# Patient Record
Sex: Male | Born: 1965
Health system: Southern US, Community
[De-identification: ages and names within clinical notes are randomized; demographics above are authoritative.]

## PROBLEM LIST (undated history)

## (undated) DIAGNOSIS — I10 Essential (primary) hypertension: Secondary | ICD-10-CM

## (undated) DIAGNOSIS — E785 Hyperlipidemia, unspecified: Secondary | ICD-10-CM

## (undated) DIAGNOSIS — I4891 Unspecified atrial fibrillation: Secondary | ICD-10-CM

## (undated) DIAGNOSIS — G709 Myoneural disorder, unspecified: Secondary | ICD-10-CM

## (undated) DIAGNOSIS — K589 Irritable bowel syndrome without diarrhea: Secondary | ICD-10-CM

## (undated) DIAGNOSIS — G43909 Migraine, unspecified, not intractable, without status migrainosus: Secondary | ICD-10-CM

## (undated) DIAGNOSIS — K219 Gastro-esophageal reflux disease without esophagitis: Secondary | ICD-10-CM

## (undated) DIAGNOSIS — I639 Cerebral infarction, unspecified: Secondary | ICD-10-CM

## (undated) DIAGNOSIS — T7840XA Allergy, unspecified, initial encounter: Secondary | ICD-10-CM

## (undated) HISTORY — DX: Irritable bowel syndrome, unspecified: K58.9

## (undated) HISTORY — DX: Allergy, unspecified, initial encounter: T78.40XA

## (undated) HISTORY — PX: CARDIAC CATHETERIZATION: SHX172

## (undated) HISTORY — DX: Unspecified atrial fibrillation: I48.91

## (undated) HISTORY — DX: Migraine, unspecified, not intractable, without status migrainosus: G43.909

## (undated) HISTORY — DX: Myoneural disorder, unspecified: G70.9

## (undated) HISTORY — DX: Gastro-esophageal reflux disease without esophagitis: K21.9

## (undated) HISTORY — DX: Essential (primary) hypertension: I10

## (undated) HISTORY — DX: Hyperlipidemia, unspecified: E78.5

---

## 2000-12-20 ENCOUNTER — Encounter: Payer: Self-pay | Admitting: Emergency Medicine

## 2000-12-21 ENCOUNTER — Observation Stay (HOSPITAL_COMMUNITY): Admission: EM | Admit: 2000-12-21 | Discharge: 2000-12-22 | Payer: Self-pay | Admitting: Cardiology

## 2000-12-22 ENCOUNTER — Encounter: Payer: Self-pay | Admitting: Internal Medicine

## 2002-08-04 ENCOUNTER — Encounter: Payer: Self-pay | Admitting: Family Medicine

## 2002-08-04 ENCOUNTER — Ambulatory Visit (HOSPITAL_COMMUNITY): Admission: RE | Admit: 2002-08-04 | Discharge: 2002-08-04 | Payer: Self-pay | Admitting: Family Medicine

## 2003-04-13 ENCOUNTER — Ambulatory Visit (HOSPITAL_COMMUNITY): Admission: RE | Admit: 2003-04-13 | Discharge: 2003-04-13 | Payer: Self-pay | Admitting: Family Medicine

## 2003-06-02 ENCOUNTER — Inpatient Hospital Stay (HOSPITAL_COMMUNITY): Admission: EM | Admit: 2003-06-02 | Discharge: 2003-06-03 | Payer: Self-pay | Admitting: Emergency Medicine

## 2003-07-03 ENCOUNTER — Ambulatory Visit (HOSPITAL_COMMUNITY): Admission: RE | Admit: 2003-07-03 | Discharge: 2003-07-03 | Payer: Self-pay | Admitting: *Deleted

## 2004-10-30 ENCOUNTER — Inpatient Hospital Stay (HOSPITAL_COMMUNITY): Admission: EM | Admit: 2004-10-30 | Discharge: 2004-10-31 | Payer: Self-pay | Admitting: Emergency Medicine

## 2007-03-12 ENCOUNTER — Ambulatory Visit (HOSPITAL_COMMUNITY): Admission: RE | Admit: 2007-03-12 | Discharge: 2007-03-12 | Payer: Self-pay | Admitting: Family Medicine

## 2010-07-26 NOTE — Discharge Summary (Signed)
[place]  Patient:    Frank Silva, Frank Silva Visit Number: [id number] MRN: [id number]          Service Type: EMS Location: ED Attending Physician:  Herbert Seta Dictated by:   Lavella Hammock, P.A.-C. Admit Date:  12/20/2000 Discharge Date: 12/20/2000   CC:         Dr. Patrica Duel   Referring Physician Discharge [place]  DATE OF BIRTH:  Jul 13, 1965  PROCEDURES:  GXT Cardiolite.  HOSPITAL COURSE:  Mr. Sorbo is a 44 year old male with no known history of coronary artery disease who went to [facility] for orthostatic hypotension and substernal chest pain.  His EKG had no significant abnormalities but his symptoms were concerning for a cardiac origin, so he was transferred to [facility] for further evaluation and treatment.  He was found to be dehydrated with positive orthostatic vital signs and he was hydrated.  He had no further episodes of presyncope once he was hydrated and his orthostatics were negative.  Because of his chest pain, he was ruled out for an MI and his enzymes were negative.  He was scheduled for a stress Cardiolite which was done on December 22, 2000.  The patient exercised well and his Cardiolite showed no scar, no ischemia, and an EF of 61%.  Additionally, the patient was counselled about tobacco cessation.  He was very motivated to do this.  He stated he would also try to improve his eating habits.  The patient stated that because of his work he went for long periods just smoking without anything to eat or drink.  By December 22, 2000 the patients cholesterol profile had been checked and was within normal limits, and the patient was feeling much better.  He was considered stable for discharge on December 22, 2000.  LABORATORY DATA:  Stress Cardiolite:  No defects seen on the stress-only images to suggest prior infarction or ischemia with an EF of 61%.  Chest x-ray:  No acute abnormalities.  Laboratory  values:  Hemoglobin 15.1, hematocrit 45.5, wbcs 9.2, platelets 208.  Sodium 141, potassium 3.9, chloride 110, CO2 26, BUN 14, creatinine 1.2, glucose 107.  Serial CK-MB and troponin I negative for MI.  Total cholesterol 129, triglycerides 84, HDL 45, LDL 67.  TSH 0.817.  DISCHARGE CONDITION:  Improved.  CONSULTS:  None.  COMPLICATIONS:  None.  DISCHARGE DIAGNOSES: 1. Chest pain, associated with orthostatic symptoms. 2. Presyncope, orthostatic. 3. Dehydration. 4. Borderline hypokalemia, supplemented. 5. History of surgery secondary to a chainsaw accident on his left leg. 6. History of frequent premature ventricular contractions.  DISCHARGE INSTRUCTIONS:  ACTIVITY:  His activity level was not restricted.  DIET:  He is to stick to a low fat diet and drink eight glasses of water a day.  FOLLOW-UP:  He is to follow up with cardiology on a p.r.n. basis and to follow up with Dr. Nobie Putnam and call for an appointment.  DISCHARGE MEDICATIONS:  Multivitamin q.d. Dictated by:   Lavella Hammock, P.A.-C. Attending Physician:  Herbert Seta DD:  12/22/00 TD:  12/22/00 Job: [id number]

## 2010-07-26 NOTE — H&P (Signed)
NAME:  Frank Silva, Frank Silva               ACCOUNT NO.:  [id number]   MEDICAL RECORD NO.:  [id number]          PATIENT TYPE:  INP   LOCATION:  IC10                          FACILITY:  [facility]   PHYSICIAN:  Madelin Rear. Sherwood Gambler, MD  DATE OF BIRTH:  08/03/1965   DATE OF ADMISSION:  10/30/2004  DATE OF DISCHARGE:  LH                                HISTORY & PHYSICAL   CHIEF COMPLAINT:  Palpitations.   HISTORY OF PRESENT ILLNESS:  The patient presented to the emergency  department after sustaining some nausea and vomiting x3 at home.  This was  accompanied by a cold sweat, but no syncope.  He had no hematemesis,  hematochezia, melena or diarrhea except for just one loose stool.  He states  he suspects possible food poisoning as he ingested a hot dog at some  roadside place.  He is unaware of anybody else getting ill eating the same  foods.  He was not exposed to anybody with an active gastroenteritis that he  is aware of.   PAST MEDICAL HISTORY:  Status post recent cardiac catheterization with  normal coronary arteries for palpitations as well as unequivocal stress  Cardiolite study in the past.  He has had recurrent palpitations, although  apparently no arrhythmias were uncovered during his observation.  He was  maintained on aspirin therapy.  Past medical history is otherwise  noncontributory.   SOCIAL HISTORY:  He had stopped cigarette smoking approximately 1-2 years  ago per advise of his physician.  He has avoided caffeine and other cardiac  stimulant.  He is married and lives with his wife.  No illicit drug use.  Only occasional alcohol and no heavy use.   FOLLOW UP:  Noncontributory.   REVIEW OF SYSTEMS:  As under HPI.   PHYSICAL EXAMINATION:  SKIN:  Unremarkable.  HEENT:  No JVD or adenopathy.  NECK:  Supple.  CHEST:  Clear.  CARDIAC:  Irregularly, irregular rhythm without murmurs, rubs or gallops.  ABDOMEN:  Soft, no organomegaly or mass.  No rebound tenderness.  EXTREMITIES:   Without clubbing, cyanosis or edema.  NEUROLOGIC:  Nonfocal.   LABORATORY DATA AND X-RAY FINDINGS:  A 12-lead EKG performed in the  emergency department reveals rapid atrial fibrillation, left axis deviation  and complete right bundle branch block, but no acute ischemic or infarctive  changes.   Cardiac enzymes were negative.  Admission electrolytes revealed mild  hyponatremia, mild hypokalemia.  Apparently, a thyroid panel was not  obtained and ordered this morning and pending at present.   IMPRESSION:  New-onset atrial fibrillation, apparently paroxysmal with  normal coronary arteries and normal myocardium.  Anticoagulation is probably  not indicated under these circumstances, although the patient did receive  Lovenox.   PLAN:  Will consult cardiology.  He was admitted for rate control on calcium  channel drip (Cardizem).  Thyroid panel will be obtained.  Observation and  ICU to continue while on a Cardizem drip.      Madelin Rear. Sherwood Gambler, MD  Electronically Signed     LJF/MEDQ  D:  10/30/2004  T:  10/30/2004  Job:  [id number]

## 2010-07-26 NOTE — Discharge Summary (Signed)
NAME:  Frank Silva, Frank Silva                         ACCOUNT NO.:  [id number]   MEDICAL RECORD NO.:  [id number]                   PATIENT TYPE:  INP   LOCATION:  A214                                 FACILITY:  [facility]   PHYSICIAN:  Kirk Ruths, M.D.            DATE OF BIRTH:  27-Sep-1965   DATE OF ADMISSION:  06/02/2003  DATE OF DISCHARGE:  06/03/2003                                 DISCHARGE SUMMARY   DISCHARGE DIAGNOSES:  1. Palpitations.  2. EKG abnormality.   HOSPITAL COURSE:  This 45 year old white male smoker awoke the morning of  admission with a sick feeling with some minor palpitations. The patient  just did not feel right and presented to the emergency room where he was  found to have an occasional PVC on monitor, but EKG showed a Q wave in lead  I which was new from his previous EKGs; the last being May of 2004.  It was  felt possibly that this was a lead placement abnormality, but the patient  was admitted for a rule out MI and serial enzymes.  Chest x-ray was  negative.  The patient had no further palpitations or discomfort.  His  serial enzymes were negative x3.   The patient's repeat EKG on the following day had returned to his previous  pattern, which confirms in my mind that this was a lead placement  aberration; nevertheless, the patient has risk factors for heart disease.  He will be arranged for outpatient stress Cardiolite next week.  He is  stable at the time of discharge.  He is discharged home on his aspirin a day  and to quit smoking.     ___________________________________________                                         Kirk Ruths, M.D.   WMM/MEDQ  D:  06/03/2003  T:  06/04/2003  Job:  [id number]

## 2010-07-26 NOTE — Discharge Summary (Signed)
NAME:  LENIX, KIDD               ACCOUNT NO.:  [id number]   MEDICAL RECORD NO.:  [id number]          PATIENT TYPE:  INP   LOCATION:  IC10                          FACILITY:  [facility]   PHYSICIAN:  Madelin Rear. Sherwood Gambler, MD  DATE OF BIRTH:  [birth year]   DATE OF ADMISSION:  10/30/2004  DATE OF DISCHARGE:  08/24/2006LH                                 DISCHARGE SUMMARY   DISCHARGE MEDICATIONS:  1.  Cardizem CD 120 mg p.o. daily.  2.  Aspirin 81 mg p.o. daily.  3.  Prilosec 20 mg p.o. b.i.d.   DISCHARGE DIAGNOSES:  1.  Atrial fibrillation.  2.  Hypo-osmolality.  3.  Hypokalemia.   SUMMARY:  Patient was admitted for presentation to the emergency department  after sustaining nausea/vomiting x3, this was accompanied by diaphoresis, no  syncope.  He was aware of some palpitations at that time as well.  Past  medical history is pertinent for cardiac cath with normal coronary arteries  which was part of his arrhythmia workup as well as an unequivocal stress  Cardiolite study in the past.  He was found to be in new onset atrial  fibrillation, apparently paroxysmal.  Anticoagulation was not indicated  secondary to lack of comorbidities.  He was seen by cardiology.  He  converted spontaneously to sinus rhythm and remained asymptomatic with  normal cardiac enzymes.  Discharge condition stable.  Follow up in office.      Madelin Rear. Sherwood Gambler, MD  Electronically Signed     LJF/MEDQ  D:  11/10/2004  T:  11/10/2004  Job:  [id number]

## 2010-07-26 NOTE — H&P (Signed)
NAME:  Frank Silva, Frank Silva                         ACCOUNT NO.:  [id number]   MEDICAL RECORD NO.:  [id number]                   PATIENT TYPE:  INP   LOCATION:  A214                                 FACILITY:  [facility]   PHYSICIAN:  Kirk Ruths, M.D.            DATE OF BIRTH:  30-Jan-1966   DATE OF ADMISSION:  06/02/2003  DATE OF DISCHARGE:                                HISTORY & PHYSICAL   CHIEF COMPLAINT:  Palpitations.   HISTORY OF PRESENT ILLNESS:  This is a 45 year old white male smoker who  awoke this morning with not feeling well, complaining of palpitations.  The  patient subsequently had some diarrhea and felt weak.  The patient presented  to the emergency room where a chest x-ray was normal.  He was count to have  occasional PAC's.  He was found to have some slight changes in his EKG,  specifically Q wave in 1 which is new.  I feel like there is a lead  placement aberration, but with the patient's history and smoking history,  family history, I elected to admit the patient for rule out myocardial  infarction.   PAST MEDICAL HISTORY:  The patient had a three-day hospitalization at [facility] in 2002 for rule out myocardial infarction with normal stress  Cardiolites at that time.  The patient has continued to smoke.   MEDICATIONS:  He only takes an aspirin daily.   ALLERGIES:  No known allergies.   REVIEW OF SYSTEMS:  He denies nausea, vomiting, diaphoresis, shortness of  breath, chest pain.   PHYSICAL EXAMINATION:  GENERAL: A well-developed, well-nourished white male,  in no acute distress.  VITAL SIGNS:  He is afebrile.  Blood pressure 130/90, respirations 20 and  unlabored.  HEENT:  TM's are normal.  Pupils equal to light and accommodation.  Oropharynx is benign.  NECK:  Supple without JVD, bruits or thyromegaly.  LUNGS:  Clear in all areas.  HEART:  Regular sinus rhythm without murmur, gallop or rub.  ABDOMEN:  Soft, nontender.  EXTREMITIES:  Without  clubbing, cyanosis or edema.  NEUROLOGIC EXAMINATION:  Grossly intact.   ASSESSMENT:  1. Palpitations.  2. EKG change, probably secondary to lead placement.  3. Smoking history.  4. Family history of cardiac disease.    ___________________________________________                                         Kirk Ruths, M.D.   WMM/MEDQ  D:  06/02/2003  T:  06/02/2003  Job:  [id number]

## 2010-07-26 NOTE — Cardiovascular Report (Signed)
NAME:  Frank Silva, Frank Silva                         ACCOUNT NO.:  [id number]   MEDICAL RECORD NO.:  [id number]                   PATIENT TYPE:  OIB   LOCATION:  2899                                 FACILITY:  [facility]   PHYSICIAN:  Darlin Priestly, M.D.             DATE OF BIRTH:  06/24/1965   DATE OF PROCEDURE:  07/03/2003  DATE OF DISCHARGE:                              CARDIAC CATHETERIZATION   PROCEDURES:  1. Left heart catheterization.  2. Coronary angiography.  3. Left ventriculogram.   ATTENDING PHYSICIAN:  Darlin Priestly, M.D.   COMPLICATIONS:  None.   INDICATIONS:  Mr. Hyams is a 45 year old male patient of Dr. Regino Schultze with  history of remote tobacco use and alcohol use with intermittent  palpitations.  He ultimately was seen in the emergency room with normal EKG.  However, he did undergo a treadmill Cardiolite on June 13, 2003 with  evidence of mild anterior wall ischemia with apical thinning and normal EF.  He is now referred for cardiac catheterization to definitively rule out  coronary artery disease.   DESCRIPTION OF OPERATION:  After obtaining informed written consent, the  patient was brought to the cardiac catheterization laboratory.  Right and  left groin were  shaved and prepped and draped in the usual sterile fashion.  ECG monitor was established.  Using the modified Seldinger technique, a 6  French arterial sheath was inserted in the right femoral artery. A 6 French  diagnostic catheter was then used to performed diagnostic angiography.  This  reveals a large left main with no significant disease.  The LAD is a large  vessel that coursed to the apex and gives rise to one diagonal branch and  one large septal perforator.  The LAD has no significant disease.  The first  diagonal is a medium-size vessel with no significant disease.   Left circumflex is a large vessel which courses in AV groove and goes to two  obtuse marginal branches as well as a PDA and is  noted to be co-dominant.  The AV groove circumflex has no significant disease.   The first OM is a large vessel which bifurcates distally and has no  significant disease.  The second OM is a medium size vessel with no  significant disease.   The PDA is a medium size vessel with no significant disease.  in the AV  groove circ.   The right coronary artery is a medium size vessel which is noted to be co-  dominant and gives rise to a small PDA and posterior lateral branch.  There  is no significant disease in the RCA, PDA and posterior lateral branch.   LEFT VENTRICULOGRAM:  Left ventriculogram reveals preserved EF of 60%.   HEMODYNAMICS:  Systemic arterial pressure 139/72, LV pressure 130/11, LVEDP  of 24.   CONCLUSIONS:  1. No significant coronary artery disease.  2. Normal left ventricular systolic function.  3.  Elevated LVEDP.                                               Darlin Priestly, M.D.    RHM/MEDQ  D:  07/03/2003  T:  07/03/2003  Job:  [id number]   cc:   Kirk Ruths, M.D.  [address]  [place]  [place] [address]  Fax: [phone]

## 2011-08-05 ENCOUNTER — Telehealth: Payer: Self-pay | Admitting: Family Medicine

## 2011-08-05 ENCOUNTER — Ambulatory Visit: Payer: Self-pay | Admitting: Family Medicine

## 2011-08-05 VITALS — BP 160/79 | HR 69 | Temp 97.7°F | Resp 18 | Ht 68.0 in | Wt 168.0 lb

## 2011-08-05 DIAGNOSIS — M25519 Pain in unspecified shoulder: Secondary | ICD-10-CM

## 2011-08-05 MED ORDER — TRAMADOL HCL 50 MG PO TABS: 100.0000 mg | ORAL_TABLET | Freq: Four times a day (QID) | ORAL | Status: DC | PRN

## 2011-08-05 MED ORDER — TRAMADOL HCL 50 MG PO TABS: 50.0000 mg | ORAL_TABLET | Freq: Three times a day (TID) | ORAL | Status: DC | PRN

## 2011-08-05 NOTE — Telephone Encounter (Signed)
Dr. Georgiana Shore,  Patient called back stating that he normally takes several Tramadol at a time instead of 1 every 8 hours.  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  Can he take more than the rx'd amount?  Please advise.

## 2011-08-05 NOTE — Telephone Encounter (Signed)
Changed as requested and ndew rx sent

## 2011-08-05 NOTE — Telephone Encounter (Signed)
Patient would like a call back from Dr. Georgiana Shore regarding his rx that was written for Tramadol. He states he would like for it to be changed to take 1-2 every 4 hours instead of twice a day.

## 2011-08-05 NOTE — Progress Notes (Signed)
  Subjective:    Patient ID: Frank Silva, male    DOB: [birth year], 46 y.o.   MRN: [id number]  HPI 46 yo male here with shoulder pain. H/O injury to left rhomboids/periscapular muscles.  Flares every now and then.  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  Cant take vicodin, celebrex or flexeril.  Tramadol has helped well in the past.    Review of Systems Negative except as per HPI     Objective:   Physical Exam  Constitutional: He appears well-developed.  Pulmonary/Chest: Effort normal.  Musculoskeletal:       TTP left rhomboids. FroM shoulder.   Neurological: He is alert.          Assessment & Plan:  Shoulder pain - tramadol.  If no improvement, consider prednisone.

## 2011-08-07 NOTE — Telephone Encounter (Signed)
LMOM that Rx was changed and sent to pharm

## 2011-12-15 ENCOUNTER — Ambulatory Visit: Payer: Self-pay | Admitting: Family Medicine

## 2011-12-15 VITALS — BP 128/74 | HR 72 | Temp 98.0°F | Resp 16 | Ht 68.0 in | Wt 167.0 lb

## 2011-12-15 DIAGNOSIS — M546 Pain in thoracic spine: Secondary | ICD-10-CM

## 2011-12-15 DIAGNOSIS — M549 Dorsalgia, unspecified: Secondary | ICD-10-CM

## 2011-12-15 MED ORDER — TRAMADOL HCL 50 MG PO TABS: 100.0000 mg | ORAL_TABLET | Freq: Four times a day (QID) | ORAL | Status: DC | PRN

## 2011-12-15 MED ORDER — CYCLOBENZAPRINE HCL 5 MG PO TABS: 5.0000 mg | ORAL_TABLET | Freq: Every evening | ORAL | Status: DC | PRN

## 2011-12-15 NOTE — Patient Instructions (Signed)
Thoracic Strain  You have injured the muscles or tendons that attach to the upper part of your back behind your chest. This injury is called a thoracic strain, thoracic sprain, or mid-back strain.   CAUSES   The cause of thoracic strain varies. A less severe injury involves pulling a muscle or tendon without tearing it. A more severe injury involves tearing (rupturing) a muscle or tendon. With less severe injuries, there may be little loss of strength. Sometimes, there are breaks (fractures) in the bones to which the muscles are attached. These fractures are rare, unless there was a direct hit (trauma) or you have weak bones due to osteoporosis or age. Longstanding strains may be caused by overuse or improper form during certain movements. Obesity can also increase your risk for back injuries. Sudden strains may occur due to injury or not warming up properly before exercise. Often, there is no obvious cause for a thoracic strain.  SYMPTOMS   The main symptom is pain, especially with movement, such as during exercise.  DIAGNOSIS   Your caregiver can usually tell what is wrong by taking an X-ray and doing a physical exam.  TREATMENT    Physical therapy may be helpful for recovery. Your caregiver can give you exercises to do or refer you to a physical therapist after your pain improves.   After your pain improves, strengthening and conditioning programs appropriate for your sport or occupation may be helpful.   Always warm up before physical activities or athletics. Stretching after physical activity may also help.   Certain over-the-counter medicines may also help. Ask your caregiver if there are medicines that would help you.  If this is your first thoracic strain injury, proper care and proper healing time before starting activities should prevent long-term problems. Torn ligaments and tendons require as long to heal as broken bones. Average healing times may be only 1 week for a mild strain. For torn muscles  and tendons, healing time may be up to 6 weeks to 2 months.  HOME CARE INSTRUCTIONS    Apply ice to the injured area. Ice massages may also be used as directed.   Put ice in a plastic bag.   Place a towel between your skin and the bag.   Leave the ice on for 15 to 20 minutes, 3 to 4 times a day, for the first 2 days.   Only take over-the-counter or prescription medicines for pain, discomfort, or fever as directed by your caregiver.   Keep your appointments for physical therapy if this was prescribed.   Use wraps and back braces as instructed.  SEEK IMMEDIATE MEDICAL CARE IF:    You have an increase in bruising, swelling, or pain.   Your pain has not improved with medicines.   You develop new shortness of breath, chest pain, or fever.   Problems seem to be getting worse rather than better.  MAKE SURE YOU:    Understand these instructions.   Will watch your condition.   Will get help right away if you are not doing well or get worse.  Document Released: 05/17/2003 Document Revised: 05/19/2011 Document Reviewed: 04/12/2010  [employer] Patient Information 2013 [employer].

## 2011-12-15 NOTE — Progress Notes (Signed)
This is a 46 y.o.male who injured upper left back after throwing softball a week ago.  He drives a truck.    Patient has a past history of upper back pain for which she   Frank Silva denies any urinary symptoms, bowel problems, numbness in the legs, loss of motor power. Frank Silva had no fever.  Frank Silva has tried advil.  No past medical history on file.   No past surgical history on file.  Objective:  middle-aged male in no acute distress. Blood pressure 128/74, pulse 72, temperature 98 F (36.7 C), temperature source Oral, resp. rate 16, height 5\' 8"  (1.727 m), weight 167 lb (75.751 kg).Body mass index is 25.39 kg/(m^2). Palpation of the back reveals   Inspection of the back: Reveals no scoliosis  Motor exam of lower extremity: No abnormal weakness.  Reflexes: Symmetric and normal  Skin exam: normal  Assessment/Plan: Acute upper back pain without acute neurological findings.

## 2011-12-19 ENCOUNTER — Telehealth: Payer: Self-pay

## 2011-12-19 ENCOUNTER — Ambulatory Visit (INDEPENDENT_AMBULATORY_CARE_PROVIDER_SITE_OTHER): Payer: Self-pay | Admitting: Family Medicine

## 2011-12-19 VITALS — BP 138/80 | HR 68 | Temp 97.7°F | Resp 16 | Ht 68.0 in | Wt 167.0 lb

## 2011-12-19 DIAGNOSIS — M549 Dorsalgia, unspecified: Secondary | ICD-10-CM

## 2011-12-19 DIAGNOSIS — M545 Low back pain, unspecified: Secondary | ICD-10-CM

## 2011-12-19 NOTE — Telephone Encounter (Signed)
I would advise that the patient first have the dog evaluated urgently by a veterinarian. There are several 24 hour office locally. (TL please tell patient that.)  Per Dr. Milus Glazier we are not refilling the tramadol.

## 2011-12-19 NOTE — Telephone Encounter (Signed)
Please advise if we can replace this for him.

## 2011-12-19 NOTE — Telephone Encounter (Signed)
Patent states that tramadol was torn apart by his dog. Request refill. States he is a [employer] and will be going out of town tomorrow so would like it done before then if possible. [phone], [phone]

## 2011-12-19 NOTE — Progress Notes (Signed)
46 yo with back pain.  Lost Rx for tramadol  Rx refilled

## 2011-12-20 ENCOUNTER — Telehealth: Payer: Self-pay | Admitting: Radiology

## 2011-12-20 NOTE — Telephone Encounter (Signed)
Called patient left VM and advised we can not replace this medication for him.

## 2011-12-20 NOTE — Telephone Encounter (Signed)
Patient called back, advised Dr L has already replaced the Tramadol Rx for him.

## 2012-01-02 ENCOUNTER — Other Ambulatory Visit: Payer: Self-pay | Admitting: Family Medicine

## 2012-02-13 ENCOUNTER — Other Ambulatory Visit: Payer: Self-pay | Admitting: Physician Assistant

## 2012-02-16 ENCOUNTER — Other Ambulatory Visit: Payer: Self-pay | Admitting: Family Medicine

## 2012-03-15 ENCOUNTER — Encounter (HOSPITAL_COMMUNITY): Payer: Self-pay

## 2012-03-15 ENCOUNTER — Emergency Department (INDEPENDENT_AMBULATORY_CARE_PROVIDER_SITE_OTHER)
Admission: EM | Admit: 2012-03-15 | Discharge: 2012-03-15 | Disposition: A | Discharge status: Home / Self Care | Payer: Self-pay | Source: Home / Self Care

## 2012-03-15 ENCOUNTER — Emergency Department (INDEPENDENT_AMBULATORY_CARE_PROVIDER_SITE_OTHER): Payer: Self-pay

## 2012-03-15 DIAGNOSIS — J209 Acute bronchitis, unspecified: Secondary | ICD-10-CM

## 2012-03-15 DIAGNOSIS — J069 Acute upper respiratory infection, unspecified: Secondary | ICD-10-CM

## 2012-03-15 DIAGNOSIS — F17200 Nicotine dependence, unspecified, uncomplicated: Secondary | ICD-10-CM

## 2012-03-15 DIAGNOSIS — R05 Cough: Secondary | ICD-10-CM

## 2012-03-15 DIAGNOSIS — R059 Cough, unspecified: Secondary | ICD-10-CM

## 2012-03-15 LAB — CBC WITH DIFFERENTIAL/PLATELET
Basophils Absolute: 0 10*3/uL (ref 0.0–0.1)
Basophils Relative: 0 % (ref 0–1)
Eosinophils Absolute: 0.1 10*3/uL (ref 0.0–0.7)
Eosinophils Relative: 1 % (ref 0–5)
HCT: 47.1 % (ref 39.0–52.0)
Hemoglobin: 16.6 g/dL (ref 13.0–17.0)
Lymphocytes Relative: 35 % (ref 12–46)
Lymphs Abs: 2 10*3/uL (ref 0.7–4.0)
MCH: 32.5 pg (ref 26.0–34.0)
MCHC: 35.2 g/dL (ref 30.0–36.0)
MCV: 92.2 fL (ref 78.0–100.0)
Monocytes Absolute: 1 10*3/uL (ref 0.1–1.0)
Monocytes Relative: 17 % — ABNORMAL HIGH (ref 3–12)
Neutro Abs: 2.6 10*3/uL (ref 1.7–7.7)
Neutrophils Relative %: 46 % (ref 43–77)
Platelets: 136 10*3/uL — ABNORMAL LOW (ref 150–400)
RBC: 5.11 MIL/uL (ref 4.22–5.81)
RDW: 13 % (ref 11.5–15.5)
WBC: 5.7 10*3/uL (ref 4.0–10.5)

## 2012-03-15 MED ORDER — GUAIFENESIN-CODEINE 100-10 MG/5ML PO SYRP: 5.0000 mL | ORAL_SOLUTION | Freq: Three times a day (TID) | ORAL | Status: DC | PRN

## 2012-03-15 MED ORDER — PREDNISONE 10 MG PO TABS: ORAL_TABLET | ORAL | Status: DC

## 2012-03-15 MED ORDER — ALBUTEROL SULFATE HFA 108 (90 BASE) MCG/ACT IN AERS: 2.0000 | INHALATION_SPRAY | Freq: Four times a day (QID) | RESPIRATORY_TRACT | Status: DC | PRN

## 2012-03-15 MED ORDER — ALBUTEROL SULFATE (5 MG/ML) 0.5% IN NEBU
2.5000 mg | INHALATION_SOLUTION | Freq: Once | RESPIRATORY_TRACT | Status: AC
  Administered 2012-03-15: 2.5 mg via RESPIRATORY_TRACT

## 2012-03-15 MED ORDER — AMOXICILLIN-POT CLAVULANATE 875-125 MG PO TABS: 1.0000 | ORAL_TABLET | Freq: Two times a day (BID) | ORAL | Status: DC

## 2012-03-15 MED ORDER — ALBUTEROL SULFATE (5 MG/ML) 0.5% IN NEBU
INHALATION_SOLUTION | RESPIRATORY_TRACT | Status: AC
  Filled 2012-03-15: qty 0.5

## 2012-03-15 MED ORDER — IPRATROPIUM BROMIDE 0.02 % IN SOLN
0.5000 mg | Freq: Once | RESPIRATORY_TRACT | Status: AC
  Administered 2012-03-15: 0.5 mg via RESPIRATORY_TRACT

## 2012-03-15 NOTE — ED Provider Notes (Signed)
History   CSN: [id number]  Arrival date & time 03/15/12  1734  Chief Complaint  Patient presents with  . URI   The history is provided by the patient. No language interpreter was used.   Pt having chronic cough and chest congestion.  Cough has been nonproductive.  Pt has been wheezing and coughing.  Pt having chills but not much fever.  Pt has history of reactive airway disease.  Pt is a smoker but hasn't smoked in several days.    History reviewed. No pertinent past medical history.  History reviewed. No pertinent past surgical history.  No family history on file.  History  Substance Use Topics  . Smoking status: Current Every Day Smoker    Types: Cigarettes  . Smokeless tobacco: Not on file  . Alcohol Use: No    Review of Systems  Respiratory: Positive for cough, chest tightness, shortness of breath and wheezing. Negative for apnea, choking and stridor.   All other systems reviewed and are negative.   Allergies  Flexeril; Vicodin; and Celebrex  Home Medications   Current Outpatient Rx  Name  Route  Sig  Dispense  Refill  . CYCLOBENZAPRINE HCL 5 MG PO TABS   Oral   Take 1 tablet (5 mg total) by mouth at bedtime and may repeat dose one time if needed.   30 tablet   1   . DILTIAZEM HCL 120 MG PO TABS   Oral   Take 120 mg by mouth daily.         . ASPIRIN 81 MG PO TABS   Oral   Take 81 mg by mouth daily.         . TRAMADOL HCL 50 MG PO TABS      TAKE TWO TABLETS BY MOUTH EVERY 6 HOURS AS NEEDED FOR PAIN   60 tablet   0    BP 157/96  Pulse 84  Temp 98.9 F (37.2 C) (Oral)  Resp 18  SpO2 95%  Physical Exam  Nursing note and vitals reviewed. Constitutional: He is oriented to person, place, and time. He appears well-developed and well-nourished. No distress.  HENT:  Head: Normocephalic and atraumatic.  Mouth/Throat: Oropharyngeal exudate present.  Eyes: EOM are normal. Pupils are equal, round, and reactive to light.  Neck: Normal range of motion.  Neck supple.  Cardiovascular: Normal rate, regular rhythm and normal heart sounds.   Pulmonary/Chest: Effort normal. No respiratory distress. He has wheezes. He has rales. He exhibits tenderness.  Abdominal: Soft. Bowel sounds are normal.  Musculoskeletal: Normal range of motion. He exhibits no edema.  Neurological: He is alert and oriented to person, place, and time.  Skin: Skin is warm and dry.  Psychiatric: He has a normal mood and affect. His behavior is normal. Judgment and thought content normal.   ED Course  Procedures (including critical care time)  Labs Reviewed - No data to display No results found.  No diagnosis found.  MDM  IMPRESSION  URI  Possible Pneumonia  HTN   History of cardiac arrhythmia  Pleuritic Chest Pain  Cough (nonproductive)  RECOMMENDATIONS / PLAN Chest Xray PA/Lateral: reviewed xray and saw radiologist interpretation : consistent with acute bronchitis; no pneumonia seen Strongly encouraged smoking cessation Albuterol HFA - 2 puffs every 3 hours prn cough and wheeze Augmentin 875 mg po BID x 10 days Check CBC with diff Prednisone 10 mg take 3 tabs po daily for 7 days Robitussin AC cough syrup prn cough congestion  FOLLOW  UP 2 weeks   The patient was given clear instructions to go to ER or return to medical center if symptoms don't improve, worsen or new problems develop.  The patient verbalized understanding.  The patient was told to call to get lab results if they haven't heard anything in the next week.            Cleora Fleet, MD 03/15/12 1902

## 2012-03-15 NOTE — ED Notes (Signed)
Complains of congestion, cough shortness of breath. No appetite chills at times . Ribs are sore.

## 2012-03-17 ENCOUNTER — Telehealth (HOSPITAL_COMMUNITY): Payer: Self-pay

## 2012-04-23 ENCOUNTER — Ambulatory Visit: Payer: Self-pay | Admitting: Emergency Medicine

## 2012-04-23 VITALS — BP 136/77 | HR 96 | Temp 97.8°F | Resp 18 | Ht 68.0 in | Wt 172.2 lb

## 2012-04-23 DIAGNOSIS — G5680 Other specified mononeuropathies of unspecified upper limb: Secondary | ICD-10-CM

## 2012-04-23 MED ORDER — NAPROXEN SODIUM 550 MG PO TABS: 550.0000 mg | ORAL_TABLET | Freq: Two times a day (BID) | ORAL | Status: AC

## 2012-04-23 MED ORDER — METAXALONE 800 MG PO TABS: 800.0000 mg | ORAL_TABLET | Freq: Three times a day (TID) | ORAL | Status: DC

## 2012-04-23 MED ORDER — TRAMADOL HCL 50 MG PO TABS: ORAL_TABLET | ORAL | Status: DC

## 2012-04-23 NOTE — Progress Notes (Signed)
[facility] [address] [place] [address] [phone]  Date:  04/23/2012   Name:  Frank Silva   DOB:  May 12, 1965   MRN:  [id number]  PCP:  No primary provider on file.    Chief Complaint: Shoulder Pain and Back Pain   History of Present Illness:  Frank Silva is a 47 y.o. very pleasant male patient who presents with the following:  Injured back around left shoulder blade lowering the landing gear on his tractor trailer and has pain.  No direct injury.  No neurological symptoms or radiation.  No improvement in pain with OTC medication. Denies other complaints.    There is no problem list on file for this patient.   Past Medical History  Diagnosis Date  . Allergy   . Neuromuscular disorder     History reviewed. No pertinent past surgical history.  History  Substance Use Topics  . Smoking status: Current Every Day Smoker    Types: Cigarettes  . Smokeless tobacco: Not on file  . Alcohol Use: No    No family history on file.  Allergies  Allergen Reactions  . Flexeril (Cyclobenzaprine) Other (See Comments)  . Vicodin (Hydrocodone-Acetaminophen) Rash  . Celebrex (Celecoxib)   . Peanut-Containing Drug Products Hives    Breathing     Medication list has been reviewed and updated.  Current Outpatient Prescriptions on File Prior to Visit  Medication Sig Dispense Refill  . albuterol (PROVENTIL HFA;VENTOLIN HFA) 108 (90 BASE) MCG/ACT inhaler Inhale 2 puffs into the lungs every 6 (six) hours as needed for wheezing.  1 Inhaler  2  . aspirin 81 MG tablet Take 81 mg by mouth daily.      Marland Kitchen diltiazem (CARDIZEM) 120 MG tablet Take 120 mg by mouth daily.      Marland Kitchen amoxicillin-clavulanate (AUGMENTIN) 875-125 MG per tablet Take 1 tablet by mouth 2 (two) times daily.  20 tablet  0  . guaiFENesin-codeine (ROBITUSSIN AC) 100-10 MG/5ML syrup Take 5 mLs by mouth 3 (three) times daily as needed for cough (caution may cause drowsiness).  120 mL  0  .  predniSONE (DELTASONE) 10 MG tablet Take 3 tabs po daily for 7 days  21 tablet  0  . traMADol (ULTRAM) 50 MG tablet TAKE TWO TABLETS BY MOUTH EVERY 6 HOURS AS NEEDED FOR PAIN  60 tablet  0   No current facility-administered medications on file prior to visit.    Review of Systems:  As per HPI, otherwise negative.    Physical Examination: Filed Vitals:   04/23/12 1447  BP: 136/77  Pulse: 96  Temp: 97.8 F (36.6 C)  Resp: 18   Filed Vitals:   04/23/12 1447  Height: 5\' 8"  (1.727 m)  Weight: 172 lb 3.2 oz (78.109 kg)   Body mass index is 26.19 kg/(m^2). Ideal Body Weight: Weight in (lb) to have BMI = 25: 164.1   GEN: WDWN, NAD, Non-toxic, Alert & Oriented x 3 HEENT: Atraumatic, Normocephalic.  Ears and Nose: No external deformity. EXTR: No clubbing/cyanosis/edema NEURO: Normal gait.  PSYCH: Normally interactive. Conversant. Not depressed or anxious appearing.  Calm demeanor.  BACK:  Tender medial to angle of scapula on left.  Moderate spasm.  Neuro intact Assessment and Plan: Scapulocostal syndrome Anaprox Flexeril Local heat  Carmelina Dane, MD

## 2014-04-30 ENCOUNTER — Inpatient Hospital Stay (HOSPITAL_COMMUNITY): Payer: BLUE CROSS/BLUE SHIELD

## 2014-04-30 ENCOUNTER — Inpatient Hospital Stay (HOSPITAL_COMMUNITY)
Admission: EM | Admit: 2014-04-30 | Discharge: 2014-05-01 | DRG: 066 | Disposition: A | Discharge status: Home / Self Care | Payer: BLUE CROSS/BLUE SHIELD | Attending: Internal Medicine | Admitting: Internal Medicine

## 2014-04-30 ENCOUNTER — Emergency Department (HOSPITAL_COMMUNITY): Payer: BLUE CROSS/BLUE SHIELD

## 2014-04-30 ENCOUNTER — Encounter (HOSPITAL_COMMUNITY): Payer: Self-pay | Admitting: *Deleted

## 2014-04-30 DIAGNOSIS — I6340 Cerebral infarction due to embolism of unspecified cerebral artery: Principal | ICD-10-CM | POA: Diagnosis present

## 2014-04-30 DIAGNOSIS — E669 Obesity, unspecified: Secondary | ICD-10-CM | POA: Diagnosis present

## 2014-04-30 DIAGNOSIS — R29810 Facial weakness: Secondary | ICD-10-CM | POA: Diagnosis not present

## 2014-04-30 DIAGNOSIS — I2510 Atherosclerotic heart disease of native coronary artery without angina pectoris: Secondary | ICD-10-CM | POA: Diagnosis present

## 2014-04-30 DIAGNOSIS — Z7982 Long term (current) use of aspirin: Secondary | ICD-10-CM

## 2014-04-30 DIAGNOSIS — I10 Essential (primary) hypertension: Secondary | ICD-10-CM | POA: Diagnosis present

## 2014-04-30 DIAGNOSIS — E119 Type 2 diabetes mellitus without complications: Secondary | ICD-10-CM | POA: Diagnosis present

## 2014-04-30 DIAGNOSIS — F17210 Nicotine dependence, cigarettes, uncomplicated: Secondary | ICD-10-CM | POA: Diagnosis present

## 2014-04-30 DIAGNOSIS — I4891 Unspecified atrial fibrillation: Secondary | ICD-10-CM | POA: Diagnosis present

## 2014-04-30 DIAGNOSIS — E785 Hyperlipidemia, unspecified: Secondary | ICD-10-CM | POA: Diagnosis present

## 2014-04-30 DIAGNOSIS — I6529 Occlusion and stenosis of unspecified carotid artery: Secondary | ICD-10-CM | POA: Diagnosis present

## 2014-04-30 DIAGNOSIS — I499 Cardiac arrhythmia, unspecified: Secondary | ICD-10-CM | POA: Diagnosis present

## 2014-04-30 DIAGNOSIS — I639 Cerebral infarction, unspecified: Secondary | ICD-10-CM | POA: Insufficient documentation

## 2014-04-30 DIAGNOSIS — Z7901 Long term (current) use of anticoagulants: Secondary | ICD-10-CM

## 2014-04-30 DIAGNOSIS — Z885 Allergy status to narcotic agent status: Secondary | ICD-10-CM | POA: Diagnosis not present

## 2014-04-30 DIAGNOSIS — Z9119 Patient's noncompliance with other medical treatment and regimen: Secondary | ICD-10-CM | POA: Diagnosis present

## 2014-04-30 DIAGNOSIS — Z6826 Body mass index (BMI) 26.0-26.9, adult: Secondary | ICD-10-CM | POA: Diagnosis not present

## 2014-04-30 LAB — COMPREHENSIVE METABOLIC PANEL
ALT: 20 U/L (ref 0–53)
AST: 21 U/L (ref 0–37)
Albumin: 4.1 g/dL (ref 3.5–5.2)
Alkaline Phosphatase: 67 U/L (ref 39–117)
Anion gap: 4 — ABNORMAL LOW (ref 5–15)
BUN: 12 mg/dL (ref 6–23)
CO2: 32 mmol/L (ref 19–32)
Calcium: 9.6 mg/dL (ref 8.4–10.5)
Chloride: 105 mmol/L (ref 96–112)
Creatinine, Ser: 1.15 mg/dL (ref 0.50–1.35)
GFR calc Af Amer: 84 mL/min — ABNORMAL LOW (ref >90)
GFR calc non Af Amer: 73 mL/min — ABNORMAL LOW (ref >90)
Glucose, Bld: 104 mg/dL — ABNORMAL HIGH (ref 70–99)
Potassium: 3.5 mmol/L (ref 3.5–5.1)
Sodium: 141 mmol/L (ref 135–145)
Total Bilirubin: 0.4 mg/dL (ref 0.3–1.2)
Total Protein: 6.9 g/dL (ref 6.0–8.3)

## 2014-04-30 LAB — CBC WITH DIFFERENTIAL/PLATELET
Basophils Absolute: 0.1 10*3/uL (ref 0.0–0.1)
Basophils Relative: 1 % (ref 0–1)
Eosinophils Absolute: 0.2 10*3/uL (ref 0.0–0.7)
Eosinophils Relative: 2 % (ref 0–5)
HCT: 47.7 % (ref 39.0–52.0)
Hemoglobin: 17 g/dL (ref 13.0–17.0)
Lymphocytes Relative: 36 % (ref 12–46)
Lymphs Abs: 3 10*3/uL (ref 0.7–4.0)
MCH: 32.8 pg (ref 26.0–34.0)
MCHC: 35.6 g/dL (ref 30.0–36.0)
MCV: 92.1 fL (ref 78.0–100.0)
Monocytes Absolute: 0.6 10*3/uL (ref 0.1–1.0)
Monocytes Relative: 7 % (ref 3–12)
Neutro Abs: 4.7 10*3/uL (ref 1.7–7.7)
Neutrophils Relative %: 54 % (ref 43–77)
Platelets: 207 10*3/uL (ref 150–400)
RBC: 5.18 MIL/uL (ref 4.22–5.81)
RDW: 12.8 % (ref 11.5–15.5)
WBC: 8.6 10*3/uL (ref 4.0–10.5)

## 2014-04-30 LAB — I-STAT TROPONIN, ED: Troponin i, poc: 0 ng/mL (ref 0.00–0.08)

## 2014-04-30 LAB — PROTIME-INR
INR: 0.97 (ref 0.00–1.49)
Prothrombin Time: 13 seconds (ref 11.6–15.2)

## 2014-04-30 MED ORDER — ENOXAPARIN SODIUM 40 MG/0.4ML ~~LOC~~ SOLN
40.0000 mg | Freq: Every day | SUBCUTANEOUS | Status: DC
  Administered 2014-04-30: 40 mg via SUBCUTANEOUS
  Filled 2014-04-30: qty 0.4

## 2014-04-30 MED ORDER — NICOTINE 14 MG/24HR TD PT24
14.0000 mg | MEDICATED_PATCH | Freq: Every day | TRANSDERMAL | Status: DC
  Filled 2014-04-30: qty 1

## 2014-04-30 MED ORDER — HYDRALAZINE HCL 20 MG/ML IJ SOLN: 10.0000 mg | Freq: Four times a day (QID) | INTRAMUSCULAR | Status: DC | PRN

## 2014-04-30 MED ORDER — ASPIRIN 325 MG PO TABS
325.0000 mg | ORAL_TABLET | Freq: Every day | ORAL | Status: DC
  Administered 2014-05-01: 325 mg via ORAL
  Filled 2014-04-30: qty 1

## 2014-04-30 MED ORDER — SODIUM CHLORIDE 0.9 % IV SOLN
INTRAVENOUS | Status: DC
  Administered 2014-04-30: 22:00:00 via INTRAVENOUS

## 2014-04-30 MED ORDER — ACETAMINOPHEN 325 MG PO TABS
650.0000 mg | ORAL_TABLET | ORAL | Status: DC | PRN
  Administered 2014-05-01: 650 mg via ORAL
  Filled 2014-04-30: qty 2

## 2014-04-30 MED ORDER — ASPIRIN 300 MG RE SUPP: 300.0000 mg | Freq: Every day | RECTAL | Status: DC

## 2014-04-30 MED ORDER — ACETAMINOPHEN 650 MG RE SUPP: 650.0000 mg | RECTAL | Status: DC | PRN

## 2014-04-30 MED ORDER — STROKE: EARLY STAGES OF RECOVERY BOOK
Freq: Once | Status: AC
  Administered 2014-04-30: 1

## 2014-04-30 NOTE — ED Provider Notes (Signed)
CSN: [id number]     Arrival date & time 04/30/14  1651 History   First MD Initiated Contact with Patient 04/30/14 1718     Chief Complaint  Patient presents with  . Numbness  . Facial Droop   (Consider location/radiation/quality/duration/timing/severity/associated sxs/prior Treatment) HPI Comments: 49 yo M hx of cardiac arrhythmia on Cardizem, presents with CC of R facial droop.  Pt states symptoms started 4:30-5:00 PM yesterday afternoon.  States he first noted during dinner when he kept biting his upper lip.  Pt states throughout the night and today he has noticed R facial droop, fatigue, some speech difficulty.  He had brief HA earlier today, which resolved on own.  He denies fever, chills, CP, SOB, abdominal pain, nausea, vomiting, diarrhea, rash, myalgias, or extremity paresthesias or weakness.  Pt denies prior occurrence.  Pt supposed to be on daily baby ASA, but does not take this routinely.  Pt has no cardiologist for his arrhythmia, last Rx for Cardizem was from a PA here at [facility].    The history is provided by the patient. No language interpreter was used.    Past Medical History  Diagnosis Date  . Allergy   . Neuromuscular disorder    History reviewed. No pertinent past surgical history. History reviewed. No pertinent family history. History  Substance Use Topics  . Smoking status: Current Every Day Smoker    Types: Cigarettes  . Smokeless tobacco: Not on file  . Alcohol Use: No    Review of Systems  Constitutional: Negative for fever and chills.  Respiratory: Negative for cough and shortness of breath.   Cardiovascular: Negative for chest pain.  Gastrointestinal: Negative for nausea, vomiting, abdominal pain, diarrhea and constipation.  Genitourinary: Negative for dysuria.  Musculoskeletal: Negative for myalgias.  Skin: Negative for rash.  Neurological: Positive for speech difficulty, numbness and headaches. Negative for dizziness, seizures, syncope, weakness and  light-headedness.       Facial droop  Hematological: Negative for adenopathy. Does not bruise/bleed easily.  All other systems reviewed and are negative.     Allergies  Vicodin; Celebrex; and Peanut-containing drug products  Home Medications   Prior to Admission medications   Medication Sig Start Date End Date Taking? Authorizing Provider  albuterol (PROVENTIL HFA;VENTOLIN HFA) 108 (90 BASE) MCG/ACT inhaler Inhale 2 puffs into the lungs every 6 (six) hours as needed for wheezing. 03/15/12  Yes Clanford Cyndie Mull, MD  aspirin 81 MG tablet Take 81 mg by mouth daily.   Yes Historical Provider, MD  diltiazem (CARDIZEM) 120 MG tablet Take 120 mg by mouth daily.   Yes Historical Provider, MD  lansoprazole (PREVACID) 15 MG capsule Take 15 mg by mouth daily.   Yes Historical Provider, MD  amoxicillin-clavulanate (AUGMENTIN) 875-125 MG per tablet Take 1 tablet by mouth 2 (two) times daily. Patient not taking: Reported on 04/30/2014 03/15/12   Clanford Cyndie Mull, MD  guaiFENesin-codeine [facility]) 100-10 MG/5ML syrup Take 5 mLs by mouth 3 (three) times daily as needed for cough (caution may cause drowsiness). Patient not taking: Reported on 04/30/2014 03/15/12   Clanford Cyndie Mull, MD  metaxalone (SKELAXIN) 800 MG tablet Take 1 tablet (800 mg total) by mouth 3 (three) times daily. Patient not taking: Reported on 04/30/2014 04/23/12   Carmelina Dane, MD  predniSONE (DELTASONE) 10 MG tablet Take 3 tabs po daily for 7 days Patient not taking: Reported on 04/30/2014 03/15/12   Clanford Cyndie Mull, MD  traMADol (ULTRAM) 50 MG tablet TAKE TWO TABLETS  BY MOUTH EVERY 6 HOURS AS NEEDED FOR PAIN Patient not taking: Reported on 04/30/2014 04/23/12   Carmelina DaneJeffery S Anderson, MD   BP 179/97 mmHg  Pulse 93  Temp(Src) 98.1 F (36.7 C) (Oral)  Resp 18  Ht 5\' 8"  (1.727 m)  Wt 172 lb (78.019 kg)  BMI 26.16 kg/m2  SpO2 98% Physical Exam  Constitutional: He is oriented to person, place, and time. He appears  well-developed and well-nourished.  HENT:  Head: Normocephalic and atraumatic.  Right Ear: External ear normal.  Left Ear: External ear normal.  Mouth/Throat: Oropharynx is clear and moist.  Eyes: Conjunctivae and EOM are normal. Pupils are equal, round, and reactive to light.  Neck: Normal range of motion. Neck supple.  Cardiovascular: Normal rate, regular rhythm, normal heart sounds and intact distal pulses.   Pulmonary/Chest: Effort normal and breath sounds normal. No respiratory distress. He has no wheezes. He has no rales. He exhibits no tenderness.  Abdominal: Soft. Bowel sounds are normal. He exhibits no distension and no mass. There is no tenderness. There is no rebound and no guarding.  Musculoskeletal: Normal range of motion.  Neurological: He is alert and oriented to person, place, and time.  Isolated R facial droop, sparing of forehead.  All other cranial nerves intact.  No focal motor or sensory deficit in BUE, or BLE.    Skin: Skin is warm and dry.  Nursing note and vitals reviewed.   ED Course  Procedures (including critical care time) Labs Review Labs Reviewed  COMPREHENSIVE METABOLIC PANEL - Abnormal; Notable for the following:    Glucose, Bld 104 (*)    GFR calc non Af Amer 73 (*)    GFR calc Af Amer 84 (*)    Anion gap 4 (*)    All other components within normal limits  CBC WITH DIFFERENTIAL/PLATELET  PROTIME-INR  TSH  LIPID PANEL  Rosezena SensorI-STAT TROPOININ, ED    Imaging Review Dg Chest 2 View  05/01/2014   CLINICAL DATA:  Stroke protocol.  EXAM: CHEST  2 VIEW  COMPARISON:  03/15/2012  FINDINGS: Mild emphysematous changes in the lungs. The heart size and mediastinal contours are within normal limits. Both lungs are clear. The visualized skeletal structures are unremarkable.  IMPRESSION: No active cardiopulmonary disease.   Electronically Signed   By: Burman NievesWilliam  Stevens M.D.   On: 05/01/2014 01:09   Mr Brain Wo Contrast  04/30/2014   CLINICAL DATA:  Right-sided facial  droop beginning yesterday with slurred speech.  EXAM: MRI HEAD WITHOUT CONTRAST  TECHNIQUE: Multiplanar, multiecho pulse sequences of the brain and surrounding structures were obtained without intravenous contrast.  COMPARISON:  None.  FINDINGS: There is a small, acute white matter infarct involving the centrum semiovale in the posterior left frontal lobe measuring approximately 2 x 0.8 cm. There is no evidence intracranial hemorrhage, mass, midline shift, or extra-axial fluid collection. Ventricles and sulci are within normal limits for age. A few small, scattered foci of T2 hyperintensity in the cerebral white matter bilaterally are nonspecific but compatible with mild chronic small vessel ischemic disease.  Orbits are unremarkable. Paranasal sinuses and mastoid air cells are clear. Major intracranial vascular flow voids are preserved.  IMPRESSION: Small, acute infarct in the left centrum semiovale.   Electronically Signed   By: Sebastian AcheAllen  Grady   On: 04/30/2014 20:13     EKG Interpretation None      MDM   Final diagnoses:  Facial droop  Acute CVA (cerebrovascular accident)   49 yo  M hx of cardiac arrhythmia on Cardizem, presents with CC of R facial droop.   Physical exam as above.  VS WNL.  Pt with R facial droop on exam with central sparing.  Concern for stroke.  Pt is 24 hours out from initial symptom onset, not a tPA candidate, does not meet criteria for Code Stroke.  MRi demonstrates small, acute infarct in the left centrum semiovale.  CBC, CMP, Troponin, and INR all WNL.    Neurology and Medicine consulted.  Pt to be admitted for further evaluation and management.  Pt understands and agrees with plan.   Jon Gills  Discussed pt with Dr. Ethelda Chick.     Jon Gills, MD 05/01/14 1610  Doug Sou, MD 05/01/14 [id number]

## 2014-04-30 NOTE — ED Notes (Signed)
Pt back from MRI 

## 2014-04-30 NOTE — ED Notes (Addendum)
Pt reports right side facial droop and tingling since last night. Pt denies any unilateral weakness. Speech is clear, grips are strong and equal. Pt take Cardizem for irregular heart rhythm and blood pressure. Pt denies any recent dental work

## 2014-04-30 NOTE — H&P (Signed)
[facility] History and Physical  Frank Silva WUG:[id number] DOB: Apr 06, 1965 DOA: 04/30/2014   PCP: He doesn't have a PCP Specialists: None  Chief Complaint: Slurred speech and facial droop  HPI: Frank Silva is a 49 y.o. male with a past medical history of hypertension, who does not take his medications on a regular basis and who was in his usual state of health yesterday around 5:00PM when he was having supper. He found himself biting his lips. He had some tingling in the right corner of his mouth. He went to work that night. He works as a Administrator. He felt that his speech was not proper. He also continued to have some drooping of his lips. He checked his face in the mirror and found that something was off. He denies any chest pain, shortness of breath. Denies any weakness in his arms or legs. He did have a headache all night last night. Denies any visual disturbances. Denies any choking episodes or difficulty swallowing. No new medications. No recent illness. He does not take his antihypertensive medications on a regular basis. He does not take aspirin on a regular basis. He mentions a history of palpitations in the past for which she has been seen by cardiology. He had a cardiac catheterization about 10 years ago which was unremarkable per the patient. There is also a questionable history of atrial fibrillation, though this is not confirmed.  Home Medications: Prior to Admission medications   Medication Sig Start Date End Date Taking? Authorizing Provider  albuterol (PROVENTIL HFA;VENTOLIN HFA) 108 (90 BASE) MCG/ACT inhaler Inhale 2 puffs into the lungs every 6 (six) hours as needed for wheezing. 03/15/12  Yes Clanford Marisa Hua, MD  aspirin 81 MG tablet Take 81 mg by mouth daily.   Yes Historical Provider, MD  diltiazem (CARDIZEM) 120 MG tablet Take 120 mg by mouth daily.   Yes Historical Provider, MD  lansoprazole (PREVACID) 15 MG capsule Take 15 mg by mouth daily.   Yes  Historical Provider, MD    Allergies:  Allergies  Allergen Reactions  . Vicodin [Hydrocodone-Acetaminophen] Rash  . Celebrex [Celecoxib]   . Peanut-Containing Drug Products Hives    Breathing     Past Medical History: Past Medical History  Diagnosis Date  . Allergy   . Neuromuscular disorder     Past Surgical History  Procedure Laterality Date  . Cardiac catheterization      Social History: He lives in a [place] and [place] with his wife. He is a Administrator. Smokes one and half packs of cigarettes on a daily basis. No alcohol use. No illicit drug use. Independent with daily activities.  Family History:  Family History  Problem Relation Age of Onset  . CVA Father      Review of Systems - History obtained from the patient General ROS: positive for  - fatigue Psychological ROS: negative Ophthalmic ROS: negative ENT ROS: negative Allergy and Immunology ROS: negative Hematological and Lymphatic ROS: negative Endocrine ROS: negative Respiratory ROS: no cough, shortness of breath, or wheezing Cardiovascular ROS: no chest pain or dyspnea on exertion Gastrointestinal ROS: no abdominal pain, change in bowel habits, or black or bloody stools Genito-Urinary ROS: no dysuria, trouble voiding, or hematuria Musculoskeletal ROS: negative Neurological ROS: as in hpi Dermatological ROS: negative  Physical Examination  Filed Vitals:   04/30/14 1831 04/30/14 1845 04/30/14 1900 04/30/14 2041  BP:  178/101 178/103 176/103  Pulse:  75 76 75  Temp: 98.1 F (36.7 C)  TempSrc:      Resp:  19 17 13   Height:      Weight:      SpO2:  96% 97% 97%    BP 176/103 mmHg  Pulse 75  Temp(Src) 98.1 F (36.7 C) (Oral)  Resp 13  Ht 5' 8"  (1.727 m)  Wt 78.019 kg (172 lb)  BMI 26.16 kg/m2  SpO2 97%  General appearance: alert, cooperative, appears stated age and no distress Head: Normocephalic, without obvious abnormality, atraumatic Eyes: conjunctivae/corneas clear. PERRL, EOM's  intact Throat: lips, mucosa, and tongue normal; teeth and gums normal Neck: no adenopathy, no carotid bruit, no JVD, supple, symmetrical, trachea midline and thyroid not enlarged, symmetric, no tenderness/mass/nodules Resp: clear to auscultation bilaterally Cardio: regular rate and rhythm, S1, S2 normal, no murmur, click, rub or gallop GI: soft, non-tender; bowel sounds normal; no masses,  no organomegaly Extremities: extremities normal, atraumatic, no cyanosis or edema Pulses: 2+ and symmetric Skin: Skin color, texture, turgor normal. No rashes or lesions Neurologic: Alert and oriented 3. Right-sided facial droop is noted. Strength is equal and normal bilateral upper and lower extremities.  Laboratory Data: Results for orders placed or performed during the hospital encounter of 04/30/14 (from the past 48 hour(s))  CBC with Differential     Status: None   Collection Time: 04/30/14  5:04 PM  Result Value Ref Range   WBC 8.6 4.0 - 10.5 K/uL   RBC 5.18 4.22 - 5.81 MIL/uL   Hemoglobin 17.0 13.0 - 17.0 g/dL   HCT 47.7 39.0 - 52.0 %   MCV 92.1 78.0 - 100.0 fL   MCH 32.8 26.0 - 34.0 pg   MCHC 35.6 30.0 - 36.0 g/dL   RDW 12.8 11.5 - 15.5 %   Platelets 207 150 - 400 K/uL   Neutrophils Relative % 54 43 - 77 %   Neutro Abs 4.7 1.7 - 7.7 K/uL   Lymphocytes Relative 36 12 - 46 %   Lymphs Abs 3.0 0.7 - 4.0 K/uL   Monocytes Relative 7 3 - 12 %   Monocytes Absolute 0.6 0.1 - 1.0 K/uL   Eosinophils Relative 2 0 - 5 %   Eosinophils Absolute 0.2 0.0 - 0.7 K/uL   Basophils Relative 1 0 - 1 %   Basophils Absolute 0.1 0.0 - 0.1 K/uL  Comprehensive metabolic panel     Status: Abnormal   Collection Time: 04/30/14  5:04 PM  Result Value Ref Range   Sodium 141 135 - 145 mmol/L   Potassium 3.5 3.5 - 5.1 mmol/L   Chloride 105 96 - 112 mmol/L   CO2 32 19 - 32 mmol/L   Glucose, Bld 104 (H) 70 - 99 mg/dL   BUN 12 6 - 23 mg/dL   Creatinine, Ser 1.15 0.50 - 1.35 mg/dL   Calcium 9.6 8.4 - 10.5 mg/dL    Total Protein 6.9 6.0 - 8.3 g/dL   Albumin 4.1 3.5 - 5.2 g/dL   AST 21 0 - 37 U/L   ALT 20 0 - 53 U/L   Alkaline Phosphatase 67 39 - 117 U/L   Total Bilirubin 0.4 0.3 - 1.2 mg/dL   GFR calc non Af Amer 73 (L) >90 mL/min   GFR calc Af Amer 84 (L) >90 mL/min    Comment: (NOTE) The eGFR has been calculated using the CKD EPI equation. This calculation has not been validated in all clinical situations. eGFR's persistently <90 mL/min signify possible Chronic Kidney Disease.    Anion gap  4 (L) 5 - 15  Protime-INR     Status: None   Collection Time: 04/30/14  5:04 PM  Result Value Ref Range   Prothrombin Time 13.0 11.6 - 15.2 seconds   INR 0.97 0.00 - 1.49  I-Stat Troponin, ED (not at [facility])     Status: None   Collection Time: 04/30/14  5:13 PM  Result Value Ref Range   Troponin i, poc 0.00 0.00 - 0.08 ng/mL   Comment 3            Comment: Due to the release kinetics of cTnI, a negative result within the first hours of the onset of symptoms does not rule out myocardial infarction with certainty. If myocardial infarction is still suspected, repeat the test at appropriate intervals.     Radiology Reports: Mr Herby Abraham Contrast  04/30/2014   CLINICAL DATA:  Right-sided facial droop beginning yesterday with slurred speech.  EXAM: MRI HEAD WITHOUT CONTRAST  TECHNIQUE: Multiplanar, multiecho pulse sequences of the brain and surrounding structures were obtained without intravenous contrast.  COMPARISON:  None.  FINDINGS: There is a small, acute white matter infarct involving the centrum semiovale in the posterior left frontal lobe measuring approximately 2 x 0.8 cm. There is no evidence intracranial hemorrhage, mass, midline shift, or extra-axial fluid collection. Ventricles and sulci are within normal limits for age. A few small, scattered foci of T2 hyperintensity in the cerebral white matter bilaterally are nonspecific but compatible with mild chronic small vessel ischemic disease.  Orbits are  unremarkable. Paranasal sinuses and mastoid air cells are clear. Major intracranial vascular flow voids are preserved.  IMPRESSION: Small, acute infarct in the left centrum semiovale.   Electronically Signed   By: Logan Bores   On: 04/30/2014 20:13    Electrocardiogram: Sinus rhythm at 98 bpm. , Left axis deviation. Nonspecific T wave changes. No concerning ST changes.  Problem List  Principal Problem:   Acute CVA (cerebrovascular accident) Active Problems:   Accelerated hypertension   History of Dysrhythmia, cardiac   Assessment: This is a 49 year old Caucasian male with a history of hypertension, which is not being treated appropriately. He has been noncompliant. He comes in with right facial droop, slurred speech. He has an acute stroke on the MRI.  Plan: #1 Acute stroke: Neurology to see. Stroke workup will be initiated. Continue aspirin for now. Allow permissive hypertension. Check lipid panel in the morning. PT and OT to see. Swallow screen.  #2 Questionable history of dysrhythmia: Previous records on EMR reviewed. No mention of atrial fibrillation. There might be benefit to obtaining records from Dr. Kennon Holter office in the morning. Patient does mention history of atrial fibrillation, but I am not able to confirm this. He is currently in sinus rhythm. Check TSH. Follow-up echocardiogram. Monitor on telemetry.  #3 Accelerated hypertension: Due to noncompliance. We will allow permissive hypertension for now. Hydralazine only for significantly elevated blood pressures due to the acute stroke.  DVT Prophylaxis: Lovenox Code Status: Full code Family Communication: Discussed with the patient  Disposition Plan: Admit to telemetry   Further management decisions will depend on results of further testing and patient's response to treatment.   [facility]  [facility] Pager [phone]  If 7PM-7AM, please contact night-coverage [email or web address] Password [facility]  04/30/2014, 9:16  PM

## 2014-04-30 NOTE — Consult Note (Signed)
Neurology Consultation Reason for Consult: Stroke Referring Physician: Ethelda ChickJacubowitz, S  CC: Facial weakness  History is obtained from: Patient  HPI: Frank Silva is a 49 y.o. male who was eating dinner yesterday evening when he began biting his lip repeatedly. Over the course of the night he noticed that his face felt weaker and weaker and was still going on today and therefore he sought evaluation in the emergency department. He denies any symptoms outside of his face. He states that it is really just motor.   He states that he had a single episode of atrial fibrillation in the setting of a cardiac catheterization. Per the patient, this happen only a single time when he was 38 and has not recurred.  LKW: 2/20 tpa given?: no, outside of window    ROS: A 14 point ROS was performed and is negative except as noted in the HPI.   Past Medical History  Diagnosis Date  . Allergy   . Neuromuscular disorder     Family History: Multiple family members with stroke  Social History: Tob: Current smoker. Patient is counseled to stop smoking.  Exam: Current vital signs: BP 153/88 mmHg  Pulse 69  Temp(Src) 98.1 F (36.7 C) (Oral)  Resp 12  Ht 5\' 8"  (1.727 m)  Wt 78.019 kg (172 lb)  BMI 26.16 kg/m2  SpO2 98% Vital signs in last 24 hours: Temp:  [98.1 F (36.7 C)] 98.1 F (36.7 C) (02/21 1831) Pulse Rate:  [69-93] 69 (02/21 2115) Resp:  [12-19] 12 (02/21 2115) BP: (153-179)/(88-103) 153/88 mmHg (02/21 2115) SpO2:  [96 %-98 %] 98 % (02/21 2115) Weight:  [78.019 kg (172 lb)] 78.019 kg (172 lb) (02/21 1703)   Physical Exam  Constitutional: Appears well-developed and well-nourished.  Psych: Affect appropriate to situation Eyes: No scleral injection HENT: No OP obstrucion Head: Normocephalic.  Cardiovascular: Normal rate and regular rhythm.  Respiratory: Effort normal and breath sounds normal to anterior ascultation GI: Soft.  No distension. There is no tenderness.  Skin:  WDI  Neuro: Mental Status: Patient is awake, alert, oriented to person, place, month, year, and situation. Patient is able to give a clear and coherent history. No signs of aphasia or neglect Cranial Nerves: II: Visual Fields are full. Pupils are equal, round, and reactive to light.   III,IV, VI: EOMI without ptosis or diploplia.  V: Facial sensation is symmetric to temperature VII: Facial movement is notable for right facial weakness VIII: hearing is intact to voice X: Uvula elevates symmetrically XI: Shoulder shrug is symmetric. XII: tongue is midline without atrophy or fasciculations.  Motor: Tone is normal. Bulk is normal. 5/5 strength was present in all four extremities.  Sensory: Sensation is symmetric to light touch and temperature in the arms and legs. Deep Tendon Reflexes: 2+ and symmetric in the biceps and patellae.  Plantars: Toes are downgoing bilaterally.  Cerebellar: FNF and HKS are intact bilaterally    I have reviewed labs in epic and the results pertinent to this consultation are: CMP-unremarkable  I have reviewed the images obtained: MRI brain-subcortical white matter infarction.  Impression: 49 year old male facial weakness due to infarct. He is being admitted for further evaluation of the etiology of the stroke and risk factor modification. He was not taking regular aspirin prior to admission.  Recommendations: 1. HgbA1c, fasting lipid panel 2. MRA  of the brain without contrast 3. Frequent neuro checks 4. Echocardiogram 5. Carotid dopplers 6. Prophylactic therapy-Antiplatelet med: Aspirin - dose 325mg  PO or 300mg   PR 7. Risk factor modification 8. Telemetry monitoring 9. PT consult, OT consult, Speech consult    Ritta Slot, MD [facility] [phone]  If 7pm- 7am, please page neurology on call as listed in AMION.

## 2014-04-30 NOTE — ED Provider Notes (Signed)
Complains of right-sided mouth droop onset 4:30 PM yesterday with slight slurred speech. No other complaint. On exam alert Glasgow Coma Score 15. Right-sided mouth droop lungs clear auscultation heart regular rate and rhythm neurologic Glasgow Coma Score 15, right-sided central cranial nerve VII deficit other cranial nerves II through XII grossly intact. Motor strength 5 over 5 overall pronator drift normal finger to nose normal heel to shin normal DTR symmetric bilaterally at knee jerk ankle jerk and biceps toes downward going bilaterally  Doug SouSam Bunnie Lederman, MD 04/30/14 Rickey [id number]

## 2014-04-30 NOTE — Progress Notes (Signed)
Received report from E.D nurse.  Awaiting patients arrival on unit.

## 2014-04-30 NOTE — ED Notes (Signed)
Dr. Kirkpatrick at bedside 

## 2014-04-30 NOTE — ED Notes (Signed)
MD at bedside. 

## 2014-04-30 NOTE — Progress Notes (Signed)
Patient admitted to room 4N06   Welcomed, Oriented to room and unit  Ambulated to bathroom gait steady.

## 2014-05-01 DIAGNOSIS — I639 Cerebral infarction, unspecified: Secondary | ICD-10-CM | POA: Insufficient documentation

## 2014-05-01 DIAGNOSIS — I480 Paroxysmal atrial fibrillation: Secondary | ICD-10-CM

## 2014-05-01 DIAGNOSIS — I6789 Other cerebrovascular disease: Secondary | ICD-10-CM

## 2014-05-01 DIAGNOSIS — R29810 Facial weakness: Secondary | ICD-10-CM

## 2014-05-01 LAB — LIPID PANEL
Cholesterol: 154 mg/dL (ref 0–200)
HDL: 37 mg/dL — ABNORMAL LOW (ref >39)
LDL Cholesterol: 90 mg/dL (ref 0–99)
Total CHOL/HDL Ratio: 4.2 RATIO
Triglycerides: 137 mg/dL (ref <150)
VLDL: 27 mg/dL (ref 0–40)

## 2014-05-01 LAB — TSH: TSH: 1.303 u[IU]/mL (ref 0.350–4.500)

## 2014-05-01 MED ORDER — RIVAROXABAN 20 MG PO TABS
20.0000 mg | ORAL_TABLET | Freq: Every day | ORAL | Status: DC
  Administered 2014-05-01: 20 mg via ORAL
  Filled 2014-05-01: qty 1

## 2014-05-01 MED ORDER — RIVAROXABAN 20 MG PO TABS: 20.0000 mg | ORAL_TABLET | Freq: Every day | ORAL | Status: DC

## 2014-05-01 MED ORDER — DILTIAZEM HCL 90 MG PO TABS
120.0000 mg | ORAL_TABLET | Freq: Every day | ORAL | Status: DC
  Administered 2014-05-01: 120 mg via ORAL
  Filled 2014-05-01 (×2): qty 1

## 2014-05-01 MED ORDER — DILTIAZEM HCL 120 MG PO TABS: 120.0000 mg | ORAL_TABLET | Freq: Every day | ORAL | Status: DC

## 2014-05-01 NOTE — Progress Notes (Signed)
CARE MANAGEMENT NOTE 05/01/2014  Patient:  Frank Silva,Frank Silva   Account Number:  [id number]  Date Initiated:  05/01/2014  Documentation initiated by:  Jiles CrockerHANDLER,Erby Sanderson  Subjective/Objective Assessment:   ADMITTED WITH CVA     Action/Plan:   CM FOLLOWING FOR DCP   Anticipated DC Date:  05/03/2014   Anticipated DC Plan:  HOME W HOME HEALTH SERVICES     DC Planning Services  CM consult           Status of service:  In process, will continue to follow Medicare Important Message given?   (If response is "NO", the following Medicare IM given date fields will be blank)   Per UR Regulation:  Reviewed for med. necessity/level of care/duration of stay  Comments:  2/22/2016Abelino Derrick- B Benjy Kana RN,BSN,MHA [phone]

## 2014-05-01 NOTE — Discharge Instructions (Signed)

## 2014-05-01 NOTE — Evaluation (Signed)
Speech Language Pathology Evaluation Patient Details Name: Frank Silva MRN: [id number] DOB: 03-31-1965 Today's Date: 05/01/2014 Time: 4098-1191 SLP Time Calculation (min) (ACUTE ONLY): 23 min  Problem List:  Patient Active Problem List   Diagnosis Date Noted  . Acute CVA (cerebrovascular accident) 04/30/2014  . Accelerated hypertension 04/30/2014  . History of Dysrhythmia, cardiac 04/30/2014   Past Medical History:  Past Medical History  Diagnosis Date  . Allergy   . Neuromuscular disorder    Past Surgical History:  Past Surgical History  Procedure Laterality Date  . Cardiac catheterization     HPI:  Pt is a 49 y/o male with a past medical history of hypertension, who does not take his medications on a regular basis and who was in his usual state of health yesterday around 5:00PM when he was having supper. He found himself biting his lips. He had some tingling in the right corner of his mouth. He went to work that night. He works as a Naval architect. He felt that his speech was not proper. He also continued to have some drooping of his lips. He checked his face in the mirror and found that something was off. He denies any chest pain, shortness of breath. Denies any weakness in his arms or legs. He did have a headache all night last night. Denies any visual disturbances. Denies any choking episodes or difficulty swallowing. No new medications. No recent illness. He does not take his antihypertensive medications on a regular basis. He does not take aspirin on a regular basis. He mentions a history of palpitations in the past for which she has been seen by cardiology. He had a cardiac catheterization about 10 years ago which was unremarkable per the patient. There is also a questionable history of atrial fibrillation, though this is not confirmed. MR revealed small acute infarct in the left sentrum semiovale.    Assessment / Plan / Recommendation Clinical Impression   Goal of session was  to assess pt's speech, language and cognitive skills. Noted pt has right sided weakness of the lip. Pt demonstrates clear vocal quality and articulation with appropriate volume. Pt demonstrates adequate expressive and receptive language skills including naming, repetition, reading and writing skills. Speech will sign off.     SLP Assessment       Follow Up Recommendations       Frequency and Duration        Pertinent Vitals/Pain Pain Assessment: 0-10 Pain Score: 3    SLP Goals     SLP Evaluation Prior Functioning  Cognitive/Linguistic Baseline: Within functional limits Vocation: Full time employment   Cognition  Overall Cognitive Status: Within Functional Limits for tasks assessed Arousal/Alertness: Awake/alert Orientation Level: Oriented X4 Attention: Divided;Alternating Alternating Attention: Appears intact Divided Attention: Appears intact Memory: Appears intact Awareness: Appears intact Executive Function: Reasoning Reasoning: Appears intact Safety/Judgment: Appears intact    Comprehension  Auditory Comprehension Overall Auditory Comprehension: Appears within functional limits for tasks assessed Yes/No Questions: Within Functional Limits Commands: Within Functional Limits Conversation: Complex Visual Recognition/Discrimination Discrimination: Within Function Limits Reading Comprehension Reading Status: Within funtional limits    Expression Expression Primary Mode of Expression: Verbal Verbal Expression Overall Verbal Expression: Appears within functional limits for tasks assessed Initiation: No impairment Automatic Speech: Month of year;Counting Level of Generative/Spontaneous Verbalization: Sentence Repetition: No impairment Naming: No impairment Pragmatics: No impairment Written Expression Dominant Hand: Right Written Expression: Within Functional Limits   Oral / Motor Oral Motor/Sensory Function Overall Oral Motor/Sensory Function: Impaired Labial  ROM: Reduced right Labial Symmetry: Abnormal symmetry right Lingual ROM: Within Functional Limits Lingual Symmetry: Within Functional Limits Facial ROM: Within Functional Limits Motor Speech Overall Motor Speech: Appears within functional limits for tasks assessed   GO     Bermudez-Bosch, Adriana Lina 05/01/2014, 10:03 AM

## 2014-05-01 NOTE — Evaluation (Signed)
Physical Therapy Evaluation/Discharge Patient Details Name: Frank DimitriSamuel R Inscoe MRN: [id number] DOB: 1965-12-13 Today's Date: 05/01/2014   History of Present Illness  49 [facility]. [facility] for right sided facial droop and slurred speech.  Pt (+) MRI for Small, acute infarct in the left centrum semiovale.  Stroke workup in progress.  Pt with significant PMHx of A-fib (per pt report), and HTN.    Clinical Impression  Pt is independent in all mobility.  Strength, sensation, and coordination are WNL in lower extremities.  His only remaining deficits seems to be his right facial droop.  Balance WNL.  Pt has no further acute PT needs or f/u PT needs at discharge.  PT to sign off.     Follow Up Recommendations No PT follow up    Equipment Recommendations  None recommended by PT    Recommendations for Other Services   NA    Precautions / Restrictions Precautions Precautions: None      Mobility  Bed Mobility Overal bed mobility: Independent                Transfers Overall transfer level: Independent                  Ambulation/Gait Ambulation/Gait assistance: Independent Ambulation Distance (Feet): 500 Feet Assistive device: None Gait Pattern/deviations: WFL(Within Functional Limits)   Gait velocity interpretation: at or above normal speed for age/gender    Stairs Stairs: Yes Stairs assistance: Independent Stair Management: No rails Number of Stairs: 5     Modified Rankin (Stroke Patients Only) Modified Rankin (Stroke Patients Only) Pre-Morbid Rankin Score: No symptoms Modified Rankin: No significant disability           Pertinent Vitals/Pain Pain Assessment: 0-10 Pain Score: 3  Pain Location: head Pain Descriptors / Indicators: Aching Pain Intervention(s): Limited activity within patient's tolerance;Monitored during session;Repositioned    Home Living Family/patient expects to be discharged to:: Private residence Living  Arrangements: Spouse/significant other Available Help at Discharge: Family;Available 24 hours/day (supervision, some assist) Type of Home: House Home Access: Level entry     Home Layout: Two level;Other (Comment) (only bathroom is upstairs bedroom is downstairs) Home Equipment: None      Prior Function Level of Independence: Independent         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 )      Cervical / Trunk Assessment: Normal  Communication   Communication: No difficulties;Other (comment) (despite right facial droop)  Cognition Arousal/Alertness: Awake/alert Behavior During Therapy: WFL for tasks assessed/performed Overall Cognitive Status: Within Functional Limits for tasks assessed                      General Comments General comments (skin integrity, edema, etc.): Reviewed signs and symptoms of a stroke with pt and why it is important not to wait to come to the hospital.           Assessment/Plan    PT Assessment Patent does not need any further PT services  PT Diagnosis Generalized weakness;Acute pain         PT Goals (Current goals can be found in the Care Plan section) Acute  Rehab PT Goals PT Goal Formulation: All assessment and education complete, DC therapy               End of Session   Activity Tolerance: Patient tolerated treatment well Patient left: in bed;with call bell/phone within reach Nurse Communication: Mobility status (can be up in room independently. )         Time: 1010-1030 PT Time Calculation (min) (ACUTE ONLY): 20 min   Charges:   PT Evaluation $Initial PT Evaluation Tier I: 1 Procedure          Angela Vazguez B. Beyonce Sawatzky, PT, DPT  [phone]   05/01/2014, 10:40 AM

## 2014-05-01 NOTE — Progress Notes (Signed)
ANTICOAGULATION CONSULT NOTE - Initial Consult  Pharmacy Consult for rivaroxaban Indication: atrial fibrillation  Allergies  Allergen Reactions  . Vicodin [Hydrocodone-Acetaminophen] Rash  . Celebrex [Celecoxib]   . Peanut-Containing Drug Products Hives    Breathing     Patient Measurements: Height: 5\' 8"  (172.7 cm) Weight: 172 lb (78.019 kg) IBW/kg (Calculated) : 68.4 Heparin Dosing Weight:   Vital Signs: Temp: 98.1 F (36.7 C) (02/22 1010) Temp Source: Oral (02/22 1010) BP: 189/97 mmHg (02/22 1010) Pulse Rate: 71 (02/22 1010)  Labs:  Recent Labs  04/30/14 1704  HGB 17.0  HCT 47.7  PLT 207  LABPROT 13.0  INR 0.97  CREATININE 1.15    Estimated Creatinine Clearance: 74.3 mL/min (by C-G formula based on Cr of 1.15).   Medical History: Past Medical History  Diagnosis Date  . Allergy   . Neuromuscular disorder     Medications:  See EMR  Assessment: 49 yo male admitted with acute ischemic stroke, per patient, has a remote history of AFib; was not on anticoagulation PTA.  Initiating Xarelto - h/h 17/47, plts 207. Pt is at increased bleeding risk while he is on aspirin 325 daily.  Goal of Therapy:  Monitor platelets by anticoagulation protocol: Yes   Plan:  Rivaroxaban 20 mg po daily  CBC q72h   Agapito GamesAlison Kirandeep Fariss, PharmD, BCPS Clinical Pharmacist Pager: [phone] 05/01/2014 11:14 AM

## 2014-05-01 NOTE — Progress Notes (Signed)
STROKE TEAM PROGRESS NOTE   HISTORY Frank Silva is a 49 y.o. male who was eating dinner yesterday evening when he began biting his lip repeatedly. Over the course of the night he noticed that his face felt weaker and weaker and was still going on today and therefore he sought evaluation in the emergency department. He denies any symptoms outside of his face. He states that it is really just motor.   He states that he had a single episode of atrial fibrillation in the setting of a cardiac catheterization. Per the patient, this happen only a single time when he was 38 and has not recurred.  LKW: 2/20 tpa given?: no, outside of window   He was admitted to the neuro 4 N for further evaluation and treatment.   SUBJECTIVE (INTERVAL HISTORY) Overall he feels his condition is unchanged. He has history of atrial fibrillation in the past but was not on anticoagulation. He appears to be extremely anxious to use try new medications because of fear of side effects.  OBJECTIVE Temp:  [98 F (36.7 C)-98.4 F (36.9 C)] 98.4 F (36.9 C) (02/22 0844) Pulse Rate:  [69-93] 71 (02/22 0844) Cardiac Rhythm:  [-] Normal sinus rhythm (02/22 0500) Resp:  [12-19] 16 (02/22 0844) BP: (153-179)/(82-103) 165/94 mmHg (02/22 0844) SpO2:  [96 %-98 %] 98 % (02/22 0844) Weight:  [78.019 kg (172 lb)] 78.019 kg (172 lb) (02/21 1703)  No results for input(s): GLUCAP in the last 168 hours.  Recent Labs Lab 04/30/14 1704  NA 141  K 3.5  CL 105  CO2 32  GLUCOSE 104*  BUN 12  CREATININE 1.15  CALCIUM 9.6    Recent Labs Lab 04/30/14 1704  AST 21  ALT 20  ALKPHOS 67  BILITOT 0.4  PROT 6.9  ALBUMIN 4.1    Recent Labs Lab 04/30/14 1704  WBC 8.6  NEUTROABS 4.7  HGB 17.0  HCT 47.7  MCV 92.1  PLT 207   No results for input(s): CKTOTAL, CKMB, CKMBINDEX, TROPONINI in the last 168 hours.  Recent Labs  04/30/14 1704  LABPROT 13.0  INR 0.97   No results for input(s): COLORURINE, LABSPEC,  PHURINE, GLUCOSEU, HGBUR, BILIRUBINUR, KETONESUR, PROTEINUR, UROBILINOGEN, NITRITE, LEUKOCYTESUR in the last 72 hours.  Invalid input(s): APPERANCEUR     Component Value Date/Time   CHOL 154 05/01/2014 0358   TRIG 137 05/01/2014 0358   HDL 37* 05/01/2014 0358   CHOLHDL 4.2 05/01/2014 0358   VLDL 27 05/01/2014 0358   LDLCALC 90 05/01/2014 0358   No results found for: HGBA1C No results found for: LABOPIA, COCAINSCRNUR, LABBENZ, AMPHETMU, THCU, LABBARB  No results for input(s): ETH in the last 168 hours.  Dg Chest 2 View  05/01/2014   CLINICAL DATA:  Stroke protocol.  EXAM: CHEST  2 VIEW  COMPARISON:  03/15/2012  FINDINGS: Mild emphysematous changes in the lungs. The heart size and mediastinal contours are within normal limits. Both lungs are clear. The visualized skeletal structures are unremarkable.  IMPRESSION: No active cardiopulmonary disease.   Electronically Signed   By: Burman NievesWilliam  Stevens M.D.   On: 05/01/2014 01:09   Mr Brain Wo Contrast  04/30/2014   CLINICAL DATA:  Right-sided facial droop beginning yesterday with slurred speech.  EXAM: MRI HEAD WITHOUT CONTRAST  TECHNIQUE: Multiplanar, multiecho pulse sequences of the brain and surrounding structures were obtained without intravenous contrast.  COMPARISON:  None.  FINDINGS: There is a small, acute white matter infarct involving the centrum semiovale in the  posterior left frontal lobe measuring approximately 2 x 0.8 cm. There is no evidence intracranial hemorrhage, mass, midline shift, or extra-axial fluid collection. Ventricles and sulci are within normal limits for age. A few small, scattered foci of T2 hyperintensity in the cerebral white matter bilaterally are nonspecific but compatible with mild chronic small vessel ischemic disease.  Orbits are unremarkable. Paranasal sinuses and mastoid air cells are clear. Major intracranial vascular flow voids are preserved.  IMPRESSION: Small, acute infarct in the left centrum semiovale.    Electronically Signed   By: Sebastian Ache   On: 04/30/2014 20:13   Mr Maxine Glenn Head/brain Wo Cm  05/01/2014   CLINICAL DATA:  Initial evaluation for acute stroke.  EXAM: MRA HEAD WITHOUT CONTRAST  TECHNIQUE: Angiographic images of the Circle of Willis were obtained using MRA technique without intravenous contrast.  COMPARISON:  Prior MRI from 04/30/2014.  FINDINGS: ANTERIOR CIRCULATION:  Visualized portions of the distal cervical segments of the internal carotid arteries are widely patent with antegrade flow. The petrous, cavernous, and supra clinoid segments are widely patent. A1 segments, anterior communicating artery, and anterior cerebral arteries are widely patent. Median callosal branch noted.  M1 segments widely patent without occlusion or significant stenosis. MCA bifurcations normal. Distal MCA branches symmetric and normal bilaterally.  POSTERIOR CIRCULATION:  Right vertebral artery is slightly dominant. The vertebral arteries are well opacified to the level of the vertebrobasilar junction. The left vertebral artery divides at the takeoff of the left posterior inferior cerebral artery with a smaller hypoplastic branch descending to the vertebrobasilar junction. The posterior inferior cerebellar arteries and cells are widely patent. The basilar artery is widely patent. Anterior inferior cerebral arteries and superior cerebral arteries are patent bilaterally. P1 and P2 segments well opacified without significant stenosis or occlusion. Small posterior communicating arteries present bilaterally, larger on the right.  No aneurysm or vascular malformation.  IMPRESSION: Normal MRA of the brain. No proximal branch occlusion or hemodynamically significant stenosis identified.   Electronically Signed   By: Rise Mu M.D.   On: 05/01/2014 06:18     PHYSICAL EXAM Constitutional: Appears well-developed and well-nourished.  Psych: Affect appropriate to situation Eyes: No scleral injection HENT: No OP  obstrucion Head: Normocephalic.  Cardiovascular: Normal rate and regular rhythm.  Respiratory: Effort normal and breath sounds normal to anterior ascultation GI: Soft. No distension. There is no tenderness.  Skin: WDI  Neuro: Mental Status: Patient is awake, alert, oriented to person, place, month, year, and situation. Patient is able to give a clear and coherent history. No signs of aphasia or neglect Cranial Nerves: II: Visual Fields are full. Pupils are equal, round, and reactive to light.  III,IV, VI: EOMI without ptosis or diploplia.  V: Facial sensation is symmetric to temperature VII: Facial movement is notable for right facial droop VIII: hearing is intact to voice X: Uvula elevates symmetrically XI: Shoulder shrug is symmetric. XII: tongue is midline without atrophy or fasciculations.  Motor: Tone is normal. Bulk is normal. 5/5 strength was present in all four extremities. Diminished fine finger movements on the right. Orbits left over right upper extremity. Mild weakness of right grip. Sensory: Sensation is symmetric to light touch and temperature in the arms and legs. Deep Tendon Reflexes: 2+ and symmetric in the biceps and patellae.  Plantars: Toes are downgoing bilaterally.  Cerebellar: FNF and HKS are intact bilaterally  ASSESSMENT/PLAN Mr. DEACON GADBOIS is a 49 y.o. male with history of neuromuscular disorder presenting with right facial weakness.  He did not receive IV t-PA due to being  outside of window.  Stroke small, acute infarct in the left centrum infarct ? Embolic due to Afib with a past history of Afib in the past.  Resultant  Mild right facial droop  MRI  Small, acute infarct in the left centrum semiovale.   MRA Neg  Carotid Doppler Pending  2D Echo  pending  LDL 90  HgbA1c pending  Lovenox for VTE prophylaxis  Diet Heart thin liquids  no antithrombotic prior to admission, now on aspirin 325 mg orally every day. Recommend  starting Xarelto  Patient counseled to be compliant with his antithrombotic medications  Ongoing aggressive stroke risk factor management  Therapy recommendations: no therapy required  Disposition:  Home  Hypertension  Home meds:   Nil of note  Unstable  Patient counseled to be compliant with his blood pressure medications  Hyperlipidemia  Home meds:  Nil of note  LDL 90, goal < 70  Add lipitor  Continue statin at discharge  Diabetes  HgbA1c pending, goal < 7.0  Other Stroke Risk Factors  Cigarette smoker advised to stop smoking  Obesity, Body mass index is 26.16 kg/(m^2).   Coronary artery disease  Other Pertinent History  History is Afib in the past  Medication no compliance  Patients chart and images have been reviewed. Patient seen and discussed with Dr. Pearlean Brownie No other neurology recommendations at this time.  We will sign off.  Hospital day # 1  Rudean Hitt, PA-C Dept. of Neurology   I have personally examined this patient, reviewed notes, independently viewed imaging studies, participated in medical decision making and plan of care. I have made any additions or clarifications directly to the above note. Agree with note above. He has had an embolic left frontal subcortical infarct with known history of atrial fibrillation and meets criteria for long-term anticoagulation. Patient appears to be extremely reluctant but I had a long 15 minute discussion with him to convince and to take Xarelto. He's had recurrent risk for strokes and TIAs and needs to continue ongoing stroke evaluation and management and aggressive risk factor modification.  Delia Heady, MD Medical Director [facility] Pager: [phone] 05/01/2014 2:05 PM  To contact Stroke Continuity provider, please refer to [email or web address]. After hours, contact General Neurology

## 2014-05-01 NOTE — Progress Notes (Signed)
D/C orders received. Pt educated on d/c instructions and stroke education. Pt handed d/c packet. IV and tele removed. Pt refused wheelchair and walked with staff downstairs for d/c.

## 2014-05-01 NOTE — Progress Notes (Signed)
  Echocardiogram 2D Echocardiogram has been performed.  Delcie RochENNINGTON, Arlin Savona 05/01/2014, 2:04 PM

## 2014-05-01 NOTE — Progress Notes (Signed)
VASCULAR LAB PRELIMINARY  PRELIMINARY  PRELIMINARY  PRELIMINARY  Carotid Dopplers completed.    Preliminary report:  1-39% ICA stenosis.  Vertebral artery flow is antegrade.   Davelyn Gwinn, RVT 05/01/2014, 2:37 PM

## 2014-05-01 NOTE — Discharge Summary (Signed)
Physician Discharge Summary  PRATYUSH AMMON WUX:[id number] DOB: April 09, 1965 DOA: 04/30/2014  PCP: No PCP Per Patient  Admit date: 04/30/2014 Discharge date: 05/01/2014  Time spent: 45 minutes  Recommendations for Outpatient Follow-up:  1. Cont Xarelto 2. He is aware that he needs to find a PCP  Discharge Condition: stable Diet recommendation: heart healthy, low sodium  Discharge Diagnoses:  Principal Problem:   Acute CVA (cerebrovascular accident) Active Problems:   Accelerated hypertension   History of A-fib   Facial droop      History of present illness:  Frank Silva is a 49 y.o. male with a past medical history of hypertension, who does not take his medications on a regular basis and who was in his usual state of health yesterday around 5:00PM when he was having supper. He found himself biting his lips. He had some tingling in the right corner of his mouth. He went to work that night. He works as a Naval architect. He felt that his speech was not proper. He also continued to have some drooping of his lips. He checked his face in the mirror and found that something was off. He denies any chest pain, shortness of breath. Denies any weakness in his arms or legs. He did have a headache all night last night. Denies any visual disturbances. Denies any choking episodes or difficulty swallowing. No new medications. No recent illness. He does not take his antihypertensive medications on a regular basis. He does not take aspirin on a regular basis. He mentions a history of palpitations in the past for which she has been seen by cardiology. He had a cardiac catheterization about 10 years ago which was unremarkable per the patient. There is also a questionable history of atrial fibrillation, though this is not confirmed.  Hospital Course:  CVA-acute infarct in the left centrum semiovale - MRA normal- report below - most likely embolic- he has a remote h/o of A-fib (one episode) and was supposed  to be taking a baby aspirin and Cardizem but was not not doing so - neuro has evaluated him and they agree that most likely the CVA was embolic - I spoke with him about starting a NOAC but he was very hesitant to do this, stating that Aspirin would be sufficient- Dr Pearlean Brownie (neuro) then spoke with him for an extensive amount of time and has convinced him of the necessity for Xarelto- the patient is in agreement with taking it and I have given him a prescription  HTN - resuming Cardizem for presumed A-fib which will work for HTN as well  Nicotine smoker - is advised to quit and tells me he will try to do so- does not want prescriptions for Nicotine patches   Procedures: ECHO  - Left ventricle: The cavity size was normal. Wall thickness was normal. Systolic function was normal. The estimated ejection fraction was in the range of 55% to 60%. Wall motion was normal; there were no regional wall motion abnormalities. Doppler parameters are consistent with abnormal left ventricular relaxation (grade 1 diastolic dysfunction).  Impressions:  - Normal LV function; grade 1 diastolic dysfunction.  Carotid duplex- prelim report--1-39% ICA stenosis. Vertebral artery flow is antegrade.   Consultations:  Dr Pearlean Brownie- stroke team  Discharge Exam: [facility]   04/30/14 1703  Weight: 78.019 kg (172 lb)   Filed Vitals:   05/01/14 1602  BP: 172/84  Pulse: 72  Temp: 98.6 F (37 C)  Resp: 18    General: AAO x  3, no distress-  Cardiovascular: RRR, no murmurs  Respiratory: clear to auscultation bilaterally GI: soft, non-tender, non-distended, bowel sound positive Neuro:right facial droop - no other focal deficits  Discharge Instructions You were cared for by a hospitalist during your hospital stay. If you have any questions about your discharge medications or the care you received while you were in the hospital after you are discharged, you can call the unit and asked to speak  with the hospitalist on call if the hospitalist that took care of you is not available. Once you are discharged, your primary care physician will handle any further medical issues. Please note that NO REFILLS for any discharge medications will be authorized once you are discharged, as it is imperative that you return to your primary care physician (or establish a relationship with a primary care physician if you do not have one) for your aftercare needs so that they can reassess your need for medications and monitor your lab values.      Discharge Instructions    Diet - low sodium heart healthy    Complete by:  As directed      Increase activity slowly    Complete by:  As directed             Medication List    STOP taking these medications        aspirin 81 MG tablet      TAKE these medications        albuterol 108 (90 BASE) MCG/ACT inhaler  Commonly known as:  PROVENTIL HFA;VENTOLIN HFA  Inhale 2 puffs into the lungs every 6 (six) hours as needed for wheezing.     diltiazem 120 MG tablet  Commonly known as:  CARDIZEM  Take 1 tablet (120 mg total) by mouth daily.     lansoprazole 15 MG capsule  Commonly known as:  PREVACID  Take 15 mg by mouth daily.     rivaroxaban 20 MG Tabs tablet  Commonly known as:  XARELTO  Take 1 tablet (20 mg total) by mouth daily.       Allergies  Allergen Reactions  . Vicodin [Hydrocodone-Acetaminophen] Rash  . Celebrex [Celecoxib]   . Peanut-Containing Drug Products Hives    Breathing    Follow-up Information    Follow up with SETHI,PRAMOD, MD. Schedule an appointment as soon as possible for a visit in 1 month.   Specialties:  Neurology, Radiology   Contact information:   [address] [place] [id number] [phone]        The results of significant diagnostics from this hospitalization (including imaging, microbiology, ancillary and laboratory) are listed below for reference.    Significant Diagnostic  Studies: Dg Chest 2 View  05/01/2014   CLINICAL DATA:  Stroke protocol.  EXAM: CHEST  2 VIEW  COMPARISON:  03/15/2012  FINDINGS: Mild emphysematous changes in the lungs. The heart size and mediastinal contours are within normal limits. Both lungs are clear. The visualized skeletal structures are unremarkable.  IMPRESSION: No active cardiopulmonary disease.   Electronically Signed   By: Burman NievesWilliam  Stevens M.D.   On: 05/01/2014 01:09   Mr Brain Wo Contrast  04/30/2014   CLINICAL DATA:  Right-sided facial droop beginning yesterday with slurred speech.  EXAM: MRI HEAD WITHOUT CONTRAST  TECHNIQUE: Multiplanar, multiecho pulse sequences of the brain and surrounding structures were obtained without intravenous contrast.  COMPARISON:  None.  FINDINGS: There is a small, acute white matter infarct involving the centrum semiovale in  the posterior left frontal lobe measuring approximately 2 x 0.8 cm. There is no evidence intracranial hemorrhage, mass, midline shift, or extra-axial fluid collection. Ventricles and sulci are within normal limits for age. A few small, scattered foci of T2 hyperintensity in the cerebral white matter bilaterally are nonspecific but compatible with mild chronic small vessel ischemic disease.  Orbits are unremarkable. Paranasal sinuses and mastoid air cells are clear. Major intracranial vascular flow voids are preserved.  IMPRESSION: Small, acute infarct in the left centrum semiovale.   Electronically Signed   By: Sebastian Ache   On: 04/30/2014 20:13   Mr Maxine Glenn Head/brain Wo Cm  05/01/2014   CLINICAL DATA:  Initial evaluation for acute stroke.  EXAM: MRA HEAD WITHOUT CONTRAST  TECHNIQUE: Angiographic images of the Circle of Willis were obtained using MRA technique without intravenous contrast.  COMPARISON:  Prior MRI from 04/30/2014.  FINDINGS: ANTERIOR CIRCULATION:  Visualized portions of the distal cervical segments of the internal carotid arteries are widely patent with antegrade flow. The  petrous, cavernous, and supra clinoid segments are widely patent. A1 segments, anterior communicating artery, and anterior cerebral arteries are widely patent. Median callosal branch noted.  M1 segments widely patent without occlusion or significant stenosis. MCA bifurcations normal. Distal MCA branches symmetric and normal bilaterally.  POSTERIOR CIRCULATION:  Right vertebral artery is slightly dominant. The vertebral arteries are well opacified to the level of the vertebrobasilar junction. The left vertebral artery divides at the takeoff of the left posterior inferior cerebral artery with a smaller hypoplastic branch descending to the vertebrobasilar junction. The posterior inferior cerebellar arteries and cells are widely patent. The basilar artery is widely patent. Anterior inferior cerebral arteries and superior cerebral arteries are patent bilaterally. P1 and P2 segments well opacified without significant stenosis or occlusion. Small posterior communicating arteries present bilaterally, larger on the right.  No aneurysm or vascular malformation.  IMPRESSION: Normal MRA of the brain. No proximal branch occlusion or hemodynamically significant stenosis identified.   Electronically Signed   By: Rise Mu M.D.   On: 05/01/2014 06:18    Microbiology: No results found for this or any previous visit (from the past 240 hour(s)).   Labs: Basic Metabolic Panel:  Recent Labs Lab 04/30/14 1704  NA 141  K 3.5  CL 105  CO2 32  GLUCOSE 104*  BUN 12  CREATININE 1.15  CALCIUM 9.6   Liver Function Tests:  Recent Labs Lab 04/30/14 1704  AST 21  ALT 20  ALKPHOS 67  BILITOT 0.4  PROT 6.9  ALBUMIN 4.1   No results for input(s): LIPASE, AMYLASE in the last 168 hours. No results for input(s): AMMONIA in the last 168 hours. CBC:  Recent Labs Lab 04/30/14 1704  WBC 8.6  NEUTROABS 4.7  HGB 17.0  HCT 47.7  MCV 92.1  PLT 207   Cardiac Enzymes: No results for input(s): CKTOTAL,  CKMB, CKMBINDEX, TROPONINI in the last 168 hours. BNP: BNP (last 3 results) No results for input(s): BNP in the last 8760 hours.  ProBNP (last 3 results) No results for input(s): PROBNP in the last 8760 hours.  CBG: No results for input(s): GLUCAP in the last 168 hours.     SignedCalvert Cantor, MD [facility] 05/01/2014, 4:12 PM

## 2014-05-02 MED ORDER — DILTIAZEM HCL ER COATED BEADS 120 MG PO CP24: 120.0000 mg | ORAL_CAPSULE | Freq: Every day | ORAL | Status: DC

## 2014-05-18 ENCOUNTER — Telehealth: Payer: Self-pay | Admitting: *Deleted

## 2014-05-18 NOTE — Telephone Encounter (Signed)
Called patient and left voice message to call back in regards to appointment on Monday 05/22/14.

## 2014-05-22 ENCOUNTER — Encounter: Payer: Self-pay | Admitting: Neurology

## 2014-05-22 ENCOUNTER — Encounter: Payer: Self-pay | Admitting: *Deleted

## 2014-05-22 ENCOUNTER — Ambulatory Visit (INDEPENDENT_AMBULATORY_CARE_PROVIDER_SITE_OTHER): Payer: BLUE CROSS/BLUE SHIELD | Admitting: Neurology

## 2014-05-22 VITALS — BP 139/89 | HR 86 | Ht 69.5 in | Wt 177.0 lb

## 2014-05-22 DIAGNOSIS — I480 Paroxysmal atrial fibrillation: Secondary | ICD-10-CM

## 2014-05-22 DIAGNOSIS — I6340 Cerebral infarction due to embolism of unspecified cerebral artery: Secondary | ICD-10-CM | POA: Diagnosis not present

## 2014-05-22 NOTE — Progress Notes (Signed)
[facility] [address] [place]. [place] [address] [phone]       OFFICE FOLLOW-UP NOTE  [name] Date of Birth:  1965-11-22 Medical Record Number:  [id number]   HPI: 51 year Caucasian male seen today for the first office follow-up visit following hospital admission for stroke. He was admitted on 04/30/14 with sudden onset of right lower facial tingling and found himself biting his lips. He noticed slight speech difficulty as well and drooling of his right lips. He denied any complaints symptoms in the form of headaches, dysphagia, vision difficulties, gait, balance problems or weakness. He had no known prior history of strokes TIAs or neurological problems. He did have a history of atrial fibrillation in the past and had been on aspirin and Cardizem since then. MRI scan of the brain showed her left corona radiata infarct and MRA should of the brain and extracranial carotid Dopplers did not reveal significant stenosis. Basic metabolic panel labs, LFTs and CBC are normal. Patient was initially reluctant to start anticoagulation but after discussion with his family he agreed to take Xarelto. Transthoracic echo was unremarkable He was also started on Cardizem for rate control of hypertension. Patient's facial droop recovered fairly quickly. He is at present at home and has no neurological deficits. He has been complaining of some minor intermittent headaches which are not bothersome. Patient works as a Hydrographic surveyor and has been told that he cannot drive for up to a year and he wants to contest this as he has no significant deficits. I agree with him since his stroke related deficits to begin with never compromises ability to drive plus he has made a full recovery from that. Is tolerating Xarelto without bleeding bruising or other side effects.  ROS:   14 system review of systems is positive for  Chills, headache, joint pain and all other systems negative PMH:   Past Medical History  Diagnosis Date  . Allergy   . Neuromuscular disorder     Social History:  History   Social History  . Marital Status: Married    Spouse Name: N/A  . Number of Children: N/A  . Years of Education: N/A   Occupational History  . Not on file.   Social History Main Topics  . Smoking status: Current Every Day Smoker -- 1.50 packs/day    Types: Cigarettes  . Smokeless tobacco: Never Used  . Alcohol Use: No  . Drug Use: No  . Sexual Activity: Yes   Other Topics Concern  . Not on file   Social History Narrative    Medications:   Current Outpatient Prescriptions on File Prior to Visit  Medication Sig Dispense Refill  . albuterol (PROVENTIL HFA;VENTOLIN HFA) 108 (90 BASE) MCG/ACT inhaler Inhale 2 puffs into the lungs every 6 (six) hours as needed for wheezing. 1 Inhaler 2  . diltiazem (CARDIZEM CD) 120 MG 24 hr capsule Take 1 capsule (120 mg total) by mouth daily. 30 capsule 0  . rivaroxaban (XARELTO) 20 MG TABS tablet Take 1 tablet (20 mg total) by mouth daily. 30 tablet 0   No current facility-administered medications on file prior to visit.    Allergies:   Allergies  Allergen Reactions  . Vicodin [Hydrocodone-Acetaminophen] Rash  . Celebrex [Celecoxib]   . Peanut-Containing Drug Products Hives    Breathing     Physical Exam General: well developed, well nourished middle-aged Caucasian male, seated, in no evident distress Head: head normocephalic and atraumatic.  Neck:  supple with no carotid or supraclavicular bruits Cardiovascular: regular rate and rhythm, no murmurs Musculoskeletal: no deformity Skin:  no rash/petichiae Vascular:  Normal pulses all extremities Filed Vitals:   05/22/14 1550  BP: 139/89  Pulse: 86   Neurologic Exam Mental Status: Awake and fully alert. Oriented to place and time. Recent and remote memory intact. Attention span, concentration and fund of knowledge appropriate. Mood and affect appropriate.  Cranial  Nerves: Fundoscopic exam reveals sharp disc margins. Pupils equal, briskly reactive to light. Extraocular movements full without nystagmus. Visual fields full to confrontation. Hearing intact. Facial sensation intact. Face, tongue, palate moves normally and symmetrically.  Motor: Normal bulk and tone. Normal strength in all tested extremity muscles. Sensory.: intact to touch ,pinprick .position and vibratory sensation.  Coordination: Rapid alternating movements normal in all extremities. Finger-to-nose and heel-to-shin performed accurately bilaterally. Gait and Station: Arises from chair without difficulty. Stance is normal. Gait demonstrates normal stride length and balance . Able to heel, toe and tandem walk without difficulty.  Reflexes: 1+ and symmetric. Toes downgoing.   NIHSS  0 Modified Rankin  0  ASSESSMENT: [age] year Caucasian male with right facial weakness secondary to embolic left corona  radiata infarct with history of paroxysmal atrial fibrillation. Patient has done remarkably well with full neurological recovery with no lasting neurological deficits.    PLAN: I had a long d/w patient about his recent stroke, risk for recurrent stroke/TIAs, personally independently reviewed imaging studies and stroke evaluation results and answered questions.Continue xarelto  for secondary stroke prevention  given h/o PAFand maintain strict control of hypertension with blood pressure goal below 130/90, diabetes with hemoglobin A1c goal below 6.5% and lipids with LDL cholesterol goal below 100 mg/dL. I also advised the patient to eat a healthy diet with plenty of whole grains, cereals, fruits and vegetables, exercise regularly and maintain ideal body weight Followup in the future with me in 6 months. Patient should be allowed to drive personal and commercial vehicles now in my opinion as he did not have significant physical deficits at the onset of his strokes to begin with and has made a complete  neurological recovery.    Note: This document was prepared with digital dictation and possible smart phrase technology. Any transcriptional errors that result from this process are unintentional

## 2014-05-22 NOTE — Patient Instructions (Signed)
I had a long d/w patient about his recent stroke, risk for recurrent stroke/TIAs, personally independently reviewed imaging studies and stroke evaluation results and answered questions.Continue xarelto  for secondary stroke prevention  given h/o PAFand maintain strict control of hypertension with blood pressure goal below 130/90, diabetes with hemoglobin A1c goal below 6.5% and lipids with LDL cholesterol goal below 100 mg/dL. I also advised the patient to eat a healthy diet with plenty of whole grains, cereals, fruits and vegetables, exercise regularly and maintain ideal body weight Followup in the future with me in 6 months. 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 Stroke Prevention Some medical conditions and behaviors are associated with an increased chance of having a stroke. You may prevent a stroke by making healthy choices and managing medical conditions. HOW CAN I REDUCE MY RISK OF HAVING A STROKE?   Stay physically active. Get at least 30 minutes of activity on most or all days.  Do not smoke. It may also be helpful to avoid exposure to secondhand smoke.  Limit alcohol use. Moderate alcohol use is considered to be:  No more than 2 drinks per day for men.  No more than 1 drink per day for nonpregnant women.  Eat healthy foods. This involves:  Eating 5 or more servings of fruits and vegetables a day.  Making dietary changes that address high blood pressure (hypertension), high cholesterol, diabetes, or obesity.  Manage your cholesterol levels.  Making food choices that are high in fiber and low in saturated fat, trans fat, and cholesterol may control cholesterol levels.  Take any prescribed medicines to control cholesterol as directed by your health care provider.  Manage your diabetes.  Controlling  your carbohydrate and sugar intake is recommended to manage diabetes.  Take any prescribed medicines to control diabetes as directed by your health care provider.  Control your hypertension.  Making food choices that are low in salt (sodium), saturated fat, trans fat, and cholesterol is recommended to manage hypertension.  Take any prescribed medicines to control hypertension as directed by your health care provider.  Maintain a healthy weight.  Reducing calorie intake and making food choices that are low in sodium, saturated fat, trans fat, and cholesterol are recommended to manage weight.  Stop drug abuse.  Avoid taking birth control pills.  Talk to your health care provider about the risks of taking birth control pills if you are over 51 years old, smoke, get migraines, or have ever had a blood clot.  Get evaluated for sleep disorders (sleep apnea).  Talk to your health care provider about getting a sleep evaluation if you snore a lot or have excessive sleepiness.  Take medicines only as directed by your health care provider.  For some people, aspirin or blood thinners (anticoagulants) are helpful in reducing the risk of forming abnormal blood clots that can lead to stroke. If you have the irregular heart rhythm of atrial fibrillation, you should be on a blood thinner unless there is a good reason you cannot take them.  Understand all your medicine instructions.  Make sure that other conditions (such as anemia or atherosclerosis) are addressed. SEEK IMMEDIATE MEDICAL CARE IF:   You have sudden weakness or numbness of the face, arm, or leg, especially on one side of the body.  Your face or eyelid  droops to one side.  You have sudden confusion.  You have trouble speaking (aphasia) or understanding.  You have sudden trouble seeing in one or both eyes.  You have sudden trouble walking.  You have dizziness.  You have a loss of balance or coordination.  You have a  sudden, severe headache with no known cause.  You have new chest pain or an irregular heartbeat. Any of these symptoms may represent a serious problem that is an emergency. Do not wait to see if the symptoms will go away. Get medical help at once. Call your local emergency services (911 in [place] Do not drive yourself to the hospital. Document Released: 04/03/2004 Document Revised: 07/11/2013 Document Reviewed: 08/27/2012 [facility] Patient Information 2015 [employer] [place], [place]. This information is not intended to replace advice given to you by your health care provider. Make sure you discuss any questions you have with your health care provider.

## 2014-05-23 ENCOUNTER — Telehealth: Payer: Self-pay | Admitting: *Deleted

## 2014-05-23 NOTE — Telephone Encounter (Signed)
[facility] [facility] Pearlean BrownieSethi 05-23-14.

## 2014-05-23 NOTE — Telephone Encounter (Signed)
Patient is calling because he left some disability paperwork to be filled out 3/14.  He stated be made a mistake and left the paperwork with the wrong doctor.  He would like to ask that we not fill that out and he would like a refund if we have not done so.  Please call him.  Thanks!

## 2014-05-24 NOTE — Telephone Encounter (Signed)
Form,[employer] patient Pcp will complete,mailed back to patient 05-24-14.

## 2014-09-02 ENCOUNTER — Emergency Department (HOSPITAL_COMMUNITY)
Admission: EM | Admit: 2014-09-02 | Discharge: 2014-09-02 | Disposition: A | Discharge status: Home / Self Care | Payer: BLUE CROSS/BLUE SHIELD | Attending: Emergency Medicine | Admitting: Emergency Medicine

## 2014-09-02 ENCOUNTER — Emergency Department (HOSPITAL_COMMUNITY): Payer: BLUE CROSS/BLUE SHIELD

## 2014-09-02 ENCOUNTER — Emergency Department (HOSPITAL_COMMUNITY): Payer: Self-pay

## 2014-09-02 ENCOUNTER — Encounter (HOSPITAL_COMMUNITY): Payer: Self-pay | Admitting: *Deleted

## 2014-09-02 DIAGNOSIS — Z79899 Other long term (current) drug therapy: Secondary | ICD-10-CM | POA: Insufficient documentation

## 2014-09-02 DIAGNOSIS — Z8669 Personal history of other diseases of the nervous system and sense organs: Secondary | ICD-10-CM | POA: Insufficient documentation

## 2014-09-02 DIAGNOSIS — Z720 Tobacco use: Secondary | ICD-10-CM | POA: Insufficient documentation

## 2014-09-02 DIAGNOSIS — I639 Cerebral infarction, unspecified: Secondary | ICD-10-CM | POA: Insufficient documentation

## 2014-09-02 HISTORY — DX: Unspecified atrial fibrillation: I48.91

## 2014-09-02 HISTORY — DX: Cerebral infarction, unspecified: I63.9

## 2014-09-02 LAB — I-STAT CHEM 8, ED
BUN: 13 mg/dL (ref 6–20)
Calcium, Ion: 1.14 mmol/L (ref 1.12–1.23)
Chloride: 107 mmol/L (ref 101–111)
Creatinine, Ser: 1.1 mg/dL (ref 0.61–1.24)
Glucose, Bld: 107 mg/dL — ABNORMAL HIGH (ref 65–99)
HCT: 51 % (ref 39.0–52.0)
Hemoglobin: 17.3 g/dL — ABNORMAL HIGH (ref 13.0–17.0)
Potassium: 3.5 mmol/L (ref 3.5–5.1)
Sodium: 138 mmol/L (ref 135–145)
TCO2: 26 mmol/L (ref 0–100)

## 2014-09-02 LAB — COMPREHENSIVE METABOLIC PANEL
ALT: 17 U/L (ref 17–63)
AST: 19 U/L (ref 15–41)
Albumin: 3.8 g/dL (ref 3.5–5.0)
Alkaline Phosphatase: 60 U/L (ref 38–126)
Anion gap: 7 (ref 5–15)
BUN: 11 mg/dL (ref 6–20)
CO2: 27 mmol/L (ref 22–32)
Calcium: 9.1 mg/dL (ref 8.9–10.3)
Chloride: 105 mmol/L (ref 101–111)
Creatinine, Ser: 1.12 mg/dL (ref 0.61–1.24)
GFR calc Af Amer: >60 mL/min (ref >60)
GFR calc non Af Amer: >60 mL/min (ref >60)
Glucose, Bld: 110 mg/dL — ABNORMAL HIGH (ref 65–99)
Potassium: 3.5 mmol/L (ref 3.5–5.1)
Sodium: 139 mmol/L (ref 135–145)
Total Bilirubin: 0.6 mg/dL (ref 0.3–1.2)
Total Protein: 6.6 g/dL (ref 6.5–8.1)

## 2014-09-02 LAB — PROTIME-INR
INR: 1.89 — ABNORMAL HIGH (ref 0.00–1.49)
Prothrombin Time: 21.7 seconds — ABNORMAL HIGH (ref 11.6–15.2)

## 2014-09-02 LAB — CBC
HCT: 47.6 % (ref 39.0–52.0)
Hemoglobin: 16.9 g/dL (ref 13.0–17.0)
MCH: 32.2 pg (ref 26.0–34.0)
MCHC: 35.5 g/dL (ref 30.0–36.0)
MCV: 90.7 fL (ref 78.0–100.0)
Platelets: 196 10*3/uL (ref 150–400)
RBC: 5.25 MIL/uL (ref 4.22–5.81)
RDW: 12.8 % (ref 11.5–15.5)
WBC: 7.7 10*3/uL (ref 4.0–10.5)

## 2014-09-02 LAB — DIFFERENTIAL
Basophils Absolute: 0 10*3/uL (ref 0.0–0.1)
Basophils Relative: 1 % (ref 0–1)
Eosinophils Absolute: 0.2 10*3/uL (ref 0.0–0.7)
Eosinophils Relative: 3 % (ref 0–5)
Lymphocytes Relative: 31 % (ref 12–46)
Lymphs Abs: 2.4 10*3/uL (ref 0.7–4.0)
Monocytes Absolute: 0.6 10*3/uL (ref 0.1–1.0)
Monocytes Relative: 8 % (ref 3–12)
Neutro Abs: 4.4 10*3/uL (ref 1.7–7.7)
Neutrophils Relative %: 57 % (ref 43–77)

## 2014-09-02 LAB — CBG MONITORING, ED: Glucose-Capillary: 120 mg/dL — ABNORMAL HIGH (ref 65–99)

## 2014-09-02 LAB — I-STAT TROPONIN, ED: Troponin i, poc: 0 ng/mL (ref 0.00–0.08)

## 2014-09-02 LAB — APTT: aPTT: 36 seconds (ref 24–37)

## 2014-09-02 MED ORDER — GADOBENATE DIMEGLUMINE 529 MG/ML IV SOLN: 17.0000 mL | Freq: Once | INTRAVENOUS | Status: DC | PRN

## 2014-09-02 NOTE — Discharge Instructions (Signed)
Stroke Prevention Some medical conditions and behaviors are associated with an increased chance of having a stroke. You may prevent a stroke by making healthy choices and managing medical conditions. HOW CAN I REDUCE MY RISK OF HAVING A STROKE?   Stay physically active. Get at least 30 minutes of activity on most or all days.  Do not smoke. It may also be helpful to avoid exposure to secondhand smoke.  Limit alcohol use. Moderate alcohol use is considered to be:  No more than 2 drinks per day for men.  No more than 1 drink per day for nonpregnant women.  Eat healthy foods. This involves:  Eating 5 or more servings of fruits and vegetables a day.  Making dietary changes that address high blood pressure (hypertension), high cholesterol, diabetes, or obesity.  Manage your cholesterol levels.  Making food choices that are high in fiber and low in saturated fat, trans fat, and cholesterol may control cholesterol levels.  Take any prescribed medicines to control cholesterol as directed by your health care provider.  Manage your diabetes.  Controlling your carbohydrate and sugar intake is recommended to manage diabetes.  Take any prescribed medicines to control diabetes as directed by your health care provider.  Control your hypertension.  Making food choices that are low in salt (sodium), saturated fat, trans fat, and cholesterol is recommended to manage hypertension.  Take any prescribed medicines to control hypertension as directed by your health care provider.  Maintain a healthy weight.  Reducing calorie intake and making food choices that are low in sodium, saturated fat, trans fat, and cholesterol are recommended to manage weight.  Stop drug abuse.  Avoid taking birth control pills.  Talk to your health care provider about the risks of taking birth control pills if you are over 35 years old, smoke, get migraines, or have ever had a blood clot.  Get evaluated for sleep  disorders (sleep apnea).  Talk to your health care provider about getting a sleep evaluation if you snore a lot or have excessive sleepiness.  Take medicines only as directed by your health care provider.  For some people, aspirin or blood thinners (anticoagulants) are helpful in reducing the risk of forming abnormal blood clots that can lead to stroke. If you have the irregular heart rhythm of atrial fibrillation, you should be on a blood thinner unless there is a good reason you cannot take them.  Understand all your medicine instructions.  Make sure that other conditions (such as anemia or atherosclerosis) are addressed. SEEK IMMEDIATE MEDICAL CARE IF:   You have sudden weakness or numbness of the face, arm, or leg, especially on one side of the body.  Your face or eyelid droops to one side.  You have sudden confusion.  You have trouble speaking (aphasia) or understanding.  You have sudden trouble seeing in one or both eyes.  You have sudden trouble walking.  You have dizziness.  You have a loss of balance or coordination.  You have a sudden, severe headache with no known cause.  You have new chest pain or an irregular heartbeat. Any of these symptoms may represent a serious problem that is an emergency. Do not wait to see if the symptoms will go away. Get medical help at once. Call your local emergency services (911 in [place] Do not drive yourself to the hospital. Document Released: 04/03/2004 Document Revised: 07/11/2013 Document Reviewed: 08/27/2012 [employer] Patient Information 2015 [employer]. This information is not intended to replace advice given   to you by your health care provider. Make sure you discuss any questions you have with your health care provider.  

## 2014-09-02 NOTE — ED Notes (Signed)
Pt had stroke in 04/2014 and only deficit was right side facial droop.  Pt now states since yesterday about 1200 he said left leg was numb and wasn't working right and states he was off-balance.  Pt states went to bed last with symptoms and states he has had some problems with left arm.  Pt has no problem with strength or speech, but still feels off balance

## 2014-09-02 NOTE — ED Provider Notes (Signed)
CSN: [id number]     Arrival date & time 09/02/14  1224 History   First MD Initiated Contact with Patient 09/02/14 1249     Chief Complaint  Patient presents with  . Cerebrovascular Accident     (Consider location/radiation/quality/duration/timing/severity/associated sxs/prior Treatment) Patient is a 49 y.o. male presenting with Acute Neurological Problem. The history is provided by the patient.  Cerebrovascular Accident This is a new problem. Pertinent negatives include no chest pain, no abdominal pain, no headaches and no shortness of breath.   patient had a stroke back in February. Presents today with some weakness. States that by some difficulty walking and that his left leg feels weak. States he was there all day yesterday. Also states he may be having trouble with his left hand. No back pain. No headache. No confusion. He is on Xarelto for atrial fibrillation. No loss of bladder bowel control. No numbness.  Past Medical History  Diagnosis Date  . Allergy   . Neuromuscular disorder   . Stroke   . Atrial fibrillation    Past Surgical History  Procedure Laterality Date  . Cardiac catheterization     Family History  Problem Relation Age of Onset  . CVA Father    History  Substance Use Topics  . Smoking status: Current Every Day Smoker -- 1.50 packs/day    Types: Cigarettes  . Smokeless tobacco: Never Used  . Alcohol Use: No    Review of Systems  Constitutional: Negative for activity change and appetite change.  Eyes: Negative for pain.  Respiratory: Negative for chest tightness and shortness of breath.   Cardiovascular: Negative for chest pain and leg swelling.  Gastrointestinal: Negative for nausea, vomiting, abdominal pain and diarrhea.  Genitourinary: Negative for flank pain.  Musculoskeletal: Negative for back pain and neck stiffness.  Skin: Negative for rash.  Neurological: Positive for weakness. Negative for numbness and headaches.  Psychiatric/Behavioral:  Negative for behavioral problems.      Allergies  Peanut-containing drug products  Home Medications   Prior to Admission medications   Medication Sig Start Date End Date Taking? Authorizing Provider  acetaminophen (TYLENOL) 500 MG tablet Take 1,000 mg by mouth every 6 (six) hours as needed.   Yes Historical Provider, MD  diltiazem (CARDIZEM CD) 120 MG 24 hr capsule Take 1 capsule (120 mg total) by mouth daily. 05/02/14  Yes Calvert Cantor, MD  rivaroxaban (XARELTO) 20 MG TABS tablet Take 1 tablet (20 mg total) by mouth daily. 05/01/14  Yes Calvert Cantor, MD  albuterol (PROVENTIL HFA;VENTOLIN HFA) 108 (90 BASE) MCG/ACT inhaler Inhale 2 puffs into the lungs every 6 (six) hours as needed for wheezing. 03/15/12   Clanford L Johnson, MD   BP 114/85 mmHg  Pulse 71  Temp(Src) 98.1 F (36.7 C) (Oral)  Resp 13  Ht 5\' 8"  (1.727 m)  Wt 174 lb (78.926 kg)  BMI 26.46 kg/m2  SpO2 98% Physical Exam  Constitutional: He is oriented to person, place, and time. He appears well-developed and well-nourished.  HENT:  Head: Normocephalic and atraumatic.  Eyes: EOM are normal. Pupils are equal, round, and reactive to light.  Neck: Normal range of motion. Neck supple.  Cardiovascular: Normal rate, regular rhythm and normal heart sounds.   No murmur heard. Pulmonary/Chest: Effort normal and breath sounds normal.  Abdominal: Soft. Bowel sounds are normal. He exhibits no distension and no mass. There is no tenderness. There is no rebound and no guarding.  Musculoskeletal: Normal range of motion. He exhibits no edema.  Neurological: He is alert and oriented to person, place, and time. No cranial nerve deficit.  Somewhat flat-footed gait on left side. Slight difficulty with walking. Good straight leg raise bilaterally. Good flexion extension at knee. Sensation tract over both feet. Finger-nose intact bilaterally. Good strength and flexion extension on bilateral upper extremities. Extraocular was intact. No  nystagmus. Pupils round and reactive.  Skin: Skin is warm and dry.  Psychiatric: He has a normal mood and affect.  Nursing note and vitals reviewed.   ED Course  Procedures (including critical care time) Labs Review Labs Reviewed  PROTIME-INR - Abnormal; Notable for the following:    Prothrombin Time 21.7 (*)    INR 1.89 (*)    All other components within normal limits  COMPREHENSIVE METABOLIC PANEL - Abnormal; Notable for the following:    Glucose, Bld 110 (*)    All other components within normal limits  I-STAT CHEM 8, ED - Abnormal; Notable for the following:    Glucose, Bld 107 (*)    Hemoglobin 17.3 (*)    All other components within normal limits  CBG MONITORING, ED - Abnormal; Notable for the following:    Glucose-Capillary 120 (*)    All other components within normal limits  APTT  CBC  DIFFERENTIAL  I-STAT TROPOININ, ED    Imaging Review Ct Head (brain) Wo Contrast  09/02/2014   CLINICAL DATA:  Left leg weakness and unsteady gait. Patient states stroke in February.  EXAM: CT HEAD WITHOUT CONTRAST  TECHNIQUE: Contiguous axial images were obtained from the base of the skull through the vertex without intravenous contrast.  COMPARISON:  MRI 04/30/2014  FINDINGS: Ventricles, cisterns and other CSF spaces are within normal. There is evidence a patient's small old linear infarct over the left centrum semiovale. There is a new focal oval hypodensity adjacent the posterior body of the right lateral ventricle measuring 6 x 10 mm likely a focus of ischemic change which may be acute to chronic in nature. Demyelinating processes such as MS should also be considered in this location with this Dawson finger type morphology. There is no evidence of focal mass, mass effect, shift of midline structures or acute hemorrhage. Remaining bones and soft tissues are within normal.  IMPRESSION: Small linear hypodensity over the left centrum semiovale unchanged. Oval hypodensity in the right  periventricular white matter which may be due to acute versus chronic ischemic change. Would also consider demyelinating processes such as multiple sclerosis.   Electronically Signed   By: Elberta Fortis M.D.   On: 09/02/2014 13:34   Mr Laqueta Jean YB Contrast  09/02/2014   CLINICAL DATA:  Left leg numbness and weakness beginning yesterday. Some involvement of the left arm as well. Prior stroke in 04/2014.  EXAM: MRI HEAD WITHOUT AND WITH CONTRAST  TECHNIQUE: Multiplanar, multiecho pulse sequences of the brain and surrounding structures were obtained without and with intravenous contrast.  CONTRAST:  17 mL MultiHance  COMPARISON:  Head CT 09/02/2014 and MRI 04/30/2014  FINDINGS: There is a 1.6 cm acute infarct in the right corona radiata corresponding to the hypodensity described on CT earlier today. A small, chronic infarct is present in the left centrum semiovale, acute on the prior MRI. There is no evidence of intracranial hemorrhage, mass, midline shift, or extra-axial fluid collection. Ventricles and sulci are normal. A few small foci of cerebral white matter T2 hyperintensity elsewhere are unchanged and nonspecific. No abnormal enhancement is identified.  Orbits are unremarkable. No significant inflammatory disease is  seen in the paranasal sinuses or mastoid air cells. Major intracranial vascular flow voids are preserved.  IMPRESSION: 1. Small, acute infarct in the right corona radiata. 2. Chronic left centrum semiovale infarct.   Electronically Signed   By: Sebastian Ache   On: 09/02/2014 15:23     EKG Interpretation   Date/Time:  Saturday September 02 2014 12:45:12 EDT Ventricular Rate:  82 PR Interval:  142 QRS Duration: 102 QT Interval:  386 QTC Calculation: 450 R Axis:   -83 Text Interpretation:  Normal sinus rhythm Pulmonary disease pattern Left  anterior fascicular block Abnormal ECG Confirmed by Rubin Payor  MD, Harrold Donath  [phone]) on 09/02/2014 3:29:25 PM      MDM   Final diagnoses:  Acute CVA  (cerebrovascular accident)   Patient with acute stroke. Onset yesterday. Artery on Xarelto. Discussed with neurology and has had complete workup already. No weight to increase anticoagulation. Overall relatively minor deficit. Will discharge home follow-up with his urologist since patient does not want admission to the hospital.    Benjiman Core, MD 09/02/14 [phone]

## 2014-09-05 ENCOUNTER — Ambulatory Visit (INDEPENDENT_AMBULATORY_CARE_PROVIDER_SITE_OTHER): Payer: Self-pay | Admitting: Neurology

## 2014-09-05 ENCOUNTER — Encounter: Payer: Self-pay | Admitting: Neurology

## 2014-09-05 VITALS — BP 144/85 | HR 85 | Temp 97.9°F | Ht 68.0 in | Wt 172.2 lb

## 2014-09-05 DIAGNOSIS — I638 Other cerebral infarction: Secondary | ICD-10-CM

## 2014-09-05 DIAGNOSIS — I6389 Other cerebral infarction: Secondary | ICD-10-CM

## 2014-09-05 MED ORDER — ATORVASTATIN CALCIUM 10 MG PO TABS: 10.0000 mg | ORAL_TABLET | Freq: Every day | ORAL | Status: DC

## 2014-09-05 MED ORDER — TRAMADOL HCL 50 MG PO TABS: 50.0000 mg | ORAL_TABLET | Freq: Two times a day (BID) | ORAL | Status: DC | PRN

## 2014-09-05 NOTE — Progress Notes (Addendum)
[facility]    Provider:  Dr Lucia Gaskins Referring Provider: No ref. provider found Primary Care Physician:  Neldon Labella, MD  CC:  Stroke f/u  HPI:  Frank Silva is a 49 y.o. male here as a referral for stroke follow up. He has a PMHx of non compliance, CAD, overweight, also with afib and is now on Xarelto but was not on antithrombotic when he first presented with facial weakness in February of this year. He was eating dinner and began biting his lip, noticed facial weakness and presented to the ED in February of this year.  He was not administered TPA due to late presentation. Notes in the ED stated mild right facial droop. MRi showed small, acute infarct in the left centrum semiovale. He was startedon Xarelto.  Symptoms resolved in the hospital. In late June, he was not compliant with Xarelto and presented again with left leg numbness, weakness, balance issues. MRi of the brain showed new lesion,  Small, acute infarct in the right corona radiata as well as the chronic left centrum semiovale infarct.  He is a current smoker, is currently not taking his cholesterol medication, and he missed a dosage of Xarelto prior to his most recent stroke. He reports all his symptoms have resolved. He is still smoking, trying to quit. He does not want to take his lipitor, had a long discussion with patient on compliance with medication. He is out of work and has no insurance at the moment and declines any further workup. Explained that strokes could be due to embolic disease but he also has multiple vascular risk factors and is non compliant with medical management that puts him at risk for small-vessel ischemic etiology.   He is also having arthritic pain. OTC meds have not worked.   Reviewed notes, labs and imaging from outside physicians, which showed:  tsh wnl, ldl 90.   MRi of the brain 04/30/2014: Small, acute infarct in the left centrum semiovale. (personally reviewed images). MRA  of the head was normal. MRi of the brain 09/02/2014  Small, acute infarct in the right corona radiata. Chronic left centrum semiovale infarct.   Review of Systems: Patient complains of symptoms per HPI as well as the following symptoms: No CP, no SOB, no fevers or chills or nausea, no bowel or bladder changes. Chills, restless legs, insomnia, daytime sleepiness, joint pain, joint swelling, back pain, walking difficulty, dizziness, headache Pertinent negatives per HPI. All others negative.   History   Social History  . Marital Status: Married    Spouse Name: [name]  . Number of Children: N/A  . Years of Education: 12   Occupational History  . Unemployed    Social History Main Topics  . Smoking status: Current Every Day Smoker -- 1.50 packs/day    Types: Cigarettes  . Smokeless tobacco: Never Used  . Alcohol Use: No  . Drug Use: No  . Sexual Activity: Yes   Other Topics Concern  . Not on file   Social History Narrative   Lives at home with wife.   Caffeine use: Drink coke (5-6 glasses per day)    Family History  Problem Relation Age of Onset  . CVA Father     Past Medical History  Diagnosis Date  . Allergy   . Neuromuscular disorder   . Stroke   . Atrial fibrillation     Past Surgical History  Procedure Laterality Date  . Cardiac catheterization      Current Outpatient  Prescriptions  Medication Sig Dispense Refill  . acetaminophen (TYLENOL) 500 MG tablet Take 1,000 mg by mouth every 6 (six) hours as needed.    Marland Kitchen albuterol (PROVENTIL HFA;VENTOLIN HFA) 108 (90 BASE) MCG/ACT inhaler Inhale 2 puffs into the lungs every 6 (six) hours as needed for wheezing. 1 Inhaler 2  . diltiazem (CARDIZEM CD) 120 MG 24 hr capsule Take 1 capsule (120 mg total) by mouth daily. 30 capsule 0  . rivaroxaban (XARELTO) 20 MG TABS tablet Take 1 tablet (20 mg total) by mouth daily. 30 tablet 0   No current facility-administered medications for this visit.    Allergies as of 09/05/2014 -  Review Complete 09/05/2014  Allergen Reaction Noted  . Peanut-containing drug products Hives and Shortness Of Breath 04/23/2012    Vitals: BP 144/85 mmHg  Pulse 85  Temp(Src) 97.9 F (36.6 C) (Oral)  Ht  (1.727 m)  Wt 172 lb 3.2 oz (78.109 kg)  BMI 26.19 kg/m2 Last Weight:  Wt Readings from Last 1 Encounters:  09/05/14 172 lb 3.2 oz (78.109 kg)   Last Height:   Ht Readings from Last 1 Encounters:  09/05/14  (1.727 m)   Physical exam: Exam: Gen: NAD, conversant, well nourised, overweight, well groomed                     CV: RRR, no MRG. No Carotid Bruits. No peripheral edema, warm, nontender Eyes: Conjunctivae clear without exudates or hemorrhage  Neuro: Detailed Neurologic Exam  Speech:    Speech is normal; fluent and spontaneous with normal comprehension.  Cognition:    The patient is oriented to person, place, and time;     recent and remote memory intact;     language fluent;     normal attention, concentration,     fund of knowledge Cranial Nerves:    The pupils are equal, round, and reactive to light. The fundi are normal and spontaneous venous pulsations are present. Visual fields are full to finger confrontation. Extraocular movements are intact. Trigeminal sensation is intact and the muscles of mastication are normal. The face is symmetric. The palate elevates in the midline. Hearing intact. Voice is normal. Shoulder shrug is normal. The tongue has normal motion without fasciculations.   Coordination:    Normal finger to nose and heel to shin. Normal rapid alternating movements.   Gait:    Heel-toe and tandem gait are normal.   Motor Observation:    No asymmetry, no atrophy, and no involuntary movements noted. Tone:    Normal muscle tone.    Posture:    Posture is normal. normal erect    Strength:    Strength is V/V in the upper and lower limbs.      Sensation: intact to LT     Reflex Exam:  DTR's:    Deep tendon reflexes in the upper  and lower extremities are symmetrical bilaterally.   Toes:    The toes are downgoing bilaterally.   Clonus:    Clonus is absent.       Assessment/Plan:    Frank Silva is a 49 y.o. male here as a referral for stroke follow up. He has a PMHx of non compliance, CAD, overweight, also with afib and is now on Xarelto. He was not on antithrombotics when he first presented with facial weakness in February of this year.  MRi showed small, acute infarct in the left centrum semiovale. He was startedon Xarelto.  In late  June, he was not compliant with Xarelto and presented again with left leg numbness, weakness, balance issues. MRi of the brain showed new lesion,  Small, acute infarct in the right corona radiata as well as the chronic left centrum semiovale infarct.  He is a current smoker, is currently not taking his cholesterol medication, and he missed a dosage of Xarelto prior to his most recent stroke. Had a long discussion with patient on compliance with medication. He is out of work and has no insurance at the moment and declines any further workup. Explained that strokes could be due to embolic disease but he also has multiple vascular risk factors and is non compliant with medical management that puts him at risk for small-vessel ischemic etiology.   Highly encouraged medical compliance, restarting statin. Needs close follow up with primary care for management of vascular risk factors. He is having some arthritic pain, will give limited dose of tramadol but he needs to follow with a primary care. Suggested hypercoag workup, he declined   Naomie DeanAntonia Xenia Nile, MD  [facility] [address] [place] [id number]  Phone [phone] Fax [phone]

## 2014-09-05 NOTE — Patient Instructions (Signed)
Overall you are doing fairly well but I do want to suggest a few things today:   Remember to drink plenty of fluid, eat healthy meals and do not skip any meals. Try to eat protein with a every meal and eat a healthy snack such as fruit or nuts in between meals. Try to keep a regular sleep-wake schedule and try to exercise daily, particularly in the form of walking, 20-30 minutes a day, if you can.   As far as your medications are concerned, I would like to suggest: Tramadol as prescribed  I would like to see you back as needed, sooner if we need to. Please call us with any interim questions, concerns, problems, updates or refill requests.   Please also call us for any test results so we can go over those with you on the phone.  My clinical assistant and will answer any of your questions and relay your messages to me and also relay most of my messages to you.   Our phone number is [phone]. We also have an after hours call service for urgent matters and there is a physician on-call for urgent questions. For any emergencies you know to call 911 or go to the nearest emergency room

## 2014-10-23 ENCOUNTER — Telehealth: Payer: Self-pay | Admitting: Neurology

## 2014-10-23 NOTE — Telephone Encounter (Signed)
Left VM for pt to call back. I was returning call. Told him I will try to call first this in the morning.

## 2014-10-23 NOTE — Telephone Encounter (Signed)
Pt wants to talk to nurse regarding the way his prescription is written traMADol (ULTRAM) 50 MG tablet

## 2014-10-24 NOTE — Telephone Encounter (Signed)
Called and spoke w/ pt. He has been taking the tramadol not as prescribed. Been taking it every 8 hours or more often than that and not every 12 hr as prescribed. He has now ran out of medication since 10/20/14. He has lost his job and is going for job interviews right now. He states taking it every 12 hours was not helping him and he needed to take more often to help with the pain. He has one more refill on medication, but not until 11/05/14. He is wondering if Dr. Lucia Gaskins can send in new Rx so he can take medication sooner than every 12 hours. I advised him he should be taking medication as prescribed/written by Dr. Lucia Gaskins. He should have called office to make sure it was okay to take every 8 hours or more often. He verbalized understanding and is aware to call the office to make sure Dr. Lucia Gaskins is aware and is okay with him taking the medication this way. Advised him Dr. Lucia Gaskins is out of the office until Thursday and will call him back when I speak with her. He verbalized understanding.

## 2014-10-25 NOTE — Telephone Encounter (Signed)
Left VM for pt to call office back. Advised him I will try to call him again in the morning. Gave [facility] phone number.

## 2014-10-25 NOTE — Telephone Encounter (Signed)
Frank Silva - I won't refill the Tramadol. I advised him at his appiintment that this was a limited quantity and he would have to see his primary care for the arthritic pain. He needs to see his primary case for the pain. I won't approve more tramadol thanks

## 2014-10-26 NOTE — Telephone Encounter (Signed)
Spoke w/ Frank Silva and advised him that Dr. Lucia Gaskins will not refill tramadol because at last appt she advised that it was a limited quantity and he has to see primary care for arthritic pain. He needs to contact primary care. He verbalized understanding.

## 2015-03-30 ENCOUNTER — Ambulatory Visit (INDEPENDENT_AMBULATORY_CARE_PROVIDER_SITE_OTHER): Payer: Self-pay | Admitting: Family Medicine

## 2015-03-30 VITALS — BP 168/100 | HR 85 | Temp 97.7°F | Resp 14 | Ht 68.5 in | Wt 172.4 lb

## 2015-03-30 DIAGNOSIS — I480 Paroxysmal atrial fibrillation: Secondary | ICD-10-CM

## 2015-03-30 DIAGNOSIS — I10 Essential (primary) hypertension: Secondary | ICD-10-CM

## 2015-03-30 DIAGNOSIS — F17200 Nicotine dependence, unspecified, uncomplicated: Secondary | ICD-10-CM

## 2015-03-30 MED ORDER — DILTIAZEM HCL ER COATED BEADS 180 MG PO CP24: 180.0000 mg | ORAL_CAPSULE | Freq: Every day | ORAL | Status: DC

## 2015-03-30 MED ORDER — RIVAROXABAN 20 MG PO TABS: 20.0000 mg | ORAL_TABLET | Freq: Every day | ORAL | Status: DC

## 2015-03-30 NOTE — Progress Notes (Signed)
Patient ID: Frank Silva, male    DOB: [birth year]  Age: 50 y.o. MRN: [id number]  Chief Complaint  Patient presents with  . Medication Refill    xarelto  . Sinusitis    Subjective:   Patient has a history of atrial fibrillation and needs a refill on his Xarelto. He has chronic nasal congestion. He continues to smoke. We talked about that. He has had a couple of strokes, symptoms have resolved. He knows he needs to get away from cigarettes but after his divorce he took them back up and has not been able to kick them. He wonders whether he needs to be on more blood pressure medication.  Current allergies, medications, problem list, past/family and social histories reviewed.  Objective:  BP 168/100 mmHg  Pulse 85  Temp(Src) 97.7 F (36.5 C) (Oral)  Resp 14  Ht 5' 8.5" (1.74 m)  Wt 172 lb 6.4 oz (78.2 kg)  BMI 25.83 kg/m2  SpO2 97%  No carotid bruits. Chest clear. Heart regular without murmur.  Assessment & Plan:   Assessment: 1. Paroxysmal atrial fibrillation (HCC)   2. Essential hypertension   3. Tobacco use disorder       Plan: Discussed stopping smoking.  No orders of the defined types were placed in this encounter.    Meds ordered this encounter  Medications  . diltiazem (CARDIZEM CD) 180 MG 24 hr capsule    Sig: Take 1 capsule (180 mg total) by mouth daily.    Dispense:  90 capsule    Refill:  3  . DISCONTD: rivaroxaban (XARELTO) 20 MG TABS tablet    Sig: Take 1 tablet (20 mg total) by mouth daily.    Dispense:  30 tablet    Refill:  0  . rivaroxaban (XARELTO) 20 MG TABS tablet    Sig: Take 1 tablet (20 mg total) by mouth daily.    Dispense:  30 tablet    Refill:  11         Patient Instructions  Work hard on stopping smoking  Continue to treat your sinuses with over-the-counter medications  Take these relatively low in the same fashion 20 mg daily. Ask the pharmacist about the sample pills.  Return as needed     Return if symptoms  worsen or fail to improve.   Deloma Spindle, MD 03/30/2015

## 2015-03-30 NOTE — Patient Instructions (Signed)
Work hard on stopping smoking  Continue to treat your sinuses with over-the-counter medications  Take these relatively low in the same fashion 20 mg daily. Ask the pharmacist about the sample pills.  Return as needed

## 2015-04-01 ENCOUNTER — Ambulatory Visit (INDEPENDENT_AMBULATORY_CARE_PROVIDER_SITE_OTHER): Payer: Self-pay | Admitting: Physician Assistant

## 2015-04-01 VITALS — BP 140/88 | HR 88 | Temp 98.2°F | Resp 16 | Ht 68.5 in | Wt 169.0 lb

## 2015-04-01 DIAGNOSIS — M25521 Pain in right elbow: Secondary | ICD-10-CM

## 2015-04-01 DIAGNOSIS — M25512 Pain in left shoulder: Secondary | ICD-10-CM

## 2015-04-01 DIAGNOSIS — M25562 Pain in left knee: Secondary | ICD-10-CM

## 2015-04-01 DIAGNOSIS — M25511 Pain in right shoulder: Secondary | ICD-10-CM

## 2015-04-01 DIAGNOSIS — W11XXXA Fall on and from ladder, initial encounter: Secondary | ICD-10-CM

## 2015-04-01 DIAGNOSIS — M545 Low back pain: Secondary | ICD-10-CM

## 2015-04-01 MED ORDER — HYDROCODONE-ACETAMINOPHEN 5-325 MG PO TABS: 1.0000 | ORAL_TABLET | Freq: Four times a day (QID) | ORAL | Status: DC | PRN

## 2015-04-01 MED ORDER — HYDROCODONE-ACETAMINOPHEN 5-325 MG PO TABS: 1.0000 | ORAL_TABLET | Freq: Three times a day (TID) | ORAL | Status: DC | PRN

## 2015-04-01 MED ORDER — METHOCARBAMOL 500 MG PO TABS: 500.0000 mg | ORAL_TABLET | Freq: Four times a day (QID) | ORAL | Status: DC

## 2015-04-01 NOTE — Patient Instructions (Signed)
Please ice the areas 3 times per day for 15 minutes. Take medication as prescribed.  I would like you to return if you are continuing to have worsening pain from this fall--in 1 week.  This will need to be XR as soon as possible.   If you have shortness of breath, trouble breathing, dizziness, etc.--you need to return immediately.

## 2015-04-01 NOTE — Progress Notes (Signed)
[facility] [address] [place] [address] [phone]  Date:  04/01/2015   Name:  Frank Silva   DOB:  May 30, 1965   MRN:  [id number]  PCP:  Neldon Labella, MD   Chief Complaint  Patient presents with  . Fall  . Knee Pain    left  . Wrist Pain    left  . Shoulder Pain    left     History of Present Illness:  Frank Silva is a 50 y.o. male patient who presents to [facility] for cc of right shoulder, elbow, and knee pain following a fall off a ladder.    Ladder cleaning gutters, tennis showes wet and foot went from under him.  He states that his right elbow was twisted in the rungs of the ladder, and he then fell backwards, having his right knee caught in the rung lower, and hanging upside down.  The ladder was moving, and by that point, he was about 6 feet from the ground when he landed directly on his back.  He has no numbness or tingling.  No open lacerations or abrasions.    Elbow sore, and leg twisted and he went upside down.  Left shoulder pain falling directly on the back.  Low back pain.  25 ft, 6 ft off.  Friday after he left here.  No numbness or tingling.     Patient Active Problem List   Diagnosis Date Noted  . PAF (paroxysmal atrial fibrillation) (HCC) 05/22/2014  . 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 Facial droop   . Stroke (HCC)   . Acute CVA (cerebrovascular accident) (HCC) 04/30/2014  . Accelerated hypertension 04/30/2014  . History of Dysrhythmia, cardiac 04/30/2014    Past Medical History  Diagnosis Date  . Allergy   . Neuromuscular disorder (HCC)   . Stroke (HCC)   . Atrial fibrillation (HCC)   . A-fib [facility])     Past Surgical History  Procedure Laterality Date  . Cardiac catheterization      Social History  Substance Use Topics  . Smoking status: Current Every Day Smoker -- 1.50 packs/day    Types: Cigarettes  . Smokeless tobacco: Never Used  . Alcohol Use: No    Family History  Problem Relation  Age of Onset  . CVA Father     Allergies  Allergen Reactions  . Peanut-Containing Drug Products Hives and Shortness Of Breath    Medication list has been reviewed and updated.  Current Outpatient Prescriptions on File Prior to Visit  Medication Sig Dispense Refill  . acetaminophen (TYLENOL) 500 MG tablet Take 1,000 mg by mouth every 6 (six) hours as needed. Reported on 03/30/2015    . diltiazem (CARDIZEM CD) 180 MG 24 hr capsule Take 1 capsule (180 mg total) by mouth daily. 90 capsule 3  . rivaroxaban (XARELTO) 20 MG TABS tablet Take 1 tablet (20 mg total) by mouth daily. 30 tablet 11  . albuterol (PROVENTIL HFA;VENTOLIN HFA) 108 (90 BASE) MCG/ACT inhaler Inhale 2 puffs into the lungs every 6 (six) hours as needed for wheezing. (Patient not taking: Reported on 03/30/2015) 1 Inhaler 2  . atorvastatin (LIPITOR) 10 MG tablet Take 1 tablet (10 mg total) by mouth daily. (Patient not taking: Reported on 03/30/2015) 30 tablet 6  . traMADol (ULTRAM) 50 MG tablet Take 1 tablet (50 mg total) by mouth every 12 (twelve) hours as needed. (Patient not taking: Reported on 03/30/2015) 60 tablet 2  No current facility-administered medications on file prior to visit.    ROS ROS otherwise unremarkable unless listed above.   Physical Examination: BP 140/88 mmHg  Pulse 88  Temp(Src) 98.2 F (36.8 C)  Resp 16  Ht 5' 8.5" (1.74 m)  Wt 169 lb (76.658 kg)  BMI 25.32 kg/m2  SpO2 98% Ideal Body Weight: Weight in (lb) to have BMI = 25: 166.5  Physical Exam  Constitutional: He is oriented to person, place, and time. He appears well-developed and well-nourished. No distress.  HENT:  Head: Normocephalic and atraumatic.  Eyes: Conjunctivae and EOM are normal. Pupils are equal, round, and reactive to light.  Cardiovascular: Normal rate.   Pulmonary/Chest: Effort normal. No respiratory distress.  Musculoskeletal:       Right shoulder: He exhibits bony tenderness. He exhibits normal range of motion, no  swelling and no crepitus.       Right elbow: He exhibits normal range of motion and no swelling. Tenderness found. Medial epicondyle tenderness noted. No lateral epicondyle and no olecranon process tenderness noted.       Left knee: He exhibits normal range of motion, no swelling, no effusion, no laceration, no erythema, no LCL laxity, normal meniscus and no MCL laxity. Tenderness found. Medial joint line and lateral joint line tenderness noted. No patellar tendon tenderness noted.  Neurological: He is alert and oriented to person, place, and time.  Skin: Skin is warm and dry. He is not diaphoretic.  Psychiatric: He has a normal mood and affect. His behavior is normal.     Assessment and Plan: [name] is a 50 y.o. male who is here today Time was spent discussing the importance of imaging and further testing if he is having these pains.  He is on a blood thinner, and concern, and risks were discussed again.  He continues to decline.   I am going to give him a minimal amount for pain.  If continued without improvement, he will have to return.   Alarming symptoms to warrant a more immediate return were discussed.   Fall from ladder, initial encounter - Plan: methocarbamol (ROBAXIN) 500 MG tablet, DISCONTINUED: HYDROcodone-acetaminophen (NORCO) 5-325 MG tablet  Right low back pain, with sciatica presence unspecified - Plan: methocarbamol (ROBAXIN) 500 MG tablet, DISCONTINUED: HYDROcodone-acetaminophen (NORCO) 5-325 MG tablet  Elbow pain, right - Plan: methocarbamol (ROBAXIN) 500 MG tablet, DISCONTINUED: HYDROcodone-acetaminophen (NORCO) 5-325 MG tablet  Pain in joint of right shoulder  Shoulder pain, acute, left - Plan: methocarbamol (ROBAXIN) 500 MG tablet, DISCONTINUED: HYDROcodone-acetaminophen (NORCO) 5-325 MG tablet  Left knee pain - Plan: methocarbamol (ROBAXIN) 500 MG tablet, DISCONTINUED: HYDROcodone-acetaminophen (NORCO) 5-325 MG tablet  Trena Platt, PA-C [facility] 1/31/201710:34 PM

## 2015-04-18 ENCOUNTER — Ambulatory Visit (INDEPENDENT_AMBULATORY_CARE_PROVIDER_SITE_OTHER): Payer: Self-pay | Admitting: Urgent Care

## 2015-04-18 VITALS — BP 142/94 | HR 75 | Temp 97.8°F | Resp 18 | Ht 68.0 in | Wt 164.8 lb

## 2015-04-18 DIAGNOSIS — Z8673 Personal history of transient ischemic attack (TIA), and cerebral infarction without residual deficits: Secondary | ICD-10-CM

## 2015-04-18 DIAGNOSIS — R0602 Shortness of breath: Secondary | ICD-10-CM

## 2015-04-18 DIAGNOSIS — I10 Essential (primary) hypertension: Secondary | ICD-10-CM

## 2015-04-18 DIAGNOSIS — I4891 Unspecified atrial fibrillation: Secondary | ICD-10-CM

## 2015-04-18 DIAGNOSIS — F17200 Nicotine dependence, unspecified, uncomplicated: Secondary | ICD-10-CM

## 2015-04-18 LAB — BASIC METABOLIC PANEL
BUN: 18 mg/dL (ref 7–25)
CO2: 26 mmol/L (ref 20–31)
Calcium: 9.6 mg/dL (ref 8.6–10.3)
Chloride: 103 mmol/L (ref 98–110)
Creat: 1.12 mg/dL (ref 0.70–1.33)
Glucose, Bld: 86 mg/dL (ref 65–99)
Potassium: 4 mmol/L (ref 3.5–5.3)
Sodium: 139 mmol/L (ref 135–146)

## 2015-04-18 MED ORDER — ALBUTEROL SULFATE HFA 108 (90 BASE) MCG/ACT IN AERS: 2.0000 | INHALATION_SPRAY | Freq: Four times a day (QID) | RESPIRATORY_TRACT | Status: DC | PRN

## 2015-04-18 NOTE — Progress Notes (Signed)
    MRN: [id number] DOB: [birth year]  Subjective:   Frank Silva is a 50 y.o. male presenting for chief complaint of Hypertension  Patient is managing his HTN with diltiazem. He was started on this ~11 years ago, has a history of atrial fibrillation with stroke that occurred last year. Patient believes that his a. fib started after he underwent a heart cath for suspicion of MI. The heart cath was normal but since he has had a. fib. He is taking Xarelto for this. He does not have a cardiologist but does see a neurologist Dr. Lucia Gaskins. Patient is uninsured. Of note, patient has tried lisinopril in addition to diltiazem but stopped this due to hypotension. Patient admits headache which he attributes to quitting sodas, cutting back on smoking. He is currently at 4 cigarettes down from 1.5 packs per day. He admits shob intermittently which he attributes to his longstanding history of smoking. Denies lightheadedness, dizziness, double vision, chest pain, heart racing, palpitations, nausea, vomiting, abdominal pain, hematuria, lower leg swelling.   Frank Silva has a current medication list which includes the following prescription(s): acetaminophen, albuterol, diltiazem, and rivaroxaban. Also is allergic to peanut-containing drug products.  Frank Silva  has a past medical history of Allergy; Neuromuscular disorder (HCC); Stroke [facility]); Atrial fibrillation (HCC); and A-fib (HCC). Also  has past surgical history that includes Cardiac catheterization.  Objective:   Vitals: BP 142/94 mmHg  Pulse 75  Temp(Src) 97.8 F (36.6 C) (Oral)  Resp 18  Ht  (1.727 m)  Wt 164 lb 12.8 oz (74.753 kg)  BMI 25.06 kg/m2  SpO2 97%  Physical Exam  Constitutional: He is oriented to person, place, and time. He appears well-developed and well-nourished.  HENT:  Mouth/Throat: Oropharynx is clear and moist.  Eyes: Right eye exhibits no discharge. Left eye exhibits no discharge. No scleral icterus.  Neck: Normal range of motion.  Neck supple. No thyromegaly present.  Cardiovascular: Normal rate, regular rhythm and intact distal pulses.  Exam reveals no gallop and no friction rub.   No murmur heard. Pulmonary/Chest: No respiratory distress. He has wheezes (bibasilar). He has no rales.  Musculoskeletal: He exhibits no edema.  Neurological: He is alert and oriented to person, place, and time.  Skin: Skin is warm and dry.   Assessment and Plan :   1. Essential hypertension 2. Atrial fibrillation, unspecified type (HCC) 3. History of stroke - Patient agreed to Bmet, lab pending, will start patient on additional BP med after results. He did better with  of diltiazem so I recommended patient use this instead of . He agreed. Recheck in 4 weeks.  4. Tobacco use disorder 5. Shortness of breath - Continue efforts at smoking cessation. Refilled his albuterol for shob.   Wallis Bamberg, PA-C [facility] [phone] 04/18/2015 3:33 PM

## 2015-04-18 NOTE — Patient Instructions (Addendum)
- Please continue taking diltiazem  twice daily. - I will follow up with once I've talked with your pharmacy and see your blood test results to make sure we add the appropriate blood pressure medication.   Hypertension Hypertension, commonly called high blood pressure, is when the force of blood pumping through your arteries is too strong. Your arteries are the blood vessels that carry blood from your heart throughout your body. A blood pressure reading consists of a higher number over a lower number, such as 110/72. The higher number (systolic) is the pressure inside your arteries when your heart pumps. The lower number (diastolic) is the pressure inside your arteries when your heart relaxes. Ideally you want your blood pressure below 120/80. Hypertension forces your heart to work harder to pump blood. Your arteries may become narrow or stiff. Having untreated or uncontrolled hypertension can cause heart attack, stroke, kidney disease, and other problems. RISK FACTORS Some risk factors for high blood pressure are controllable. Others are not.  Risk factors you cannot control include:   Race. You may be at higher risk if you are African American.  Age. Risk increases with age.  Gender. Men are at higher risk than women before age 14 years. After age 43, women are at higher risk than men. Risk factors you can control include:  Not getting enough exercise or physical activity.  Being overweight.  Getting too much fat, sugar, calories, or salt in your diet.  Drinking too much alcohol. SIGNS AND SYMPTOMS Hypertension does not usually cause signs or symptoms. Extremely high blood pressure (hypertensive crisis) may cause headache, anxiety, shortness of breath, and nosebleed. DIAGNOSIS To check if you have hypertension, your health care provider will measure your blood pressure while you are seated, with your arm held at the level of your heart. It should be measured at least twice using  the same arm. Certain conditions can cause a difference in blood pressure between your right and left arms. A blood pressure reading that is higher than normal on one occasion does not mean that you need treatment. If it is not clear whether you have high blood pressure, you may be asked to return on a different day to have your blood pressure checked again. Or, you may be asked to monitor your blood pressure at home for 1 or more weeks. TREATMENT Treating high blood pressure includes making lifestyle changes and possibly taking medicine. Living a healthy lifestyle can help lower high blood pressure. You may need to change some of your habits. Lifestyle changes may include:  Following the DASH diet. This diet is high in fruits, vegetables, and whole grains. It is low in salt, red meat, and added sugars.  Keep your sodium intake below 2,300 mg per day.  Getting at least 30-45 minutes of aerobic exercise at least 4 times per week.  Losing weight if necessary.  Not smoking.  Limiting alcoholic beverages.  Learning ways to reduce stress. Your health care provider may prescribe medicine if lifestyle changes are not enough to get your blood pressure under control, and if one of the following is true:  You are 34-38 years of age and your systolic blood pressure is above 140.  You are 39 years of age or older, and your systolic blood pressure is above 150.  Your diastolic blood pressure is above 90.  You have diabetes, and your systolic blood pressure is over 140 or your diastolic blood pressure is over 90.  You have kidney disease and  your blood pressure is above 140/90.  You have heart disease and your blood pressure is above 140/90. Your personal target blood pressure may vary depending on your medical conditions, your age, and other factors. HOME CARE INSTRUCTIONS  Have your blood pressure rechecked as directed by your health care provider.   Take medicines only as directed by your  health care provider. Follow the directions carefully. Blood pressure medicines must be taken as prescribed. The medicine does not work as well when you skip doses. Skipping doses also puts you at risk for problems.  Do not smoke.   Monitor your blood pressure at home as directed by your health care provider. SEEK MEDICAL CARE IF:   You think you are having a reaction to medicines taken.  You have recurrent headaches or feel dizzy.  You have swelling in your ankles.  You have trouble with your vision. SEEK IMMEDIATE MEDICAL CARE IF:  You develop a severe headache or confusion.  You have unusual weakness, numbness, or feel faint.  You have severe chest or abdominal pain.  You vomit repeatedly.  You have trouble breathing. MAKE SURE YOU:   Understand these instructions.  Will watch your condition.  Will get help right away if you are not doing well or get worse.   This information is not intended to replace advice given to you by your health care provider. Make sure you discuss any questions you have with your health care provider.   Document Released: 02/24/2005 Document Revised: 07/11/2014 Document Reviewed: 12/17/2012 Elsevier Interactive Patient Education 2016 [employer].    Smoking Cessation, Tips for Success If you are ready to quit smoking, congratulations! You have chosen to help yourself be healthier. Cigarettes bring nicotine, tar, carbon monoxide, and other irritants into your body. Your lungs, heart, and blood vessels will be able to work better without these poisons. There are many different ways to quit smoking. Nicotine gum, nicotine patches, a nicotine inhaler, or nicotine nasal spray can help with physical craving. Hypnosis, support groups, and medicines help break the habit of smoking. WHAT THINGS CAN I DO TO MAKE QUITTING EASIER?  Here are some tips to help you quit for good:  Pick a date when you will quit smoking completely. Tell all of your  friends and family about your plan to quit on that date.  Do not try to slowly cut down on the number of cigarettes you are smoking. Pick a quit date and quit smoking completely starting on that day.  Throw away all cigarettes.   Clean and remove all ashtrays from your home, work, and car.  On a card, write down your reasons for quitting. Carry the card with you and read it when you get the urge to smoke.  Cleanse your body of nicotine. Drink enough water and fluids to keep your urine clear or pale yellow. Do this after quitting to flush the nicotine from your body.  Learn to predict your moods. Do not let a bad situation be your excuse to have a cigarette. Some situations in your life might tempt you into wanting a cigarette.  Never have "just one" cigarette. It leads to wanting another and another. Remind yourself of your decision to quit.  Change habits associated with smoking. If you smoked while driving or when feeling stressed, try other activities to replace smoking. Stand up when drinking your coffee. Brush your teeth after eating. Sit in a different chair when you read the paper. Avoid alcohol while trying to  quit, and try to drink fewer caffeinated beverages. Alcohol and caffeine may urge you to smoke.  Avoid foods and drinks that can trigger a desire to smoke, such as sugary or spicy foods and alcohol.  Ask people who smoke not to smoke around you.  Have something planned to do right after eating or having a cup of coffee. For example, plan to take a walk or exercise.  Try a relaxation exercise to calm you down and decrease your stress. Remember, you may be tense and nervous for the first 2 weeks after you quit, but this will pass.  Find new activities to keep your hands busy. Play with a pen, coin, or rubber band. Doodle or draw things on paper.  Brush your teeth right after eating. This will help cut down on the craving for the taste of tobacco after meals. You can also try  mouthwash.   Use oral substitutes in place of cigarettes. Try using lemon drops, carrots, cinnamon sticks, or chewing gum. Keep them handy so they are available when you have the urge to smoke.  When you have the urge to smoke, try deep breathing.  Designate your home as a nonsmoking area.  If you are a heavy smoker, ask your health care provider about a prescription for nicotine chewing gum. It can ease your withdrawal from nicotine.  Reward yourself. Set aside the cigarette money you save and buy yourself something nice.  Look for support from others. Join a support group or smoking cessation program. Ask someone at home or at work to help you with your plan to quit smoking.  Always ask yourself, "Do I need this cigarette or is this just a reflex?" Tell yourself, "Today, I choose not to smoke," or "I do not want to smoke." You are reminding yourself of your decision to quit.  Do not replace cigarette smoking with electronic cigarettes (commonly called e-cigarettes). The safety of e-cigarettes is unknown, and some may contain harmful chemicals.  If you relapse, do not give up! Plan ahead and think about what you will do the next time you get the urge to smoke. HOW WILL I FEEL WHEN I QUIT SMOKING? You may have symptoms of withdrawal because your body is used to nicotine (the addictive substance in cigarettes). You may crave cigarettes, be irritable, feel very hungry, cough often, get headaches, or have difficulty concentrating. The withdrawal symptoms are only temporary. They are strongest when you first quit but will go away within 10-14 days. When withdrawal symptoms occur, stay in control. Think about your reasons for quitting. Remind yourself that these are signs that your body is healing and getting used to being without cigarettes. Remember that withdrawal symptoms are easier to treat than the major diseases that smoking can cause.  Even after the withdrawal is over, expect periodic urges  to smoke. However, these cravings are generally short lived and will go away whether you smoke or not. Do not smoke! WHAT RESOURCES ARE AVAILABLE TO HELP ME QUIT SMOKING? Your health care provider can direct you to community resources or hospitals for support, which may include:  Group support.  Education.  Hypnosis.  Therapy.   This information is not intended to replace advice given to you by your health care provider. Make sure you discuss any questions you have with your health care provider.   Document Released: 11/23/2003 Document Revised: 03/17/2014 Document Reviewed: 08/12/2012 Elsevier Interactive Patient Education [employer]

## 2015-04-19 ENCOUNTER — Telehealth: Payer: Self-pay

## 2015-04-19 MED ORDER — DILTIAZEM HCL ER 120 MG PO CP12: 120.0000 mg | ORAL_CAPSULE | Freq: Two times a day (BID) | ORAL | Status: DC

## 2015-04-19 MED ORDER — HYDROCHLOROTHIAZIDE 12.5 MG PO TABS: 12.5000 mg | ORAL_TABLET | Freq: Every day | ORAL | Status: DC

## 2015-04-19 NOTE — Telephone Encounter (Signed)
Pt would like call back about lab results with M. Urban Gibson....   Pt states he doesn't have and rx @ 120 mg only @ 180 mg (which he was informed to discontinue   Original Order:  diltiazem (CARDIZEM CD) 120 MG 24 hr capsule [[id number]]        Pharmacy:  [facility] [address], [place] - [address]        [phone]

## 2015-04-19 NOTE — Telephone Encounter (Signed)
Discussed results with patient. He will try to pick up prescriptions today and let me know if he has any trouble. He will try to check with [facility] about whether or not they have a working phone line since I have not been able to get in touch with them.

## 2015-04-19 NOTE — Telephone Encounter (Signed)
Left message for patient. I cannot get through to his pharmacy in [place]. Regarding hist HTN and labs. The bmet was completely normal. I will send a script for HCTZ 12.5mg  daily. Patient is to continue diltiazem  BID. Follow up with me in 4 weeks.

## 2015-04-24 NOTE — Telephone Encounter (Signed)
Noted  

## 2015-05-04 ENCOUNTER — Telehealth: Payer: Self-pay

## 2015-05-04 MED ORDER — DILTIAZEM HCL ER COATED BEADS 120 MG PO CP24: 120.0000 mg | ORAL_CAPSULE | Freq: Every day | ORAL | Status: DC

## 2015-05-04 NOTE — Telephone Encounter (Signed)
PT states the way the new rx (BP pills) was written is for 12 hr ($x3 more); without ins. Pt is wondering if rx can be rewritten. PT would like cb from [place].   [phone]

## 2015-05-04 NOTE — Telephone Encounter (Signed)
States that his diltiazem was more expensive to use 120mg  12-hour capsule BID. Patient had an old supply of  24-hour capsule and he has done better with this. Reports that his BP has been 110s systolic and would like the script to be written for the fo er. Script was sent to his pharmacy.

## 2015-06-07 ENCOUNTER — Ambulatory Visit: Payer: Self-pay | Admitting: Urgent Care

## 2015-06-19 ENCOUNTER — Ambulatory Visit (INDEPENDENT_AMBULATORY_CARE_PROVIDER_SITE_OTHER): Payer: Self-pay | Admitting: Physician Assistant

## 2015-06-19 VITALS — BP 126/82 | HR 80 | Temp 98.7°F | Resp 16 | Ht 68.5 in | Wt 171.0 lb

## 2015-06-19 DIAGNOSIS — M25512 Pain in left shoulder: Secondary | ICD-10-CM

## 2015-06-19 DIAGNOSIS — S39012A Strain of muscle, fascia and tendon of lower back, initial encounter: Secondary | ICD-10-CM

## 2015-06-19 MED ORDER — TRAMADOL HCL 50 MG PO TABS: 50.0000 mg | ORAL_TABLET | Freq: Four times a day (QID) | ORAL | Status: DC | PRN

## 2015-06-19 MED ORDER — METHOCARBAMOL 500 MG PO TABS: 500.0000 mg | ORAL_TABLET | Freq: Four times a day (QID) | ORAL | Status: DC

## 2015-06-19 MED ORDER — METHOCARBAMOL 500 MG PO TABS: 500.0000 mg | ORAL_TABLET | Freq: Two times a day (BID) | ORAL | Status: DC | PRN

## 2015-06-19 NOTE — Patient Instructions (Addendum)
IF you received an x-ray today, you will receive an invoice from [facility]. Please contact [facility] at [phone] with questions or concerns regarding your invoice.   IF you received labwork today, you will receive an invoice from [employer]. Please contact [facility] at [phone] with questions or concerns regarding your invoice.   Our billing staff will not be able to assist you with questions regarding bills from these companies.  You will be contacted with the lab results as soon as they are available. The fastest way to get your results is to activate your My Chart account. Instructions are located on the last page of this paperwork. If you have not heard from [place] regarding the results in 2 weeks, please contact this office.     Please ice the shoulder three times per day.  Please heat the shoulder and back/hip prior to stretch then ice the shoulder and back/hip after stretches.  Shoulder Range of Motion Exercises Shoulder range of motion (ROM) exercises are designed to keep the shoulder moving freely. They are often recommended for people who have shoulder pain. MOVEMENT EXERCISE When you are able, do this exercise 5-6 days per week, or as told by your health care provider. Work toward doing 2 sets of 10 swings. Pendulum Exercise How To Do This Exercise Lying Down 1. Lie face-down on a bed with your abdomen close to the side of the bed. 2. Let your arm hang over the side of the bed. 3. Relax your shoulder, arm, and hand. 4. Slowly and gently swing your arm forward and back. Do not use your neck muscles to swing your arm. They should be relaxed. If you are struggling to swing your arm, have someone gently swing it for you. When you do this exercise for the first time, swing your arm at a 15 degree angle for 15 seconds, or swing your arm 10 times. As pain lessens over time, increase the angle of the swing to 30-45 degrees. 5. Repeat steps  1-4 with the other arm. How To Do This Exercise While Standing 1. Stand next to a sturdy chair or table and hold on to it with your hand.  Bend forward at the waist.  Bend your knees slightly.  Relax your other arm and let it hang limp.  Relax the shoulder blade of the arm that is hanging and let it drop.  While keeping your shoulder relaxed, use body motion to swing your arm in small circles. The first time you do this exercise, swing your arm for about 30 seconds or 10 times. When you do it next time, swing your arm for a little longer.  Stand up tall and relax.  Repeat steps 1-7, this time changing the direction of the circles. 2. Repeat steps 1-8 with the other arm. STRETCHING EXERCISES Do these exercises 3-4 times per day on 5-6 days per week or as told by your health care provider. Work toward holding the stretch for 20 seconds. Stretching Exercise 1 1. Lift your arm straight out in front of you. 2. Bend your arm 90 degrees at the elbow (right angle) so your forearm goes across your body and looks like the letter "L." 3. Use your other arm to gently pull the elbow forward and across your body. 4. Repeat steps 1-3 with the other arm. Stretching Exercise 2 You will need a towel or rope for this exercise. 1. Bend one arm behind your back with the palm facing outward. 2. Hold  a towel with your other hand. 3. Reach the arm that holds the towel above your head, and bend that arm at the elbow. Your wrist should be behind your neck. 4. Use your free hand to grab the free end of the towel. 5. With the higher hand, gently pull the towel up behind you. 6. With the lower hand, pull the towel down behind you. 7. Repeat steps 1-6 with the other arm. STRENGTHENING EXERCISES Do each of these exercises at four different times of day (sessions) every day or as told by your health care provider. To begin with, repeat each exercise 5 times (repetitions). Work toward doing 3 sets of 12  repetitions or as told by your health care provider. Strengthening Exercise 1 You will need a light weight for this activity. As you grow stronger, you may use a heavier weight. 1. Standing with a weight in your hand, lift your arm straight out to the side until it is at the same height as your shoulder. 2. Bend your arm at 90 degrees so that your fingers are pointing to the ceiling. 3. Slowly raise your hand until your arm is straight up in the air. 4. Repeat steps 1-3 with the other arm. Strengthening Exercise 2 You will need a light weight for this activity. As you grow stronger, you may use a heavier weight. 1. Standing with a weight in your hand, gradually move your straight arm in an arc, starting at your side, then out in front of you, then straight up over your head. 2. Gradually move your other arm in an arc, starting at your side, then out in front of you, then straight up over your head. 3. Repeat steps 1-2 with the other arm. Strengthening Exercise 3 You will need an elastic band for this activity. As you grow stronger, gradually increase the size of the bands or increase the number of bands that you use at one time. 1. While standing, hold an elastic band in one hand and raise that arm up in the air. 2. With your other hand, pull down the band until that hand is by your side. 3. Repeat steps 1-2 with the other arm.   This information is not intended to replace advice given to you by your health care provider. Make sure you discuss any questions you have with your health care provider.   Document Released: 11/23/2002 Document Revised: 07/11/2014 Document Reviewed: 02/20/2014 Elsevier Interactive Patient Education 2016 [employer].  Low Back Strain With Rehab A strain is an injury in which a tendon or muscle is torn. The muscles and tendons of the lower back are vulnerable to strains. However, these muscles and tendons are very strong and require a great force to be injured.  Strains are classified into three categories. Grade 1 strains cause pain, but the tendon is not lengthened. Grade 2 strains include a lengthened ligament, due to the ligament being stretched or partially ruptured. With grade 2 strains there is still function, although the function may be decreased. Grade 3 strains involve a complete tear of the tendon or muscle, and function is usually impaired. SYMPTOMS  6. Pain in the lower back. 7. Pain that affects one side more than the other. 8. Pain that gets worse with movement and may be felt in the hip, buttocks, or back of the thigh. 9. Muscle spasms of the muscles in the back. 10. Swelling along the muscles of the back. 11. Loss of strength of the back muscles. 12.  Crackling sound (crepitation) when the muscles are touched. CAUSES  Lower back strains occur when a force is placed on the muscles or tendons that is greater than they can handle. Common causes of injury include: 3. Prolonged overuse of the muscle-tendon units in the lower back, usually from incorrect posture. 4. A single violent injury or force applied to the back. RISK INCREASES WITH: 5. Sports that involve twisting forces on the spine or a lot of bending at the waist (football, rugby, weightlifting, bowling, golf, tennis, speed skating, racquetball, swimming, running, gymnastics, diving). 6. Poor strength and flexibility. 7. Failure to warm up properly before activity. 8. Family history of lower back pain or disk disorders. 9. Previous back injury or surgery (especially fusion). 10. Poor posture with lifting, especially heavy objects. 11. Prolonged sitting, especially with poor posture. PREVENTION  8. Learn and use proper posture when sitting or lifting (maintain proper posture when sitting, lift using the knees and legs, not at the waist). 9. Warm up and stretch properly before activity. 10. Allow for adequate recovery between workouts. 11. Maintain physical fitness: 1. Strength,  flexibility, and endurance. 2. Cardiovascular fitness. PROGNOSIS  If treated properly, lower back strains usually heal within 6 weeks. RELATED COMPLICATIONS  5. Recurring symptoms, resulting in a chronic problem. 6. Chronic inflammation, scarring, and partial muscle-tendon tear. 7. Delayed healing or resolution of symptoms. 8. Prolonged disability. TREATMENT  Treatment first involves the use of ice and medicine, to reduce pain and inflammation. The use of strengthening and stretching exercises may help reduce pain with activity. These exercises may be performed at home or with a therapist. Severe injuries may require referral to a therapist for further evaluation and treatment, such as ultrasound. Your caregiver may advise that you wear a back brace or corset, to help reduce pain and discomfort. Often, prolonged bed rest results in greater harm then benefit. Corticosteroid injections may be recommended. However, these should be reserved for the most serious cases. It is important to avoid using your back when lifting objects. At night, sleep on your back on a firm mattress with a pillow placed under your knees. If non-surgical treatment is unsuccessful, surgery may be needed.  MEDICATION  4. If pain medicine is needed, nonsteroidal anti-inflammatory medicines (aspirin and ibuprofen), or other minor pain relievers (acetaminophen), are often advised. 5. Do not take pain medicine for 7 days before surgery. 6. Prescription pain relievers may be given, if your caregiver thinks they are needed. Use only as directed and only as much as you need. 7. Ointments applied to the skin may be helpful. 8. Corticosteroid injections may be given by your caregiver. These injections should be reserved for the most serious cases, because they may only be given a certain number of times. HEAT AND COLD 4. Cold treatment (icing) should be applied for 10 to 15 minutes every 2 to 3 hours for inflammation and pain, and  immediately after activity that aggravates your symptoms. Use ice packs or an ice massage. 5. Heat treatment may be used before performing stretching and strengthening activities prescribed by your caregiver, physical therapist, or athletic trainer. Use a heat pack or a warm water soak. SEEK MEDICAL CARE IF:   Symptoms get worse or do not improve in 2 to 4 weeks, despite treatment.  You develop numbness, weakness, or loss of bowel or bladder function.  New, unexplained symptoms develop. (Drugs used in treatment may produce side effects.) EXERCISES  RANGE OF MOTION (ROM) AND STRETCHING EXERCISES - Low Back  Strain Most people with lower back pain will find that their symptoms get worse with excessive bending forward (flexion) or arching at the lower back (extension). The exercises which will help resolve your symptoms will focus on the opposite motion.  Your physician, physical therapist or athletic trainer will help you determine which exercises will be most helpful to resolve your lower back pain. Do not complete any exercises without first consulting with your caregiver. Discontinue any exercises which make your symptoms worse until you speak to your caregiver.  If you have pain, numbness or tingling which travels down into your buttocks, leg or foot, the goal of the therapy is for these symptoms to move closer to your back and eventually resolve. Sometimes, these leg symptoms will get better, but your lower back pain may worsen. This is typically an indication of progress in your rehabilitation. Be very alert to any changes in your symptoms and the activities in which you participated in the 24 hours prior to the change. Sharing this information with your caregiver will allow him/her to most efficiently treat your condition.  These exercises may help you when beginning to rehabilitate your injury. Your symptoms may resolve with or without further involvement from your physician, physical therapist  or athletic trainer. While completing these exercises, remember:  Restoring tissue flexibility helps normal motion to return to the joints. This allows healthier, less painful movement and activity.  An effective stretch should be held for at least 30 seconds.  A stretch should never be painful. You should only feel a gentle lengthening or release in the stretched tissue. FLEXION RANGE OF MOTION AND STRETCHING EXERCISES: STRETCH - Flexion, Single Knee to Chest   Lie on a firm bed or floor with both legs extended in front of you.  Keeping one leg in contact with the floor, bring your opposite knee to your chest. Hold your leg in place by either grabbing behind your thigh or at your knee.  Pull until you feel a gentle stretch in your lower back. Hold __________ seconds.  Slowly release your grasp and repeat the exercise with the opposite side. Repeat __________ times. Complete this exercise __________ times per day.  STRETCH - Flexion, Double Knee to Chest   Lie on a firm bed or floor with both legs extended in front of you.  Keeping one leg in contact with the floor, bring your opposite knee to your chest.  Tense your stomach muscles to support your back and then lift your other knee to your chest. Hold your legs in place by either grabbing behind your thighs or at your knees.  Pull both knees toward your chest until you feel a gentle stretch in your lower back. Hold __________ seconds.  Tense your stomach muscles and slowly return one leg at a time to the floor. Repeat __________ times. Complete this exercise __________ times per day.  STRETCH - Low Trunk Rotation  Lie on a firm bed or floor. Keeping your legs in front of you, bend your knees so they are both pointed toward the ceiling and your feet are flat on the floor.  Extend your arms out to the side. This will stabilize your upper body by keeping your shoulders in contact with the floor.  Gently and slowly drop both knees  together to one side until you feel a gentle stretch in your lower back. Hold for __________ seconds.  Tense your stomach muscles to support your lower back as you bring your knees back to  the starting position. Repeat the exercise to the other side. Repeat __________ times. Complete this exercise __________ times per day  EXTENSION RANGE OF MOTION AND FLEXIBILITY EXERCISES: STRETCH - Extension, Prone on Elbows   Lie on your stomach on the floor, a bed will be too soft. Place your palms about shoulder width apart and at the height of your head.  Place your elbows under your shoulders. If this is too painful, stack pillows under your chest.  Allow your body to relax so that your hips drop lower and make contact more completely with the floor.  Hold this position for __________ seconds.  Slowly return to lying flat on the floor. Repeat __________ times. Complete this exercise __________ times per day.  RANGE OF MOTION - Extension, Prone Press Ups  Lie on your stomach on the floor, a bed will be too soft. Place your palms about shoulder width apart and at the height of your head.  Keeping your back as relaxed as possible, slowly straighten your elbows while keeping your hips on the floor. You may adjust the placement of your hands to maximize your comfort. As you gain motion, your hands will come more underneath your shoulders.  Hold this position __________ seconds.  Slowly return to lying flat on the floor. Repeat __________ times. Complete this exercise __________ times per day.  RANGE OF MOTION- Quadruped, Neutral Spine   Assume a hands and knees position on a firm surface. Keep your hands under your shoulders and your knees under your hips. You may place padding under your knees for comfort.  Drop your head and point your tail bone toward the ground below you. This will round out your lower back like an angry cat. Hold this position for __________ seconds.  Slowly lift your head  and release your tail bone so that your back sags into a large arch, like an old horse.  Hold this position for __________ seconds.  Repeat this until you feel limber in your lower back.  Now, find your "sweet spot." This will be the most comfortable position somewhere between the two previous positions. This is your neutral spine. Once you have found this position, tense your stomach muscles to support your lower back.  Hold this position for __________ seconds. Repeat __________ times. Complete this exercise __________ times per day.  STRENGTHENING EXERCISES - Low Back Strain These exercises may help you when beginning to rehabilitate your injury. These exercises should be done near your "sweet spot." This is the neutral, low-back arch, somewhere between fully rounded and fully arched, that is your least painful position. When performed in this safe range of motion, these exercises can be used for people who have either a flexion or extension based injury. These exercises may resolve your symptoms with or without further involvement from your physician, physical therapist or athletic trainer. While completing these exercises, remember:   Muscles can gain both the endurance and the strength needed for everyday activities through controlled exercises.  Complete these exercises as instructed by your physician, physical therapist or athletic trainer. Increase the resistance and repetitions only as guided.  You may experience muscle soreness or fatigue, but the pain or discomfort you are trying to eliminate should never worsen during these exercises. If this pain does worsen, stop and make certain you are following the directions exactly. If the pain is still present after adjustments, discontinue the exercise until you can discuss the trouble with your caregiver. STRENGTHENING - Deep Abdominals, Pelvic Tilt  Lie on a firm bed or floor. Keeping your legs in front of you, bend your knees so they are  both pointed toward the ceiling and your feet are flat on the floor.  Tense your lower abdominal muscles to press your lower back into the floor. This motion will rotate your pelvis so that your tail bone is scooping upwards rather than pointing at your feet or into the floor.  With a gentle tension and even breathing, hold this position for __________ seconds. Repeat __________ times. Complete this exercise __________ times per day.  STRENGTHENING - Abdominals, Crunches   Lie on a firm bed or floor. Keeping your legs in front of you, bend your knees so they are both pointed toward the ceiling and your feet are flat on the floor. Cross your arms over your chest.  Slightly tip your chin down without bending your neck.  Tense your abdominals and slowly lift your trunk high enough to just clear your shoulder blades. Lifting higher can put excessive stress on the lower back and does not further strengthen your abdominal muscles.  Control your return to the starting position. Repeat __________ times. Complete this exercise __________ times per day.  STRENGTHENING - Quadruped, Opposite UE/LE Lift   Assume a hands and knees position on a firm surface. Keep your hands under your shoulders and your knees under your hips. You may place padding under your knees for comfort.  Find your neutral spine and gently tense your abdominal muscles so that you can maintain this position. Your shoulders and hips should form a rectangle that is parallel with the floor and is not twisted.  Keeping your trunk steady, lift your right hand no higher than your shoulder and then your left leg no higher than your hip. Make sure you are not holding your breath. Hold this position __________ seconds.  Continuing to keep your abdominal muscles tense and your back steady, slowly return to your starting position. Repeat with the opposite arm and leg. Repeat __________ times. Complete this exercise __________ times per day.   STRENGTHENING - Lower Abdominals, Double Knee Lift  Lie on a firm bed or floor. Keeping your legs in front of you, bend your knees so they are both pointed toward the ceiling and your feet are flat on the floor.  Tense your abdominal muscles to brace your lower back and slowly lift both of your knees until they come over your hips. Be certain not to hold your breath.  Hold __________ seconds. Using your abdominal muscles, return to the starting position in a slow and controlled manner. Repeat __________ times. Complete this exercise __________ times per day.  POSTURE AND BODY MECHANICS CONSIDERATIONS - Low Back Strain Keeping correct posture when sitting, standing or completing your activities will reduce the stress put on different body tissues, allowing injured tissues a chance to heal and limiting painful experiences. The following are general guidelines for improved posture. Your physician or physical therapist will provide you with any instructions specific to your needs. While reading these guidelines, remember:  The exercises prescribed by your provider will help you have the flexibility and strength to maintain correct postures.  The correct posture provides the best environment for your joints to work. All of your joints have less wear and tear when properly supported by a spine with good posture. This means you will experience a healthier, less painful body.  Correct posture must be practiced with all of your activities, especially prolonged sitting and standing. Correct  posture is as important when doing repetitive low-stress activities (typing) as it is when doing a single heavy-load activity (lifting). RESTING POSITIONS Consider which positions are most painful for you when choosing a resting position. If you have pain with flexion-based activities (sitting, bending, stooping, squatting), choose a position that allows you to rest in a less flexed posture. You would want to avoid  curling into a fetal position on your side. If your pain worsens with extension-based activities (prolonged standing, working overhead), avoid resting in an extended position such as sleeping on your stomach. Most people will find more comfort when they rest with their spine in a more neutral position, neither too rounded nor too arched. Lying on a non-sagging bed on your side with a pillow between your knees, or on your back with a pillow under your knees will often provide some relief. Keep in mind, being in any one position for a prolonged period of time, no matter how correct your posture, can still lead to stiffness. PROPER SITTING POSTURE In order to minimize stress and discomfort on your spine, you must sit with correct posture. Sitting with good posture should be effortless for a healthy body. Returning to good posture is a gradual process. Many people can work toward this most comfortably by using various supports until they have the flexibility and strength to maintain this posture on their own. When sitting with proper posture, your ears will fall over your shoulders and your shoulders will fall over your hips. You should use the back of the chair to support your upper back. Your lower back will be in a neutral position, just slightly arched. You may place a small pillow or folded towel at the base of your lower back for support.  When working at a desk, create an environment that supports good, upright posture. Without extra support, muscles tire, which leads to excessive strain on joints and other tissues. Keep these recommendations in mind: CHAIR:  A chair should be able to slide under your desk when your back makes contact with the back of the chair. This allows you to work closely.  The chair's height should allow your eyes to be level with the upper part of your monitor and your hands to be slightly lower than your elbows. BODY POSITION  Your feet should make contact with the floor. If  this is not possible, use a foot rest.  Keep your ears over your shoulders. This will reduce stress on your neck and lower back. INCORRECT SITTING POSTURES  If you are feeling tired and unable to assume a healthy sitting posture, do not slouch or slump. This puts excessive strain on your back tissues, causing more damage and pain. Healthier options include:  Using more support, like a lumbar pillow.  Switching tasks to something that requires you to be upright or walking.  Talking a brief walk.  Lying down to rest in a neutral-spine position. PROLONGED STANDING WHILE SLIGHTLY LEANING FORWARD  When completing a task that requires you to lean forward while standing in one place for a long time, place either foot up on a stationary 2-4 inch high object to help maintain the best posture. When both feet are on the ground, the lower back tends to lose its slight inward curve. If this curve flattens (or becomes too large), then the back and your other joints will experience too much stress, tire more quickly, and can cause pain. CORRECT STANDING POSTURES Proper standing posture should be assumed with all  daily activities, even if they only take a few moments, like when brushing your teeth. As in sitting, your ears should fall over your shoulders and your shoulders should fall over your hips. You should keep a slight tension in your abdominal muscles to brace your spine. Your tailbone should point down to the ground, not behind your body, resulting in an over-extended swayback posture.  INCORRECT STANDING POSTURES  Common incorrect standing postures include a forward head, locked knees and/or an excessive swayback. WALKING Walk with an upright posture. Your ears, shoulders and hips should all line-up. PROLONGED ACTIVITY IN A FLEXED POSITION When completing a task that requires you to bend forward at your waist or lean over a low surface, try to find a way to stabilize 3 out of 4 of your limbs. You can  place a hand or elbow on your thigh or rest a knee on the surface you are reaching across. This will provide you more stability so that your muscles do not fatigue as quickly. By keeping your knees relaxed, or slightly bent, you will also reduce stress across your lower back. CORRECT LIFTING TECHNIQUES DO :   Assume a wide stance. This will provide you more stability and the opportunity to get as close as possible to the object which you are lifting.  Tense your abdominals to brace your spine. Bend at the knees and hips. Keeping your back locked in a neutral-spine position, lift using your leg muscles. Lift with your legs, keeping your back straight.  Test the weight of unknown objects before attempting to lift them.  Try to keep your elbows locked down at your sides in order get the best strength from your shoulders when carrying an object.  Always ask for help when lifting heavy or awkward objects. INCORRECT LIFTING TECHNIQUES DO NOT:   Lock your knees when lifting, even if it is a small object.  Bend and twist. Pivot at your feet or move your feet when needing to change directions.  Assume that you can safely pick up even a paper clip without proper posture.   This information is not intended to replace advice given to you by your health care provider. Make sure you discuss any questions you have with your health care provider.   Document Released: 02/24/2005 Document Revised: 03/17/2014 Document Reviewed: 06/08/2008 Elsevier Interactive Patient Education [employer]2016 Elsevier Inc.
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
## 2015-06-27 ENCOUNTER — Other Ambulatory Visit: Payer: Self-pay | Admitting: Physician Assistant

## 2015-12-05 ENCOUNTER — Telehealth: Payer: Self-pay

## 2015-12-05 NOTE — Telephone Encounter (Signed)
[phone]  Medication for irregular heartbeat. - Cardio ??   [facility] [place], [place]

## 2015-12-06 ENCOUNTER — Other Ambulatory Visit: Payer: Self-pay

## 2015-12-06 MED ORDER — DILTIAZEM HCL ER COATED BEADS 120 MG PO CP24: 120.0000 mg | ORAL_CAPSULE | Freq: Every day | ORAL | 0 refills | Status: DC

## 2015-12-06 NOTE — Telephone Encounter (Signed)
Pt called requested refill on diltiazem. Needs OV.  States he is off on 10/3 and appt made. Will refill enough till OV>

## 2015-12-10 NOTE — Telephone Encounter (Signed)
Called pt to verify that the diltiazem sent on 9/28 was the correct med needed. Pt stated that is was and he has it. He has scheduled an appt for tomorrow to f/up.

## 2015-12-11 ENCOUNTER — Ambulatory Visit: Payer: Self-pay

## 2015-12-11 ENCOUNTER — Ambulatory Visit (INDEPENDENT_AMBULATORY_CARE_PROVIDER_SITE_OTHER): Payer: BLUE CROSS/BLUE SHIELD | Admitting: Urgent Care

## 2015-12-11 VITALS — BP 138/88 | HR 82 | Temp 98.2°F | Ht 68.0 in | Wt 173.0 lb

## 2015-12-11 DIAGNOSIS — Z8739 Personal history of other diseases of the musculoskeletal system and connective tissue: Secondary | ICD-10-CM

## 2015-12-11 DIAGNOSIS — Z8673 Personal history of transient ischemic attack (TIA), and cerebral infarction without residual deficits: Secondary | ICD-10-CM

## 2015-12-11 DIAGNOSIS — J449 Chronic obstructive pulmonary disease, unspecified: Secondary | ICD-10-CM | POA: Diagnosis not present

## 2015-12-11 DIAGNOSIS — I10 Essential (primary) hypertension: Secondary | ICD-10-CM

## 2015-12-11 DIAGNOSIS — I4891 Unspecified atrial fibrillation: Secondary | ICD-10-CM | POA: Diagnosis not present

## 2015-12-11 DIAGNOSIS — F17200 Nicotine dependence, unspecified, uncomplicated: Secondary | ICD-10-CM

## 2015-12-11 LAB — COMPREHENSIVE METABOLIC PANEL
ALT: 15 U/L (ref 9–46)
AST: 16 U/L (ref 10–35)
Albumin: 4 g/dL (ref 3.6–5.1)
Alkaline Phosphatase: 55 U/L (ref 40–115)
BUN: 11 mg/dL (ref 7–25)
CO2: 24 mmol/L (ref 20–31)
Calcium: 9.2 mg/dL (ref 8.6–10.3)
Chloride: 104 mmol/L (ref 98–110)
Creat: 1.2 mg/dL (ref 0.70–1.33)
Glucose, Bld: 85 mg/dL (ref 65–99)
Potassium: 4 mmol/L (ref 3.5–5.3)
Sodium: 140 mmol/L (ref 135–146)
Total Bilirubin: 0.5 mg/dL (ref 0.2–1.2)
Total Protein: 6.5 g/dL (ref 6.1–8.1)

## 2015-12-11 LAB — PROTIME-INR
INR: 1.1
Prothrombin Time: 12 s — ABNORMAL HIGH (ref 9.0–11.5)

## 2015-12-11 LAB — URIC ACID: Uric Acid, Serum: 6.3 mg/dL (ref 4.0–8.0)

## 2015-12-11 MED ORDER — DILTIAZEM HCL ER COATED BEADS 120 MG PO CP24: 120.0000 mg | ORAL_CAPSULE | Freq: Every day | ORAL | 3 refills | Status: DC

## 2015-12-11 MED ORDER — ALBUTEROL SULFATE HFA 108 (90 BASE) MCG/ACT IN AERS: 2.0000 | INHALATION_SPRAY | Freq: Four times a day (QID) | RESPIRATORY_TRACT | 2 refills | Status: DC | PRN

## 2015-12-11 NOTE — Progress Notes (Signed)
    MRN: [id number] DOB: 02-Jan-1966  Subjective:   Frank Silva is a 50 y.o. male presenting for chief complaint of Medication Refill (Proventil, Cardizem, HCTZ, Xarelto); Hypertension; and cardiac stroke (history)  Atrial fibrillation, HTN - Managed well with Xarelto, Cardizem. Reports that his last stroke was >1 year ago. He denies confusion, speech difficulty, dizziness, chest pain, shob, heart racing, palpitations. He does not get regular checks of his INR. Denies bleeding episodes.   Asthma/COPD - Has longstanding history of smoking, has intermittent wheezing. Would like a refill of his albuterol inhaler.   Overall, patient reports that he is doing well. He is in his second marriage and has worked very diligently to lose weight. Last year he reports that he was at ~250lbs. Has made significant lifestyle and dietary modifications.   Frank Silva has a current medication list which includes the following prescription(s): albuterol, diltiazem, hydrochlorothiazide, rivaroxaban, acetaminophen, methocarbamol, and tramadol. Also is allergic to peanut-containing drug products.  Frank Silva  has a past medical history of A-fib (HCC); Allergy; Atrial fibrillation (HCC); Neuromuscular disorder (HCC); and Stroke (HCC). Also  has a past surgical history that includes Cardiac catheterization.  Objective:   Vitals: BP 138/88 (BP Location: Right Arm, Patient Position: Sitting, Cuff Size: Large)   Pulse 82   Temp 98.2 F (36.8 C) (Oral)   Ht 5\' 8"  (1.727 m)   Wt 173 lb (78.5 kg)   SpO2 98%   BMI 26.30 kg/m   Physical Exam  Constitutional: He is oriented to person, place, and time. He appears well-developed and well-nourished.  HENT:  Mouth/Throat: Oropharynx is clear and moist.  Eyes: No scleral icterus.  Cardiovascular: Normal rate, regular rhythm and intact distal pulses.  Exam reveals no gallop and no friction rub.   No murmur heard. Pulmonary/Chest: No respiratory distress. He has no wheezes. He  has no rales.  Abdominal: Soft. Bowel sounds are normal. He exhibits no distension and no mass. There is no tenderness. There is no guarding.  Neurological: He is alert and oriented to person, place, and time.  Skin: Skin is warm and dry.   Assessment and Plan :   1. Atrial fibrillation, unspecified type (HCC) 2. History of stroke 3. Essential hypertension - Stable, labs pending. Refilled Cardizem. Xarelto and HCTZ should be good until 03/2016.  4. Chronic obstructive pulmonary disease, unspecified COPD type (HCC) 5. Tobacco use disorder - Refilled albuterol. - Continue efforts at smoking cessation. Counseled on nicotine replacement therapy.  6. History of gout - Labs pending  Wallis BambergMario Jatavian Calica, PA-C [facility] [phone] 12/11/2015 10:34 AM

## 2015-12-11 NOTE — Patient Instructions (Addendum)
Atrial Fibrillation Atrial fibrillation is a type of irregular or rapid heartbeat (arrhythmia). In atrial fibrillation, the heart quivers continuously in a chaotic pattern. This occurs when parts of the heart receive disorganized signals that make the heart unable to pump blood normally. This can increase the risk for stroke, heart failure, and other heart-related conditions. There are different types of atrial fibrillation, including:  Paroxysmal atrial fibrillation. This type starts suddenly, and it usually stops on its own shortly after it starts.  Persistent atrial fibrillation. This type often lasts longer than a week. It may stop on its own or with treatment.  Long-lasting persistent atrial fibrillation. This type lasts longer than 12 months.  Permanent atrial fibrillation. This type does not go away. Talk with your health care provider to learn about the type of atrial fibrillation that you have. CAUSES This condition is caused by some heart-related conditions or procedures, including:  A heart attack.  Coronary artery disease.  Heart failure.  Heart valve conditions.  High blood pressure.  Inflammation of the sac that surrounds the heart (pericarditis).  Heart surgery.  Certain heart rhythm disorders, such as Wolf-Parkinson-White syndrome. Other causes include:  Pneumonia.  Obstructive sleep apnea.  Blockage of an artery in the lungs (pulmonary embolism, or PE).  Lung cancer.  Chronic lung disease.  Thyroid problems, especially if the thyroid is overactive (hyperthyroidism).  Caffeine.  Excessive alcohol use or illegal drug use.  Use of some medicines, including certain decongestants and diet pills. Sometimes, the cause cannot be found. RISK FACTORS This condition is more likely to develop in:  People who are older in age.  People who smoke.  People who have diabetes mellitus.  People who are overweight (obese).  Athletes who exercise  vigorously. SYMPTOMS Symptoms of this condition include:  A feeling that your heart is beating rapidly or irregularly.  A feeling of discomfort or pain in your chest.  Shortness of breath.  Sudden light-headedness or weakness.  Getting tired easily during exercise. In some cases, there are no symptoms. DIAGNOSIS Your health care provider may be able to detect atrial fibrillation when taking your pulse. If detected, this condition may be diagnosed with:  An electrocardiogram (ECG).  A Holter monitor test that records your heartbeat patterns over a 24-hour period.  Transthoracic echocardiogram (TTE) to evaluate how blood flows through your heart.  Transesophageal echocardiogram (TEE) to view more detailed images of your heart.  A stress test.  Imaging tests, such as a CT scan or chest X-ray.  Blood tests. TREATMENT The main goals of treatment are to prevent blood clots from forming and to keep your heart beating at a normal rate and rhythm. The type of treatment that you receive depends on many factors, such as your underlying medical conditions and how you feel when you are experiencing atrial fibrillation. This condition may be treated with:  Medicine to slow down the heart rate, bring the heart's rhythm back to normal, or prevent clots from forming.  Electrical cardioversion. This is a procedure that resets your heart's rhythm by delivering a controlled, low-energy shock to the heart through your skin.  Different types of ablation, such as catheter ablation, catheter ablation with pacemaker, or surgical ablation. These procedures destroy the heart tissues that send abnormal signals. When the pacemaker is used, it is placed under your skin to help your heart beat in a regular rhythm. HOME CARE INSTRUCTIONS  Take over-the counter and prescription medicines only as told by your health care provider.    If your health care provider prescribed a blood-thinning medicine  (anticoagulant), take it exactly as told. Taking too much blood-thinning medicine can cause bleeding. If you do not take enough blood-thinning medicine, you will not have the protection that you need against stroke and other problems.  Do not use tobacco products, including cigarettes, chewing tobacco, and e-cigarettes. If you need help quitting, ask your health care provider.  If you have obstructive sleep apnea, manage your condition as told by your health care provider.  Do not drink alcohol.  Do not drink beverages that contain caffeine, such as coffee, soda, and tea.  Maintain a healthy weight. Do not use diet pills unless your health care provider approves. Diet pills may make heart problems worse.  Follow diet instructions as told by your health care provider.  Exercise regularly as told by your health care provider.  Keep all follow-up visits as told by your health care provider. This is important. PREVENTION  Avoid drinking beverages that contain caffeine or alcohol.  Avoid certain medicines, especially medicines that are used for breathing problems.  Avoid certain herbs and herbal medicines, such as those that contain ephedra or ginseng.  Do not use illegal drugs, such as cocaine and amphetamines.  Do not smoke.  Manage your high blood pressure. SEEK MEDICAL CARE IF:  You notice a change in the rate, rhythm, or strength of your heartbeat.  You are taking an anticoagulant and you notice increased bruising.  You tire more easily when you exercise or exert yourself. SEEK IMMEDIATE MEDICAL CARE IF:  You have chest pain, abdominal pain, sweating, or weakness.  You feel nauseous.  You notice blood in your vomit, bowel movement, or urine.  You have shortness of breath.  You suddenly have swollen feet and ankles.  You feel dizzy.  You have sudden weakness or numbness of the face, arm, or leg, especially on one side of the body.  You have trouble speaking,  trouble understanding, or both (aphasia).  Your face or your eyelid droops on one side. These symptoms may represent a serious problem that is an emergency. Do not wait to see if the symptoms will go away. Get medical help right away. Call your local emergency services (911 in the [place] Do not drive yourself to the hospital.   This information is not intended to replace advice given to you by your health care provider. Make sure you discuss any questions you have with your health care provider.   Document Released: 02/24/2005 Document Revised: 11/15/2014 Document Reviewed: 06/21/2014 Elsevier Interactive Patient Education 2016 [employer]   Smoking Cessation, Tips for Success If you are ready to quit smoking, congratulations! You have chosen to help yourself be healthier. Cigarettes bring nicotine, tar, carbon monoxide, and other irritants into your body. Your lungs, heart, and blood vessels will be able to work better without these poisons. There are many different ways to quit smoking. Nicotine gum, nicotine patches, a nicotine inhaler, or nicotine nasal spray can help with physical craving. Hypnosis, support groups, and medicines help break the habit of smoking. WHAT THINGS CAN I DO TO MAKE QUITTING EASIER?  Here are some tips to help you quit for good:  Pick a date when you will quit smoking completely. Tell all of your friends and family about your plan to quit on that date.  Do not try to slowly cut down on the number of cigarettes you are smoking. Pick a quit date and quit smoking completely starting on that day.  Throw away all cigarettes.   Clean and remove all ashtrays from your home, work, and car.  On a card, write down your reasons for quitting. Carry the card with you and read it when you get the urge to smoke.  Cleanse your body of nicotine. Drink enough water and fluids to keep your urine clear or pale yellow. Do this after quitting to flush the nicotine from your  body.  Learn to predict your moods. Do not let a bad situation be your excuse to have a cigarette. Some situations in your life might tempt you into wanting a cigarette.  Never have "just one" cigarette. It leads to wanting another and another. Remind yourself of your decision to quit.  Change habits associated with smoking. If you smoked while driving or when feeling stressed, try other activities to replace smoking. Stand up when drinking your coffee. Brush your teeth after eating. Sit in a different chair when you read the paper. Avoid alcohol while trying to quit, and try to drink fewer caffeinated beverages. Alcohol and caffeine may urge you to smoke.  Avoid foods and drinks that can trigger a desire to smoke, such as sugary or spicy foods and alcohol.  Ask people who smoke not to smoke around you.  Have something planned to do right after eating or having a cup of coffee. For example, plan to take a walk or exercise.  Try a relaxation exercise to calm you down and decrease your stress. Remember, you may be tense and nervous for the first 2 weeks after you quit, but this will pass.  Find new activities to keep your hands busy. Play with a pen, coin, or rubber band. Doodle or draw things on paper.  Brush your teeth right after eating. This will help cut down on the craving for the taste of tobacco after meals. You can also try mouthwash.   Use oral substitutes in place of cigarettes. Try using lemon drops, carrots, cinnamon sticks, or chewing gum. Keep them handy so they are available when you have the urge to smoke.  When you have the urge to smoke, try deep breathing.  Designate your home as a nonsmoking area.  If you are a heavy smoker, ask your health care provider about a prescription for nicotine chewing gum. It can ease your withdrawal from nicotine.  Reward yourself. Set aside the cigarette money you save and buy yourself something nice.  Look for support from others. Join  a support group or smoking cessation program. Ask someone at home or at work to help you with your plan to quit smoking.  Always ask yourself, "Do I need this cigarette or is this just a reflex?" Tell yourself, "Today, I choose not to smoke," or "I do not want to smoke." You are reminding yourself of your decision to quit.  Do not replace cigarette smoking with electronic cigarettes (commonly called e-cigarettes). The safety of e-cigarettes is unknown, and some may contain harmful chemicals.  If you relapse, do not give up! Plan ahead and think about what you will do the next time you get the urge to smoke. HOW WILL I FEEL WHEN I QUIT SMOKING? You may have symptoms of withdrawal because your body is used to nicotine (the addictive substance in cigarettes). You may crave cigarettes, be irritable, feel very hungry, cough often, get headaches, or have difficulty concentrating. The withdrawal symptoms are only temporary. They are strongest when you first quit but will go away within 10-14 days. When  withdrawal symptoms occur, stay in control. Think about your reasons for quitting. Remind yourself that these are signs that your body is healing and getting used to being without cigarettes. Remember that withdrawal symptoms are easier to treat than the major diseases that smoking can cause.  Even after the withdrawal is over, expect periodic urges to smoke. However, these cravings are generally short lived and will go away whether you smoke or not. Do not smoke! WHAT RESOURCES ARE AVAILABLE TO HELP ME QUIT SMOKING? Your health care provider can direct you to community resources or hospitals for support, which may include:  Group support.  Education.  Hypnosis.  Therapy.   This information is not intended to replace advice given to you by your health care provider. Make sure you discuss any questions you have with your health care provider.   Document Released: 11/23/2003 Document Revised: 03/17/2014  Document Reviewed: 08/12/2012 Elsevier Interactive Patient Education 2016 [employer].   IF you received an x-ray today, you will receive an invoice from [facility]. Please contact [facility] at [phone] with questions or concerns regarding your invoice.   IF you received labwork today, you will receive an invoice from [employer]. Please contact [facility] at [phone] with questions or concerns regarding your invoice.   Our billing staff will not be able to assist you with questions regarding bills from these companies.  You will be contacted with the lab results as soon as they are available. The fastest way to get your results is to activate your My Chart account. Instructions are located on the last page of this paperwork. If you have not heard from [place] regarding the results in 2 weeks, please contact this office.

## 2015-12-13 ENCOUNTER — Encounter: Payer: Self-pay | Admitting: Urgent Care

## 2016-02-08 ENCOUNTER — Ambulatory Visit: Payer: BLUE CROSS/BLUE SHIELD

## 2016-02-08 ENCOUNTER — Ambulatory Visit (INDEPENDENT_AMBULATORY_CARE_PROVIDER_SITE_OTHER): Payer: BLUE CROSS/BLUE SHIELD | Admitting: Physician Assistant

## 2016-02-08 VITALS — BP 138/90 | HR 75 | Temp 97.7°F | Resp 16 | Ht 68.0 in | Wt 172.0 lb

## 2016-02-08 DIAGNOSIS — M62838 Other muscle spasm: Secondary | ICD-10-CM

## 2016-02-08 DIAGNOSIS — M62830 Muscle spasm of back: Secondary | ICD-10-CM

## 2016-02-08 MED ORDER — HYDROCODONE-ACETAMINOPHEN 10-325 MG PO TABS: 1.0000 | ORAL_TABLET | Freq: Three times a day (TID) | ORAL | 0 refills | Status: DC | PRN

## 2016-02-08 MED ORDER — CYCLOBENZAPRINE HCL 10 MG PO TABS: 5.0000 mg | ORAL_TABLET | Freq: Three times a day (TID) | ORAL | 0 refills | Status: DC | PRN

## 2016-02-08 NOTE — Progress Notes (Deleted)
[facility] [address], [place] [address] [phone]  Date:  02/08/2016   Name:  Frank Silva   DOB:  [birth year]   MRN:  [id number]  PCP:  Neldon LabellaMILLER,LISA LYNN, MD    History of Present Illness:  Frank DimitriSamuel R Scheeler is a 50 y.o. male patient who [facility]   [facility] days ago with  Strained neck while he is working.  He can not bend his neck.  Radiated down his spine.  No numbness.  No dizziness.  He has taken nothing for the pain.   Loading   Patient Active Problem List   Diagnosis Date Noted  . PAF (paroxysmal atrial fibrillation) (HCC) 05/22/2014  . 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 Facial droop   . Stroke (HCC)   . Acute CVA (cerebrovascular accident) (HCC) 04/30/2014  . Accelerated hypertension 04/30/2014  . History of Dysrhythmia, cardiac 04/30/2014    Past Medical History:  Diagnosis Date  . A-fib (HCC)   . Allergy   . Atrial fibrillation (HCC)   . Neuromuscular disorder (HCC)   . Stroke [facility](HCC)     Past Surgical History:  Procedure Laterality Date  . CARDIAC CATHETERIZATION      Social History  Substance Use Topics  . Smoking status: Current Every Day Smoker    Packs/day: 1.50    Types: Cigarettes  . Smokeless tobacco: Never Used  . Alcohol use No    Family History  Problem Relation Age of Onset  . CVA Father     Allergies  Allergen Reactions  . Peanut-Containing Drug Products Hives and Shortness Of Breath    Medication list has been reviewed and updated.  Current Outpatient Prescriptions on File Prior to Visit  Medication Sig Dispense Refill  . albuterol (PROVENTIL HFA;VENTOLIN HFA) 108 (90 Base) MCG/ACT inhaler Inhale 2 puffs into the lungs every 6 (six) hours as needed for wheezing. 1 Inhaler 2  . diltiazem (DILTIAZEM CD) 120 MG 24 hr capsule Take 1 capsule (120 mg total) by mouth daily. 90 capsule 3  . hydrochlorothiazide (HYDRODIURIL) 12.5 MG tablet Take 1 tablet (12.5 mg total) by mouth daily. 90 tablet  3  . rivaroxaban (XARELTO) 20 MG TABS tablet Take 1 tablet (20 mg total) by mouth daily. 30 tablet 11   No current facility-administered medications on file prior to visit.     ROS   Physical Examination: BP 138/90   Pulse 75   Temp 97.7 F (36.5 C) (Oral)   Resp 16   Ht 5\' 8"  (1.727 m)   Wt 172 lb (78 kg)   SpO2 98%   BMI 26.15 kg/m  Ideal Body Weight: Weight in (lb) to have BMI = 25: 164.1  Physical Exam   Assessment and Plan: Frank DimitriSamuel R Hardage is a 50 y.o. male who is here today  There are no diagnoses linked to this encounter.  Trena PlattStephanie Maksym Pfiffner, PA-C [facility] 02/08/2016 3:33 PM

## 2016-02-08 NOTE — Patient Instructions (Addendum)
Mid-Back Strain Rehab Ask your health care provider which exercises are safe for you. Do exercises exactly as told by your health care provider and adjust them as directed. It is normal to feel mild stretching, pulling, tightness, or discomfort as you do these exercises, but you should stop right away if you feel sudden pain or your pain gets worse. Do not begin these exercises until told by your health care provider. Stretching and range of motion exercises This exercise warms up your muscles and joints and improves the movement and flexibility of your back and shoulders. This exercise also help to relieve pain. Exercise A: Chest and spine stretch 1. Lie down on your back on a firm surface. 2. Roll a towel or a small blanket so it is about 4 inches (10 cm) in diameter. 3. Put the towel lengthwise under the middle of your back so it is under your spine, but not under your shoulder blades. 4. To increase the stretch, you may put your hands behind your head and let your elbows fall to your sides. 5. Hold for __________ seconds. Repeat exercise __________ times. Complete this exercise __________ times a day. Strengthening exercises These exercises build strength and endurance in your back and your shoulder blade muscles. Endurance is the ability to use your muscles for a long time, even after they get tired. Exercise B: Alternating arm and leg raises 1. Get on your hands and knees on a firm surface. If you are on a hard floor, you may want to use padding to cushion your knees, such as an exercise mat. 2. Line up your arms and legs. Your hands should be below your shoulders, and your knees should be below your hips. 3. Lift your left leg behind you. At the same time, raise your right arm and straighten it in front of you.  Do not lift your leg higher than your hip.  Do not lift your arm higher than your shoulder.  Keep your abdominal and back muscles tight.  Keep your hips facing the  ground.  Do not arch your back.  Keep your balance carefully, and do not hold your breath. 4. Hold for __________ seconds. 5. Slowly return to the starting position and repeat with your right leg and your left arm. Repeat __________ times. Complete this exercise __________ times a day. Exercise C: Straight arm rows (shoulder extension) 1. Stand with your feet shoulder width apart. 2. Secure an exercise band to a stable object in front of you so the band is at or above shoulder height. 3. Hold one end of the exercise band in each hand. 4. Straighten your elbows and lift your hands up to shoulder height. 5. Step back, away from the secured end of the exercise band, until the band stretches. 6. Squeeze your shoulder blades together and pull your hands down to the sides of your thighs. Stop when your hands are straight down by your sides. Do not let your hands go behind your body. 7. Hold for __________ seconds. 8. Slowly return to the starting position. Repeat __________ times. Complete this exercise __________ times a day. Exercise D: Shoulder external rotation, prone 1. Lie on your abdomen on a firm bed so your left / right forearm hangs over the edge of the bed and your upper arm is on the bed, straight out from your body.  Your elbow should be bent.  Your palm should be facing your feet. 2. If instructed, hold a __________ weight in your hand.  3. Squeeze your shoulder blade toward the middle of your back. Do not let your shoulder lift toward your ear. 4. Keep your elbow bent in an "L" shape (90 degrees) while you slowly move your forearm up toward the ceiling. Move your forearm up to the height of the bed, toward your head.  Your upper arm should not move.  At the top of the movement, your palm should face the floor. 5. Hold for __________ seconds. 6. Slowly return to the starting position and relax your muscles. Repeat __________ times. Complete this exercise __________ times a  day. Exercise E: Scapular retraction and external rotation, rowing 1. Sit in a stable chair without armrests, or stand. 2. Secure an exercise band to a stable object in front of you so it is at shoulder height. 3. Hold one end of the exercise band in each hand. 4. Bring your arms out straight in front of you. 5. Step back, away from the secured end of the exercise band, until the band stretches. 6. Pull the band backward. As you do this, bend your elbows and squeeze your shoulder blades together, but avoid letting the rest of your body move. Do not let your shoulders lift up toward your ears. 7. Stop when your elbows are at your sides or slightly behind your body. 8. Hold for __________ seconds. 9. Slowly straighten your arms to return to the starting position. Repeat __________ times. Complete this exercise __________ times a day. Posture and body mechanics   Body mechanics refers to the movements and positions of your body while you do your daily activities. Posture is part of body mechanics. Good posture and healthy body mechanics can help to relieve stress in your body's tissues and joints. Good posture means that your spine is in its natural S-curve position (your spine is neutral), your shoulders are pulled back slightly, and your head is not tipped forward. The following are general guidelines for applying improved posture and body mechanics to your everyday activities. Standing   When standing, keep your spine neutral and your feet about hip-width apart. Keep a slight bend in your knees. Your ears, shoulders, and hips should line up.  When you do a task in which you lean forward while standing in one place for a long time, place one foot up on a stable object that is 2-4 inches (5-10 cm) high, such as a footstool. This helps keep your spine neutral. Sitting  When sitting, keep your spine neutral and keep your feet flat on the floor. Use a footrest, if necessary, and keep your thighs  parallel to the floor. Avoid rounding your shoulders, and avoid tilting your head forward.  When working at a desk or a computer, keep your desk at a height where your hands are slightly lower than your elbows. Slide your chair under your desk so you are close enough to maintain good posture.  When working at a computer, place your monitor at a height where you are looking straight ahead and you do not have to tilt your head forward or downward to look at the screen. Resting   When lying down and resting, avoid positions that are most painful for you.  If you have pain with activities such as sitting, bending, stooping, or squatting (flexion-based activities), lie in a position in which your body does not bend very much. For example, avoid curling up on your side with your arms and knees near your chest (fetal position).  If you have pain  with activities such as standing for a long time or reaching with your arms (extension-based activities), lie with your spine in a neutral position and bend your knees slightly. Try the following positions:  Lying on your side with a pillow between your knees.  Lying on your back with a pillow under your knees. Lifting   When lifting objects, keep your feet at least shoulder-width apart and tighten your abdominal muscles.  Bend your knees and hips and keep your spine neutral. It is important to lift using the strength of your legs, not your back. Do not lock your knees straight out.  Always ask for help to lift heavy or awkward objects. This information is not intended to replace advice given to you by your health care provider. Make sure you discuss any questions you have with your health care provider. Document Released: 02/24/2005 Document Revised: 11/01/2015 Document Reviewed: 12/06/2014 Elsevier Interactive Patient Education  2017 [employer].  Cervical Strain and Sprain Rehab Ask your health care provider which exercises are safe for you. Do  exercises exactly as told by your health care provider and adjust them as directed. It is normal to feel mild stretching, pulling, tightness, or discomfort as you do these exercises, but you should stop right away if you feel sudden pain or your pain gets worse.Do not begin these exercises until told by your health care provider. Stretching and range of motion exercises These exercises warm up your muscles and joints and improve the movement and flexibility of your neck. These exercises also help to relieve pain, numbness, and tingling. Exercise A: Cervical side bend 6. Using good posture, sit on a stable chair or stand up. 7. Without moving your shoulders, slowly tilt your left / right ear to your shoulder until you feel a stretch in your neck muscles. You should be looking straight ahead. 8. Hold for __________ seconds. 9. Repeat with the other side of your neck. Repeat __________ times. Complete this exercise __________ times a day. Exercise B: Cervical rotation 6. Using good posture, sit on a stable chair or stand up. 7. Slowly turn your head to the side as if you are looking over your left / right shoulder.  Keep your eyes level with the ground.  Stop when you feel a stretch along the side and the back of your neck. 8. Hold for __________ seconds. 9. Repeat this by turning to your other side. Repeat __________ times. Complete this exercise __________ times a day. Exercise C: Thoracic extension and pectoral stretch 9. Roll a towel or a small blanket so it is about 4 inches (10 cm) in diameter. 10. Lie down on your back on a firm surface. 11. Put the towel lengthwise, under your spine in the middle of your back. It should not be not under your shoulder blades. The towel should line up with your spine from your middle back to your lower back. 12. Put your hands behind your head and let your elbows fall out to your sides. 13. Hold for __________ seconds. Repeat __________ times. Complete  this exercise __________ times a day. Strengthening exercises These exercises build strength and endurance in your neck. Endurance is the ability to use your muscles for a long time, even after your muscles get tired. Exercise D: Upper cervical flexion, isometric 1. Lie on your back with a thin pillow behind your head and a small rolled-up towel under your neck. 2. Gently tuck your chin toward your chest and nod your head down to  look toward your feet. Do not lift your head off the pillow. 3. Hold for __________ seconds. 4. Release the tension slowly. Relax your neck muscles completely before you repeat this exercise. Repeat __________ times. Complete this exercise __________ times a day. Exercise E: Cervical extension, isometric 10. Stand about 6 inches (15 cm) away from a wall, with your back facing the wall. 11. Place a soft object, about 6-8 inches (15-20 cm) in diameter, between the back of your head and the wall. A soft object could be a small pillow, a ball, or a folded towel. 12. Gently tilt your head back and press into the soft object. Keep your jaw and forehead relaxed. 13. Hold for __________ seconds. 14. Release the tension slowly. Relax your neck muscles completely before you repeat this exercise. Repeat __________ times. Complete this exercise __________ times a day. Posture and body mechanics   Body mechanics refers to the movements and positions of your body while you do your daily activities. Posture is part of body mechanics. Good posture and healthy body mechanics can help to relieve stress in your body's tissues and joints. Good posture means that your spine is in its natural S-curve position (your spine is neutral), your shoulders are pulled back slightly, and your head is not tipped forward. The following are general guidelines for applying improved posture and body mechanics to your everyday activities. Standing  When standing, keep your spine neutral and keep your feet  about hip-width apart. Keep a slight bend in your knees. Your ears, shoulders, and hips should line up.  When you do a task in which you stand in one place for a long time, place one foot up on a stable object that is 2-4 inches (5-10 cm) high, such as a footstool. This helps keep your spine neutral. Sitting  When sitting, keep your spine neutral and your keep feet flat on the floor. Use a footrest, if necessary, and keep your thighs parallel to the floor. Avoid rounding your shoulders, and avoid tilting your head forward.  When working at a desk or a computer, keep your desk at a height where your hands are slightly lower than your elbows. Slide your chair under your desk so you are close enough to maintain good posture.  When working at a computer, place your monitor at a height where you are looking straight ahead and you do not have to tilt your head forward or downward to look at the screen. Resting When lying down and resting, avoid positions that are most painful for you. Try to support your neck in a neutral position. You can use a contour pillow or a small rolled-up towel. Your pillow should support your neck but not push on it. This information is not intended to replace advice given to you by your health care provider. Make sure you discuss any questions you have with your health care provider. Document Released: 02/24/2005 Document Revised: 11/01/2015 Document Reviewed: 01/31/2015 Elsevier Interactive Patient Education  2017 [employer]     IF you received an x-ray today, you will receive an invoice from [facility]. Please contact [facility] at [phone] with questions or concerns regarding your invoice.   IF you received labwork today, you will receive an invoice from [facility]. Please contact [facility] at [phone] with questions or concerns regarding your invoice.   Our billing staff will not be able to assist you with  questions regarding bills from these companies.  You will be contacted  with the lab results as soon as they are available. The fastest way to get your results is to activate your My Chart account. Instructions are located on the last page of this paperwork. If you have not heard from us regarding the results in 2 weeks, please contact this office.      

## 2016-03-08 NOTE — Progress Notes (Signed)
[facility] [address], [place] [address] [phone]  Date:  02/08/2016   Name:  Frank Silva                    DOB:  Jul 12, 1965                     MRN:  [id number]  PCP:  Neldon LabellaMILLER,LISA LYNN, MD    History of Present Illness:  Frank Silva is a 50 y.o. male patient who [facility] 5 days ago with neck pain while he is working.  He can not bend his neck.  Radiated down his spine.  No numbness.  No dizziness.  He has taken nothing for the pain.  Strenuous manual labor.  Hx of back pain and degenerative changes.       Patient Active Problem List   Diagnosis Date Noted  . PAF (paroxysmal atrial fibrillation) (HCC) 05/22/2014  . 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 Facial droop   . Stroke (HCC)   . Acute CVA (cerebrovascular accident) (HCC) 04/30/2014  . Accelerated hypertension 04/30/2014  . History of Dysrhythmia, cardiac 04/30/2014        Past Medical History:  Diagnosis Date  . A-fib (HCC)   . Allergy   . Atrial fibrillation (HCC)   . Neuromuscular disorder (HCC)   . Stroke [facility](HCC)          Past Surgical History:  Procedure Laterality Date  . CARDIAC CATHETERIZATION           Social History  Substance Use Topics  . Smoking status: Current Every Day Smoker    Packs/day: 1.50    Types: Cigarettes  . Smokeless tobacco: Never Used  . Alcohol use No         Family History  Problem Relation Age of Onset  . CVA Father         Allergies  Allergen Reactions  . Peanut-Containing Drug Products Hives and Shortness Of Breath    Medication list has been reviewed and updated.  Current Outpatient Prescriptions on File Prior to Visit  Medication Sig Dispense Refill  . albuterol (PROVENTIL HFA;VENTOLIN HFA) 108 (90 Base) MCG/ACT inhaler Inhale 2 puffs into the lungs every 6 (six) hours as needed for wheezing. 1 Inhaler 2  . diltiazem (DILTIAZEM CD) 120 MG 24 hr capsule  Take 1 capsule (120 mg total) by mouth daily. 90 capsule 3  . hydrochlorothiazide (HYDRODIURIL) 12.5 MG tablet Take 1 tablet (12.5 mg total) by mouth daily. 90 tablet 3  . rivaroxaban (XARELTO) 20 MG TABS tablet Take 1 tablet (20 mg total) by mouth daily. 30 tablet 11   No current facility-administered medications on file prior to visit.     ROS ROS otherwise unremarkable unless listed above  Physical Examination: BP 138/90   Pulse 75   Temp 97.7 F (36.5 C) (Oral)   Resp 16   Ht 5\' 8"  (1.727 m)   Wt 172 lb (78 kg)   SpO2 98%   BMI 26.15 kg/m  Ideal Body Weight: Weight in (lb) to have BMI = 25: 164.1  Physical Exam  Constitutional: He is oriented to person, place, and time. He appears well-developed and well-nourished. No distress.  HENT:  Head: Normocephalic and atraumatic.  Right Ear: External ear normal.  Eyes: Conjunctivae and EOM are normal. Pupils are equal, round, and reactive to light.  Neck: No spinous process tenderness  present.  Cervical musculature tender to distal trapezius.  Limited rom at this time.    Cardiovascular: Normal rate.   Pulmonary/Chest: Effort normal. No respiratory distress.  Neurological: He is alert and oriented to person, place, and time.  Skin: Skin is warm and dry. He is not diaphoretic.  Psychiatric: He has a normal mood and affect. His behavior is normal.     Assessment and Plan: Frank Silva is a 50 y.o. male who is here today for cc of back pain at work. Return as needed. Spasm of thoracic back muscle - Plan: cyclobenzaprine (FLEXERIL) 10 MG tablet, HYDROcodone-acetaminophen (NORCO) 10-325 MG tablet  Cervical paraspinal muscle spasm - Plan: cyclobenzaprine (FLEXERIL) 10 MG tablet, HYDROcodone-acetaminophen (NORCO) 10-325 MG tablet  Trena PlattStephanie English, PA-C [facility] 02/08/2016 3:33 PM

## 2016-04-22 ENCOUNTER — Ambulatory Visit (INDEPENDENT_AMBULATORY_CARE_PROVIDER_SITE_OTHER): Payer: BLUE CROSS/BLUE SHIELD | Admitting: Physician Assistant

## 2016-04-22 ENCOUNTER — Encounter: Payer: Self-pay | Admitting: Physician Assistant

## 2016-04-22 VITALS — BP 140/88 | HR 79 | Temp 98.3°F | Ht 68.0 in | Wt 168.8 lb

## 2016-04-22 DIAGNOSIS — I10 Essential (primary) hypertension: Secondary | ICD-10-CM | POA: Diagnosis not present

## 2016-04-22 DIAGNOSIS — Z23 Encounter for immunization: Secondary | ICD-10-CM | POA: Diagnosis not present

## 2016-04-22 DIAGNOSIS — I480 Paroxysmal atrial fibrillation: Secondary | ICD-10-CM | POA: Diagnosis not present

## 2016-04-22 DIAGNOSIS — Z79899 Other long term (current) drug therapy: Secondary | ICD-10-CM | POA: Diagnosis not present

## 2016-04-22 MED ORDER — HYDROCHLOROTHIAZIDE 12.5 MG PO TABS: 12.5000 mg | ORAL_TABLET | Freq: Every day | ORAL | 3 refills | Status: DC

## 2016-04-22 MED ORDER — RIVAROXABAN 20 MG PO TABS: 20.0000 mg | ORAL_TABLET | Freq: Every day | ORAL | 11 refills | Status: DC

## 2016-04-22 NOTE — Progress Notes (Signed)
[facility] [address] [place] [address] [phone]  Date:  04/22/2016   Name:  Frank Silva   DOB:  March 01, 1966   MRN:  [id number]  PCP:  Neldon Labella, MD    History of Present Illness:  Frank Silva is a 51 y.o. male patient who presents to [facility] for medication refill.  Pt has no concerns or complaints at this time. He is compliant on his medication.   HTN/Stroke: No recent cp, palpitations, sob, leg swelling, or palpitations.  He is compliant on the HCTZ and diltiazem.  No numbness or tingling.  No extremity weakness.    Smoking: continues to smoke.  Achieved several years of cessation, but family stressors bout, started him again.  He quit cold [place] prior.    Patient Active Problem List   Diagnosis Date Noted  . PAF (paroxysmal atrial fibrillation) (HCC) 05/22/2014  . 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 Facial droop   . Stroke (HCC)   . Acute CVA (cerebrovascular accident) (HCC) 04/30/2014  . Accelerated hypertension 04/30/2014  . History of Dysrhythmia, cardiac 04/30/2014    Past Medical History:  Diagnosis Date  . A-fib (HCC)   . Allergy   . Atrial fibrillation (HCC)   . Neuromuscular disorder (HCC)   . Stroke [facility])     Past Surgical History:  Procedure Laterality Date  . CARDIAC CATHETERIZATION      Social History  Substance Use Topics  . Smoking status: Current Every Day Smoker    Packs/day: 1.50    Types: Cigarettes  . Smokeless tobacco: Never Used  . Alcohol use No    Family History  Problem Relation Age of Onset  . CVA Father     Allergies  Allergen Reactions  . Peanut-Containing Drug Products Hives and Shortness Of Breath    Medication list has been reviewed and updated.  Current Outpatient Prescriptions on File Prior to Visit  Medication Sig Dispense Refill  . albuterol (PROVENTIL HFA;VENTOLIN HFA) 108 (90 Base) MCG/ACT inhaler Inhale 2 puffs into the lungs every 6 (six) hours as needed  for wheezing. 1 Inhaler 2  . cyclobenzaprine (FLEXERIL) 10 MG tablet Take 0.5-1 tablets (5-10 mg total) by mouth 3 (three) times daily as needed for muscle spasms. 30 tablet 0  . diltiazem (DILTIAZEM CD) 120 MG 24 hr capsule Take 1 capsule (120 mg total) by mouth daily. 90 capsule 3  . hydrochlorothiazide (HYDRODIURIL) 12.5 MG tablet Take 1 tablet (12.5 mg total) by mouth daily. 90 tablet 3  . HYDROcodone-acetaminophen (NORCO) 10-325 MG tablet Take 1 tablet by mouth every 8 (eight) hours as needed. 9 tablet 0  . rivaroxaban (XARELTO) 20 MG TABS tablet Take 1 tablet (20 mg total) by mouth daily. 30 tablet 11   No current facility-administered medications on file prior to visit.     ROS ROS otherwise unremarkable unless listed above.   Physical Examination: There were no vitals taken for this visit. Ideal Body Weight:    Physical Exam  Constitutional: He is oriented to person, place, and time. He appears well-developed and well-nourished. No distress.  HENT:  Head: Normocephalic and atraumatic.  Eyes: Conjunctivae and EOM are normal. Pupils are equal, round, and reactive to light.  Cardiovascular: Normal rate and regular rhythm.  Exam reveals no gallop and no friction rub.   No murmur heard. Pulses:      Carotid pulses are 2+ on the right side, and 2+ on the left side.  Radial pulses are 2+ on the right side, and 2+ on the left side.       Dorsalis pedis pulses are 2+ on the right side, and 2+ on the left side.  Pulmonary/Chest: Effort normal. No respiratory distress.  Neurological: He is alert and oriented to person, place, and time.  Skin: Skin is warm and dry. He is not diaphoretic.  Psychiatric: He has a normal mood and affect. His behavior is normal.     Assessment and Plan: Frank DimitriSamuel R Silva is a 51 y.o. male who is here today for medication refill. Refill for 6 months.    Essential hypertension - Plan: hydrochlorothiazide (HYDRODIURIL) 12.5 MG tablet  Medication  management - Plan: CBC with Differential/Platelet  Paroxysmal atrial fibrillation (HCC) - Plan: rivaroxaban (XARELTO) 20 MG TABS tablet  Need for prophylactic vaccination and inoculation against influenza - Plan: Flu Vaccine QUAD 36+ mos IM  Trena PlattStephanie English, PA-C [facility] 2/21/20187:53 AM

## 2016-04-22 NOTE — Progress Notes (Deleted)
Cerebral infarction:   xarelto    Tobacco use 1 pk per day.  Albuterol   1. Atrial fibrillation, unspecified type (HCC) 2. History of stroke 3. Essential hypertension - Stable, labs pending. Refilled Cardizem. Xarelto and HCTZ should be good until 03/2016.  4. Chronic obstructive pulmonary disease, unspecified COPD type (HCC) 5. Tobacco use disorder - Refilled albuterol. - Continue efforts at smoking cessation. Counseled on nicotine replacement therapy.  6. History of gout  BP 140/88 (BP Location: Right Arm, Patient Position: Sitting, Cuff Size: Small)   Pulse 79   Temp 98.3 F (36.8 C) (Oral)   Ht 5\' 8"  (1.727 m)   Wt 168 lb 12.8 oz (76.6 kg)   SpO2 99%   BMI 25.67 kg/m   Check bp 128/82.   Tries to eat better, avoiding junk food.    Wt Readings from Last 3 Encounters:  04/22/16 168 lb 12.8 oz (76.6 kg)  02/08/16 172 lb (78 kg)  12/11/15 173 lb (78.5 kg)

## 2016-04-22 NOTE — Patient Instructions (Addendum)
I will have your lab results within 2 weeks.       IF you received an x-ray today, you will receive an invoice from [facility]. Please contact [facility] at [phone] with questions or concerns regarding your invoice.   IF you received labwork today, you will receive an invoice from [facility]. Please contact [facility] at [phone] with questions or concerns regarding your invoice.   Our billing staff will not be able to assist you with questions regarding bills from these companies.  You will be contacted with the lab results as soon as they are available. The fastest way to get your results is to activate your My Chart account. Instructions are located on the last page of this paperwork. If you have not heard from us regarding the results in 2 weeks, please contact this office.      

## 2016-04-23 LAB — CBC WITH DIFFERENTIAL/PLATELET
Basophils Absolute: 0.1 10*3/uL (ref 0.0–0.2)
Basos: 1 %
EOS (ABSOLUTE): 0.2 10*3/uL (ref 0.0–0.4)
Eos: 3 %
Hematocrit: 50.2 % (ref 37.5–51.0)
Hemoglobin: 17.3 g/dL (ref 13.0–17.7)
Immature Grans (Abs): 0 10*3/uL (ref 0.0–0.1)
Immature Granulocytes: 0 %
Lymphocytes Absolute: 2.2 10*3/uL (ref 0.7–3.1)
Lymphs: 31 %
MCH: 32.5 pg (ref 26.6–33.0)
MCHC: 34.5 g/dL (ref 31.5–35.7)
MCV: 94 fL (ref 79–97)
Monocytes Absolute: 0.6 10*3/uL (ref 0.1–0.9)
Monocytes: 8 %
Neutrophils Absolute: 4 10*3/uL (ref 1.4–7.0)
Neutrophils: 57 %
Platelets: 205 10*3/uL (ref 150–379)
RBC: 5.33 x10E6/uL (ref 4.14–5.80)
RDW: 13.4 % (ref 12.3–15.4)
WBC: 7 10*3/uL (ref 3.4–10.8)

## 2016-05-20 ENCOUNTER — Ambulatory Visit (HOSPITAL_COMMUNITY)
Admission: EM | Admit: 2016-05-20 | Discharge: 2016-05-20 | Disposition: A | Discharge status: Home / Self Care | Payer: BLUE CROSS/BLUE SHIELD | Attending: Internal Medicine | Admitting: Internal Medicine

## 2016-05-20 ENCOUNTER — Encounter (HOSPITAL_COMMUNITY): Payer: Self-pay | Admitting: Emergency Medicine

## 2016-05-20 DIAGNOSIS — J9801 Acute bronchospasm: Secondary | ICD-10-CM

## 2016-05-20 DIAGNOSIS — R5383 Other fatigue: Secondary | ICD-10-CM

## 2016-05-20 DIAGNOSIS — R0602 Shortness of breath: Secondary | ICD-10-CM | POA: Diagnosis not present

## 2016-05-20 MED ORDER — IPRATROPIUM-ALBUTEROL 0.5-2.5 (3) MG/3ML IN SOLN
RESPIRATORY_TRACT | Status: AC
Start: 2016-05-20 — End: 2016-05-20
  Filled 2016-05-20: qty 3

## 2016-05-20 MED ORDER — PREDNISONE 20 MG PO TABS
60.0000 mg | ORAL_TABLET | Freq: Once | ORAL | Status: AC
  Administered 2016-05-20: 60 mg via ORAL

## 2016-05-20 MED ORDER — ALBUTEROL SULFATE (2.5 MG/3ML) 0.083% IN NEBU
INHALATION_SOLUTION | RESPIRATORY_TRACT | Status: AC
  Filled 2016-05-20: qty 3

## 2016-05-20 MED ORDER — IPRATROPIUM-ALBUTEROL 0.5-2.5 (3) MG/3ML IN SOLN
3.0000 mL | Freq: Once | RESPIRATORY_TRACT | Status: AC
  Administered 2016-05-20: 3 mL via RESPIRATORY_TRACT

## 2016-05-20 MED ORDER — ALBUTEROL SULFATE (2.5 MG/3ML) 0.083% IN NEBU
2.5000 mg | INHALATION_SOLUTION | Freq: Once | RESPIRATORY_TRACT | Status: AC
  Administered 2016-05-20: 2.5 mg via RESPIRATORY_TRACT

## 2016-05-20 MED ORDER — PREDNISONE 50 MG PO TABS: ORAL_TABLET | ORAL | 0 refills | Status: DC

## 2016-05-20 MED ORDER — PREDNISONE 20 MG PO TABS
ORAL_TABLET | ORAL | Status: AC
  Filled 2016-05-20: qty 3

## 2016-05-20 NOTE — ED Provider Notes (Signed)
CSN: [id number]     Arrival date & time 05/20/16  1259 History   First MD Initiated Contact with Patient 05/20/16 1346     Chief Complaint  Patient presents with  . Cough  . Wheezing   (Consider location/radiation/quality/duration/timing/severity/associated sxs/prior Treatment) Asked to see patient on arrival due to problems with breathing. The patient states that he is short of breath and wheezing. He is a daily smoker with a history of atrial fibrillation and CVA and a neuromuscular disorder. Prednisone and a DuoNeb is ordered. He states he has not smoked in 10 days that he has a very strong odor of cigarette smoke. Patient states about 4-5 days ago he started with a cough. The following day he had some tightness in his chest and then the next day he was feeling weak and tired and having more problems with breathing and wheezing. He has an albuterol inhaler that has only been using it twice a day. He felt a few days ago he may of had a fever but it was not measured and he is not sure. Today he is afebrile.  Patient also notes that wheezing and respiratory difficulty seems to exacerbate during the springtime as he also has allergies.      Past Medical History:  Diagnosis Date  . A-fib (HCC)   . Allergy   . Atrial fibrillation (HCC)   . Neuromuscular disorder (HCC)   . Stroke [facility])    Past Surgical History:  Procedure Laterality Date  . CARDIAC CATHETERIZATION     Family History  Problem Relation Age of Onset  . CVA Father    Social History  Substance Use Topics  . Smoking status: Current Every Day Smoker    Packs/day: 1.50    Types: Cigarettes  . Smokeless tobacco: Never Used  . Alcohol use No    Review of Systems  Constitutional: Positive for activity change, fatigue and fever.  HENT: Positive for congestion and voice change.   Respiratory: Positive for cough, shortness of breath and wheezing.   Cardiovascular: Positive for chest pain.       Various areas of chest  pain particular he when coughing and sometimes with moving.  Gastrointestinal: Negative.   Musculoskeletal: Positive for myalgias.  Skin: Negative.   Neurological: Negative.   All other systems reviewed and are negative.   Allergies  Peanut-containing drug products  Home Medications   Prior to Admission medications   Medication Sig Start Date End Date Taking? Authorizing Provider  albuterol (PROVENTIL HFA;VENTOLIN HFA) 108 (90 Base) MCG/ACT inhaler Inhale 2 puffs into the lungs every 6 (six) hours as needed for wheezing. 12/11/15  Yes Wallis Bamberg, PA-C  diltiazem (DILTIAZEM CD) 120 MG 24 hr capsule Take 1 capsule (120 mg total) by mouth daily. 12/11/15  Yes Wallis Bamberg, PA-C  hydrochlorothiazide (HYDRODIURIL) 12.5 MG tablet Take 1 tablet (12.5 mg total) by mouth daily. 04/22/16  Yes Collie Siad English, PA  rivaroxaban (XARELTO) 20 MG TABS tablet Take 1 tablet (20 mg total) by mouth daily. 04/22/16  Yes Stephanie D English, PA  predniSONE (DELTASONE) 50 MG tablet 1 tab po daily for 6 days. Take with food. 05/20/16   Hayden Rasmussen, NP   Meds Ordered and Administered this Visit   Medications  predniSONE (DELTASONE) tablet 60 mg (60 mg Oral Given 05/20/16 1350)  ipratropium-albuterol (DUONEB) 0.5-2.5 (3) MG/3ML nebulizer solution 3 mL (3 mLs Nebulization Given 05/20/16 1349)  albuterol (PROVENTIL) (2.5 MG/3ML) 0.083% nebulizer solution 2.5 mg (2.5 mg Nebulization  Given 05/20/16 1537)    BP 143/71 (BP Location: Left Arm)   Pulse 88   Temp 98.3 F (36.8 C) (Oral)   Resp 16   SpO2 95%  No data found.   Physical Exam  Constitutional: He is oriented to person, place, and time. He appears well-developed and well-nourished.  Mild to moderate respiratory distress.  Eyes: EOM are normal.  Neck: Neck supple.  Cardiovascular: Normal rate, regular rhythm and normal heart sounds.   Pulmonary/Chest: He has wheezes.  Initially bilateral diffuse inspiratory and expiratory wheezes with fair to poor air  movement.  After the initial DuoNeb he states he feels like he is breathing a little bit better. Air movement has improved and wheezing has decreased. He continues to have significant wheezing however and needs additional treatment.  Musculoskeletal: He exhibits no edema.  Neurological: He is alert and oriented to person, place, and time.  Skin: Skin is warm and dry.  Psychiatric: He has a normal mood and affect.  Nursing note and vitals reviewed.   Urgent Care Course     Procedures (including critical care time)  Labs Review Labs Reviewed - No data to display  Imaging Review No results found.   Visual Acuity Review  Right Eye Distance:   Left Eye Distance:   Bilateral Distance:    Right Eye Near:   Left Eye Near:    Bilateral Near:         MDM   1. Bronchospasm   2. Shortness of breath   3. Other fatigue    Use the albuterol inhaler 2 puffs every 4 hours as needed. Take the prednisone once a day with food as directed. Try to stop smoking as soon as possible. For fevers or worsening and not getting any better with current treatment seek medical treatment prompted. After the second albuterol nebulizer the patient states he is feeling much better. He still feels like he has wheezes but they are decreased and he has much improved air movement. He is able to stand and take deep breaths and he is satisfied with that. He states he feels like he can go home. Meds ordered this encounter  Medications  . predniSONE (DELTASONE) tablet 60 mg  . ipratropium-albuterol (DUONEB) 0.5-2.5 (3) MG/3ML nebulizer solution 3 mL  . albuterol (PROVENTIL) (2.5 MG/3ML) 0.083% nebulizer solution 2.5 mg  . predniSONE (DELTASONE) 50 MG tablet    Sig: 1 tab po daily for 6 days. Take with food.    Dispense:  6 tablet    Refill:  0    Order Specific Question:   Supervising Provider    Answer:   Eustace MooreMURRAY, LAURA W [[id number]]       Hayden Rasmussenavid Dong Nimmons, NP 05/20/16 1642

## 2016-05-20 NOTE — ED Triage Notes (Signed)
[facility] a [facility] cough, SOB and wheezing x 3 days that has gotten worse today. The patient had audible wheezing and distress.

## 2016-05-20 NOTE — Discharge Instructions (Signed)
Use the albuterol inhaler 2 puffs every 4 hours as needed. Take the prednisone once a day with food as directed. Try to stop smoking as soon as possible. For fevers or worsening and not getting any better with current treatment seek medical treatment prompted.

## 2016-05-20 NOTE — ED Notes (Signed)
Patient acuity reported to T. Jaci LazierBast, Charity fundraiserN.

## 2016-05-26 ENCOUNTER — Ambulatory Visit (INDEPENDENT_AMBULATORY_CARE_PROVIDER_SITE_OTHER): Payer: BLUE CROSS/BLUE SHIELD

## 2016-05-26 ENCOUNTER — Ambulatory Visit (INDEPENDENT_AMBULATORY_CARE_PROVIDER_SITE_OTHER): Payer: BLUE CROSS/BLUE SHIELD | Admitting: Urgent Care

## 2016-05-26 VITALS — BP 147/85 | HR 81 | Temp 97.9°F | Resp 18 | Ht 68.0 in | Wt 164.0 lb

## 2016-05-26 DIAGNOSIS — F17200 Nicotine dependence, unspecified, uncomplicated: Secondary | ICD-10-CM | POA: Diagnosis not present

## 2016-05-26 DIAGNOSIS — R05 Cough: Secondary | ICD-10-CM

## 2016-05-26 DIAGNOSIS — R0789 Other chest pain: Secondary | ICD-10-CM

## 2016-05-26 DIAGNOSIS — R0602 Shortness of breath: Secondary | ICD-10-CM

## 2016-05-26 DIAGNOSIS — J209 Acute bronchitis, unspecified: Secondary | ICD-10-CM | POA: Diagnosis not present

## 2016-05-26 DIAGNOSIS — R059 Cough, unspecified: Secondary | ICD-10-CM

## 2016-05-26 MED ORDER — BENZONATATE 100 MG PO CAPS
100.0000 mg | ORAL_CAPSULE | Freq: Three times a day (TID) | ORAL | 0 refills | Status: DC | PRN
Start: 2016-05-26 — End: 2016-11-14

## 2016-05-26 MED ORDER — HYDROCOD POLST-CPM POLST ER 10-8 MG/5ML PO SUER: 5.0000 mL | Freq: Every evening | ORAL | 0 refills | Status: DC | PRN

## 2016-05-26 MED ORDER — AZITHROMYCIN 250 MG PO TABS: ORAL_TABLET | ORAL | 0 refills | Status: DC

## 2016-05-26 NOTE — Patient Instructions (Addendum)
Please schedule your albuterol inhaler every 4-6 hours for shortness of breath and wheezing.   Acute Bronchitis, Adult Acute bronchitis is sudden (acute) swelling of the air tubes (bronchi) in the lungs. Acute bronchitis causes these tubes to fill with mucus, which can make it hard to breathe. It can also cause coughing or wheezing. In adults, acute bronchitis usually goes away within 2 weeks. A cough caused by bronchitis may last up to 3 weeks. Smoking, allergies, and asthma can make the condition worse. Repeated episodes of bronchitis may cause further lung problems, such as chronic obstructive pulmonary disease (COPD). What are the causes? This condition can be caused by germs and by substances that irritate the lungs, including:  Cold and flu viruses. This condition is most often caused by the same virus that causes a cold.  Bacteria.  Exposure to tobacco smoke, dust, fumes, and air pollution. What increases the risk? This condition is more likely to develop in people who:  Have close contact with someone with acute bronchitis.  Are exposed to lung irritants, such as tobacco smoke, dust, fumes, and vapors.  Have a weak immune system.  Have a respiratory condition such as asthma. What are the signs or symptoms? Symptoms of this condition include:  A cough.  Coughing up clear, yellow, or green mucus.  Wheezing.  Chest congestion.  Shortness of breath.  A fever.  Body aches.  Chills.  A sore throat. How is this diagnosed? This condition is usually diagnosed with a physical exam. During the exam, your health care provider may order tests, such as chest X-rays, to rule out other conditions. He or she may also:  Test a sample of your mucus for bacterial infection.  Check the level of oxygen in your blood. This is done to check for pneumonia.  Do a chest X-ray or lung function testing to rule out pneumonia and other conditions.  Perform blood tests. Your health care  provider will also ask about your symptoms and medical history. How is this treated? Most cases of acute bronchitis clear up over time without treatment. Your health care provider may recommend:  Drinking more fluids. Drinking more makes your mucus thinner, which may make it easier to breathe.  Taking a medicine for a fever or cough.  Taking an antibiotic medicine.  Using an inhaler to help improve shortness of breath and to control a cough.  Using a cool mist vaporizer or humidifier to make it easier to breathe. Follow these instructions at home: Medicines   Take over-the-counter and prescription medicines only as told by your health care provider.  If you were prescribed an antibiotic, take it as told by your health care provider. Do not stop taking the antibiotic even if you start to feel better. General instructions   Get plenty of rest.  Drink enough fluids to keep your urine clear or pale yellow.  Avoid smoking and secondhand smoke. Exposure to cigarette smoke or irritating chemicals will make bronchitis worse. If you smoke and you need help quitting, ask your health care provider. Quitting smoking will help your lungs heal faster.  Use an inhaler, cool mist vaporizer, or humidifier as told by your health care provider.  Keep all follow-up visits as told by your health care provider. This is important. How is this prevented? To lower your risk of getting this condition again:  Wash your hands often with soap and water. If soap and water are not available, use hand sanitizer.  Avoid contact with people  who have cold symptoms.  Try not to touch your hands to your mouth, nose, or eyes.  Make sure to get the flu shot every year. Contact a health care provider if:  Your symptoms do not improve in 2 weeks of treatment. Get help right away if:  You cough up blood.  You have chest pain.  You have severe shortness of breath.  You become dehydrated.  You faint or keep  feeling like you are going to faint.  You keep vomiting.  You have a severe headache.  Your fever or chills gets worse. This information is not intended to replace advice given to you by your health care provider. Make sure you discuss any questions you have with your health care provider. Document Released: 04/03/2004 Document Revised: 09/19/2015 Document Reviewed: 08/15/2015 Elsevier Interactive Patient Education  2017 Elsevier Inc.    Health Risks of Smoking Smoking cigarettes is very bad for your health. Tobacco smoke has over 200 known poisons in it. It contains the poisonous gases nitrogen oxide and carbon monoxide. There are over 60 chemicals in tobacco smoke that cause cancer. Smoking is difficult to quit because a chemical in tobacco, called nicotine, causes addiction or dependence. When you smoke and inhale, nicotine is absorbed rapidly into the bloodstream through your lungs. Both inhaled and non-inhaled nicotine may be addictive. What are the risks of cigarette smoke? Cigarette smokers have an increased risk of many serious medical problems, including:  Lung cancer.  Lung disease, such as pneumonia, bronchitis, and emphysema.  Chest pain (angina) and heart attack because the heart is not getting enough oxygen.  Heart disease and peripheral blood vessel disease.  High blood pressure (hypertension).  Stroke.  Oral cancer, including cancer of the lip, mouth, or voice box.  Bladder cancer.  Pancreatic cancer.  Cervical cancer.  Pregnancy complications, including premature birth.  Stillbirths and smaller newborn babies, birth defects, and genetic damage to sperm.  Early menopause.  Lower estrogen level for women.  Infertility.  Facial wrinkles.  Blindness.  Increased risk of broken bones (fractures).  Senile dementia.  Stomach ulcers and internal bleeding.  Delayed wound healing and increased risk of complications during surgery.  Even smoking  lightly shortens your life expectancy by several years. Because of secondhand smoke exposure, children of smokers have an increased risk of the following:  Sudden infant death syndrome (SIDS).  Respiratory infections.  Lung cancer.  Heart disease.  Ear infections. What are the benefits of quitting? There are many health benefits of quitting smoking. Here are some of them:  Within days of quitting smoking, your risk of having a heart attack decreases, your blood flow improves, and your lung capacity improves. Blood pressure, pulse rate, and breathing patterns start returning to normal soon after quitting.  Within months, your lungs may clear up completely.  Quitting for 10 years reduces your risk of developing lung cancer and heart disease to almost that of a nonsmoker.  People who quit may see an improvement in their overall quality of life. How do I quit smoking? Smoking is an addiction with both physical and psychological effects, and longtime habits can be hard to change. Your health care provider can recommend:  Programs and community resources, which may include group support, education, or talk therapy.  Prescription medicines to help reduce cravings.  Nicotine replacement products, such as patches, gum, and nasal sprays. Use these products only as directed. Do not replace cigarette smoking with electronic cigarettes, which are commonly called e-cigarettes. The  safety of e-cigarettes is not known, and some may contain harmful chemicals.  A combination of two or more of these methods. Where to find more information:  American Lung Association: www.lung.org  American Cancer Society: www.cancer.org Summary  Smoking cigarettes is very bad for your health. Cigarette smokers have an increased risk of many serious medical problems, including several cancers, heart disease, and stroke.  Smoking is an addiction with both physical and psychological effects, and longtime habits  can be hard to change.  By stopping right away, you can greatly reduce the risk of medical problems for you and your family.  To help you quit smoking, your health care provider can recommend programs, community resources, prescription medicines, and nicotine replacement products such as patches, gum, and nasal sprays. This information is not intended to replace advice given to you by your health care provider. Make sure you discuss any questions you have with your health care provider. Document Released: 04/03/2004 Document Revised: 02/29/2016 Document Reviewed: 02/29/2016 Elsevier Interactive Patient Education  2017 [employer].   IF you received an x-ray today, you will receive an invoice from [facility]. Please contact [facility] at [phone] with questions or concerns regarding your invoice.   IF you received labwork today, you will receive an invoice from [facility]. Please contact [facility] at [phone] with questions or concerns regarding your invoice.   Our billing staff will not be able to assist you with questions regarding bills from these companies.  You will be contacted with the lab results as soon as they are available. The fastest way to get your results is to activate your My Chart account. Instructions are located on the last page of this paperwork. If you have not heard from [place] regarding the results in 2 weeks, please contact this office.

## 2016-05-26 NOTE — Progress Notes (Signed)
  MRN: [id number] DOB: 01-10-1966  Subjective:   Frank Silva is a 51 y.o. male presenting for chief complaint of Wheezing and Shortness of Breath  Reports 1.5 week history of worsening mildly productive cough, wheezing, shob. Patient is having chest pain with his breathing and cough. He was seen already at [facility] urgent care, was given breathing treatment and prednisone course. He is unable to tolerate prednisone but is using his albuterol inhaler twice daily. He is taking clindamycin following a tooth procedure. Smokes 1ppd but has not been smoking while he's been sick.   Frank Silva has a current medication list which includes the following prescription(s): clindamycin, albuterol, diltiazem, hydrochlorothiazide, prednisone, and rivaroxaban. Also is allergic to peanut-containing drug products.  Frank Silva  has a past medical history of A-fib (HCC); Allergy; Atrial fibrillation (HCC); Neuromuscular disorder (HCC); and Stroke (HCC). Also  has a past surgical history that includes Cardiac catheterization.  Objective:   Vitals: BP (!) 147/85 (BP Location: Right Arm, Patient Position: Sitting, Cuff Size: Normal)   Pulse 81   Temp 97.9 F (36.6 C) (Oral)   Resp 18   Ht 5\' 8"  (1.727 m)   Wt 164 lb (74.4 kg)   SpO2 96%   BMI 24.94 kg/m   BP Readings from Last 3 Encounters:  05/26/16 (!) 147/85  05/20/16 143/71  04/22/16 140/88   Physical Exam  Constitutional: He is oriented to person, place, and time. He appears well-developed and well-nourished.  HENT:  Mouth/Throat: Oropharynx is clear and moist.  Eyes: Right eye exhibits no discharge. Left eye exhibits no discharge. No scleral icterus.  Neck: Normal range of motion. Neck supple.  Cardiovascular: Normal rate, regular rhythm and intact distal pulses.  Exam reveals no gallop and no friction rub.   No murmur heard. Pulmonary/Chest: No respiratory distress. He has wheezes (diffuse throughout, R>L). He has no rales.  Lymphadenopathy:   He has no cervical adenopathy.  Neurological: He is alert and oriented to person, place, and time.  Skin: Skin is warm and dry.   Dg Chest 2 View  Result Date: 05/26/2016 CLINICAL DATA:  Cough. Atypical chest pain. Shortness of breath. Fatigue. EXAM: CHEST  2 VIEW COMPARISON:  04/30/2014 FINDINGS: Heart size and pulmonary vascularity are normal. There is peribronchial thickening but there are no consolidative infiltrates or effusions. Bones are normal. IMPRESSION: Bronchitic changes. Electronically Signed   By: Francene BoyersJames  Maxwell M.D.   On: 05/26/2016 15:06   Assessment and Plan :   1. Acute bronchitis, unspecified organism 2. Cough 3. Atypical chest pain 4. Shortness of breath - Start azithromycin. Use cough suppression, schedule albuterol inhaler. Patient refuses steroid course.   5. Tobacco use disorder - Counseled on smoking cessation  Wallis BambergMario Kyree Fedorko, [place] JerseyPA-C [facility] Care at [facility] [phone] 05/26/2016  2:40 PM

## 2016-07-25 ENCOUNTER — Ambulatory Visit: Payer: BLUE CROSS/BLUE SHIELD | Admitting: Physician Assistant

## 2016-09-06 ENCOUNTER — Other Ambulatory Visit: Payer: Self-pay | Admitting: Urgent Care

## 2016-11-04 ENCOUNTER — Other Ambulatory Visit: Payer: Self-pay | Admitting: Urgent Care

## 2016-11-06 NOTE — Telephone Encounter (Signed)
SS req refill Diltiazem CD - pharmacy name is Cartia XT 120mg  Given 30 day supply with note to Schedulers to call pt for appt prior to running out.

## 2016-11-06 NOTE — Telephone Encounter (Signed)
mychart message sent to pt about making an apt for more refills °

## 2016-11-14 ENCOUNTER — Ambulatory Visit (INDEPENDENT_AMBULATORY_CARE_PROVIDER_SITE_OTHER): Payer: BLUE CROSS/BLUE SHIELD | Admitting: Urgent Care

## 2016-11-14 ENCOUNTER — Encounter: Payer: Self-pay | Admitting: Urgent Care

## 2016-11-14 VITALS — BP 159/85 | HR 68 | Temp 97.6°F | Resp 18 | Ht 68.0 in | Wt 163.6 lb

## 2016-11-14 DIAGNOSIS — I10 Essential (primary) hypertension: Secondary | ICD-10-CM

## 2016-11-14 DIAGNOSIS — F17200 Nicotine dependence, unspecified, uncomplicated: Secondary | ICD-10-CM | POA: Diagnosis not present

## 2016-11-14 DIAGNOSIS — R030 Elevated blood-pressure reading, without diagnosis of hypertension: Secondary | ICD-10-CM | POA: Diagnosis not present

## 2016-11-14 DIAGNOSIS — J441 Chronic obstructive pulmonary disease with (acute) exacerbation: Secondary | ICD-10-CM

## 2016-11-14 DIAGNOSIS — I480 Paroxysmal atrial fibrillation: Secondary | ICD-10-CM | POA: Diagnosis not present

## 2016-11-14 MED ORDER — RIVAROXABAN 20 MG PO TABS: 20.0000 mg | ORAL_TABLET | Freq: Every day | ORAL | 3 refills | Status: DC

## 2016-11-14 MED ORDER — NICOTINE 21 MG/24HR TD PT24: 21.0000 mg | MEDICATED_PATCH | Freq: Every day | TRANSDERMAL | 0 refills | Status: DC

## 2016-11-14 MED ORDER — FULL KIT NEBULIZER SET MISC: 1.0000 [IU] | Freq: Four times a day (QID) | 0 refills | Status: DC | PRN

## 2016-11-14 MED ORDER — NICOTINE 14 MG/24HR TD PT24
14.0000 mg | MEDICATED_PATCH | Freq: Every day | TRANSDERMAL | 0 refills | Status: DC
Start: 2016-11-14 — End: 2017-02-17

## 2016-11-14 MED ORDER — DILTIAZEM HCL ER COATED BEADS 120 MG PO CP24: 120.0000 mg | ORAL_CAPSULE | Freq: Every day | ORAL | 3 refills | Status: DC

## 2016-11-14 MED ORDER — BENAZEPRIL HCL 20 MG PO TABS: 20.0000 mg | ORAL_TABLET | Freq: Every day | ORAL | 3 refills | Status: DC

## 2016-11-14 MED ORDER — ALBUTEROL SULFATE (2.5 MG/3ML) 0.083% IN NEBU: 2.5000 mg | INHALATION_SOLUTION | Freq: Four times a day (QID) | RESPIRATORY_TRACT | 1 refills | Status: DC | PRN

## 2016-11-14 NOTE — Progress Notes (Signed)
MRN: [id number] DOB: 11-01-1965  Subjective:   Frank Silva is a 51 y.o. male presenting for chief complaint of Medication Refill (Cartia and Xarelto and wants to discuss HCTZ; has to have 90 days) and other (wants to discuss getting a breathing treatment)  Coagulation therapy - Patient has been on Xarelto for stroke prevention in setting of atrial fibrillation. Denies bleeding gums, bruising, palpitations, dizziness. Smokes 1 ppd, is working on cutting back.  HTN - Managed poorly with HCTZ. He would like to change his BP medications to an ACEi. Denies chronic headache, chest pain, shortness of breath, heart racing, palpitations, nausea, vomiting, abdominal pain, hematuria, lower leg swelling.  COPD - Has been diagnosed with mild COPD. He is requesting albuterol and nebulizer machine. States that he does not have to use his albuterol inhaler often but does not notice a difference with it. He has not seen a pulmonologist previously.  Armas has a current medication list which includes the following prescription(s): albuterol, cartia xt, diltiazem, hydrochlorothiazide, and rivaroxaban. Also is allergic to peanut-containing drug products and prednisone.  Kalub  has a past medical history of A-fib ([place]); Allergy; Atrial fibrillation ([place]); Neuromuscular disorder ([place]); and Stroke ([place]). Also  has a past surgical history that includes Cardiac catheterization.  Objective:   Vitals: BP (!) 159/85   Pulse 68   Temp 97.6 F (36.4 C) (Oral)   Resp 18   Ht 5' 8"  (1.727 m)   Wt 163 lb 9.6 oz (74.2 kg)   SpO2 97%   BMI 24.88 kg/m   BP Readings from Last 3 Encounters:  11/14/16 (!) 159/85  05/26/16 (!) 147/85  05/20/16 143/71    Physical Exam  Constitutional: He is oriented to person, place, and time. He appears well-developed and well-nourished.  HENT:  Mouth/Throat: Oropharynx is clear and moist.  Eyes: No scleral icterus.  Neck: Normal range of motion. Neck supple.  Cardiovascular:  Normal rate, regular rhythm and intact distal pulses.  Exam reveals no gallop and no friction rub.   No murmur heard. Pulmonary/Chest: No respiratory distress. He has no wheezes. He has no rales.  Coarse lung sounds throughout.  Musculoskeletal: He exhibits no edema.  Lymphadenopathy:    He has no cervical adenopathy.  Neurological: He is alert and oriented to person, place, and time.  Psychiatric: He has a normal mood and affect.   Assessment and Plan :   1. Paroxysmal atrial fibrillation (HCC) - Stable, refills provided for Cartia, Xarelto.  - Comprehensive metabolic panel - diltiazem (CARTIA XT) 120 MG 24 hr capsule; Take 1 capsule (120 mg total) by mouth daily.  Dispense: 90 capsule; Refill: 3 - rivaroxaban (XARELTO) 20 MG TABS tablet; Take 1 tablet (20 mg total) by mouth daily.  Dispense: 90 tablet; Refill: 3  2. Essential hypertension 3. Elevated blood pressure reading - Stop HCTZ. Start Lotensin. Labs pending. Counseled patient on potential for adverse effects with medications prescribed today, patient verbalized understanding.  - benazepril (LOTENSIN) 20 MG tablet; Take 1 tablet (20 mg total) by mouth daily.  Dispense: 90 tablet; Refill: 3  4. COPD exacerbation ([place]) - Nebulizer kit script printed. Patient can call back if he has difficulty filling his script.  5. Tobacco use disorder - Counseled on smoking cessation. Printed scripts for Nicoderm patches. Patient will consider filling these. - nicotine (NICODERM CQ) 21 mg/24hr patch; Place 1 patch (21 mg total) onto the skin daily.  Dispense: 42 patch; Refill: 0   Jaynee Eagles, PA-C Primary Care at  [facility] [phone] 11/14/2016  8:22 AM

## 2016-11-14 NOTE — Patient Instructions (Addendum)
Benazepril tablets What is this medicine? BENAZEPRIL (ben AY ze pril) is an ACE inhibitor. This medicine is used to treat high blood pressure. This medicine may be used for other purposes; ask your health care provider or pharmacist if you have questions. COMMON BRAND NAME(S): Lotensin What should I tell my health care provider before I take this medicine? They need to know if you have any of these conditions: -bone marrow disease -heart or blood vessel disease -if you are on a special diet, such as a low salt diet -immune system disease like lupus -kidney or liver disease -low blood pressure -previous swelling of the tongue, face, or lips with difficulty breathing, difficulty swallowing, hoarseness, or tightening of the throat -an unusual or allergic reaction to benazepril, other ACE inhibitors, insect venom, foods, dyes, or preservatives -pregnant or trying to get pregnant -breast-feeding How should I use this medicine? Take this medicine by mouth with a glass of water. Follow the directions on the prescription label. Take your doses at regular intervals. Do not take your medicine more often than directed. Do not stop taking this medicine except on the advice of your doctor or health care professional. Talk to your pediatrician regarding the use of this medicine in children. Special care may be needed. While this drug may be prescribed for children as young as 6 years, precautions do apply. Overdosage: If you think you have taken too much of this medicine contact a poison control center or emergency room at once. NOTE: This medicine is only for you. Do not share this medicine with others. What if I miss a dose? If you miss a dose, take it as soon as you can. If it is almost time for your next dose, take only that dose. Do not take double or extra doses. What may interact with this medicine? Do not take this medication with any of the following medications: -sacubitril; valsartan This  medicine may also interact with the following: -diuretics -everolimus -lithium -medicines for high blood pressure -NSAIDs, medicines for pain and inflammation, like ibuprofen or naproxen -potassium salts or potassium supplements -sirolimus -temsirolimus This list may not describe all possible interactions. Give your health care provider a list of all the medicines, herbs, non-prescription drugs, or dietary supplements you use. Also tell them if you smoke, drink alcohol, or use illegal drugs. Some items may interact with your medicine. What should I watch for while using this medicine? Visit your doctor or health care professional for regular checks on your progress. Check your blood pressure as directed. Ask your doctor or health care professional what your blood pressure should be and when you should contact him or her. Call your doctor or health care professional if you notice an irregular or fast heart beat. Women should inform their doctor if they wish to become pregnant or think they might be pregnant. There is a potential for serious side effects to an unborn child. Talk to your health care professional or pharmacist for more information. Check with your doctor or health care professional if you get an attack of severe diarrhea, nausea and vomiting, or if you sweat a lot. The loss of too much body fluid can make it dangerous for you to take this medicine. You may get drowsy or dizzy. Do not drive, use machinery, or do anything that needs mental alertness until you know how this drug affects you. Do not stand or sit up quickly, especially if you are an older patient. This reduces the risk of dizzy  or fainting spells. Alcohol can make you more drowsy and dizzy. Avoid alcoholic drinks. Avoid salt substitutes unless you are told otherwise by your doctor or health care professional. Do not treat yourself for coughs, colds, or pain while you are taking this medicine without asking your doctor or  health care professional for advice. Some ingredients may increase your blood pressure. What side effects may I notice from receiving this medicine? Side effects that you should report to your doctor or health care professional as soon as possible: -allergic reactions like skin rash, itching or hives, swelling of the face, lips, or tongue -breathing problems -fast, irregular heartbeat -feeling faint or lightheaded, falls -problems swallowing -redness, blistering, peeling or loosening of the skin, including inside the mouth -swelling of ankles, legs -trouble passing urine or change in the amount of urine Side effects that usually do not require medical attention (report to your doctor or health care professional if they continue or are bothersome): -cough -headache -nausea -sun sensitivity -tiredness This list may not describe all possible side effects. Call your doctor for medical advice about side effects. You may report side effects to FDA at 1-800-FDA-1088. Where should I keep my medicine? Keep out of the reach of children. Store at room temperature below 30 degrees C (86 degrees F). Protect from moisture. Keep container tightly closed. Throw away any unused medicine after the expiration date. NOTE: This sheet is a summary. It may not cover all possible information. If you have questions about this medicine, talk to your doctor, pharmacist, or health care provider.  2018 Elsevier/Gold Standard (2015-04-20 09:22:15)     IF you received an x-ray today, you will receive an invoice from [facility]. Please contact [facility] at [phone] with questions or concerns regarding your invoice.   IF you received labwork today, you will receive an invoice from [facility]. Please contact [facility] at [phone] with questions or concerns regarding your invoice.   Our billing staff will not be able to assist you with questions regarding bills from these companies.  You  will be contacted with the lab results as soon as they are available. The fastest way to get your results is to activate your My Chart account. Instructions are located on the last page of this paperwork. If you have not heard from us regarding the results in 2 weeks, please contact this office.

## 2016-11-15 LAB — COMPREHENSIVE METABOLIC PANEL
ALT: 10 IU/L (ref 0–44)
AST: 15 IU/L (ref 0–40)
Albumin/Globulin Ratio: 1.7 (ref 1.2–2.2)
Albumin: 4.3 g/dL (ref 3.5–5.5)
Alkaline Phosphatase: 69 IU/L (ref 39–117)
BUN/Creatinine Ratio: 12 (ref 9–20)
BUN: 14 mg/dL (ref 6–24)
Bilirubin Total: 0.3 mg/dL (ref 0.0–1.2)
CO2: 25 mmol/L (ref 20–29)
Calcium: 9.3 mg/dL (ref 8.7–10.2)
Chloride: 102 mmol/L (ref 96–106)
Creatinine, Ser: 1.14 mg/dL (ref 0.76–1.27)
GFR calc Af Amer: 84 mL/min/{1.73_m2} (ref >59)
GFR calc non Af Amer: 73 mL/min/{1.73_m2} (ref >59)
Globulin, Total: 2.5 g/dL (ref 1.5–4.5)
Glucose: 84 mg/dL (ref 65–99)
Potassium: 3.9 mmol/L (ref 3.5–5.2)
Sodium: 142 mmol/L (ref 134–144)
Total Protein: 6.8 g/dL (ref 6.0–8.5)

## 2016-12-16 ENCOUNTER — Ambulatory Visit: Payer: BLUE CROSS/BLUE SHIELD | Admitting: Urgent Care

## 2016-12-18 ENCOUNTER — Ambulatory Visit: Payer: BLUE CROSS/BLUE SHIELD | Admitting: Urgent Care

## 2017-01-01 ENCOUNTER — Telehealth: Payer: Self-pay | Admitting: Urgent Care

## 2017-01-01 NOTE — Telephone Encounter (Signed)
Pt is calling stating that the Benazpril has been giving him some side-effects like headache and congestion.  He would like to try and get back on his Hydrochlorothiazide.  Please advise  [phone]

## 2017-01-02 NOTE — Telephone Encounter (Signed)
PATIENT CALLED BACK TODAY TO CHECK ON THE STATUS OF HIS MESSAGE FROM YESTERDAY (01/01/17). HE STATES THE BENAZEPRIL 20 MG IS MAKING HIM HAVE A COUGH, CONGESTION, HEADACHES AND TO RETAIN FLUID IN HIS LUNGS. HE SAID HE IS "NOT SICK" BUT HE DOES NOT LIKE THE WAY IT MAKES HIM FEEL. HE HAS TAKEN IT FOR 5 DAYS NOW. HE WANTS TO GO BACK ON THE HYDROCHLOROTHIAZIDE. BEST PHONE [phone] (NEW CELL PHONE) PLEASE DISREGARD THE PHONE NUMBER ON THE MESSAGE THAT WAS TAKEN YESTERDAY. PHARMACY CHOICE IS [facility] IN [place]. MBC

## 2017-01-05 ENCOUNTER — Ambulatory Visit: Payer: BLUE CROSS/BLUE SHIELD | Admitting: Physician Assistant

## 2017-01-05 NOTE — Telephone Encounter (Signed)
Please advise 

## 2017-01-05 NOTE — Telephone Encounter (Signed)
Pt is calling again to check on the status of his hydrochlorothiazide refill.  Please advise

## 2017-01-06 MED ORDER — HYDROCHLOROTHIAZIDE 25 MG PO TABS: 25.0000 mg | ORAL_TABLET | Freq: Every day | ORAL | 0 refills | Status: DC

## 2017-01-06 NOTE — Telephone Encounter (Signed)
Please see original message.

## 2017-01-06 NOTE — Telephone Encounter (Signed)
That's fine. I'll do HCTZ 25mg . But he needs to come back for a recheck. His BP is not controlled. Please call to schedule patient in 1 month.

## 2017-01-06 NOTE — Telephone Encounter (Signed)
lmom for pt to call back to [facility] an [facility] and to f/u on HTN

## 2017-02-17 ENCOUNTER — Other Ambulatory Visit: Payer: Self-pay

## 2017-02-17 ENCOUNTER — Encounter (HOSPITAL_COMMUNITY): Payer: Self-pay

## 2017-02-17 ENCOUNTER — Ambulatory Visit: Payer: BLUE CROSS/BLUE SHIELD | Admitting: Family Medicine

## 2017-02-17 ENCOUNTER — Encounter: Payer: Self-pay | Admitting: Family Medicine

## 2017-02-17 ENCOUNTER — Emergency Department (HOSPITAL_COMMUNITY): Payer: BLUE CROSS/BLUE SHIELD

## 2017-02-17 ENCOUNTER — Emergency Department (HOSPITAL_COMMUNITY)
Admission: EM | Admit: 2017-02-17 | Discharge: 2017-02-17 | Disposition: A | Discharge status: Home / Self Care | Payer: BLUE CROSS/BLUE SHIELD | Attending: Emergency Medicine | Admitting: Emergency Medicine

## 2017-02-17 VITALS — BP 144/82 | HR 75 | Temp 98.4°F | Resp 18 | Ht 68.0 in | Wt 169.0 lb

## 2017-02-17 DIAGNOSIS — Z8709 Personal history of other diseases of the respiratory system: Secondary | ICD-10-CM | POA: Diagnosis not present

## 2017-02-17 DIAGNOSIS — Z79899 Other long term (current) drug therapy: Secondary | ICD-10-CM | POA: Insufficient documentation

## 2017-02-17 DIAGNOSIS — Z7901 Long term (current) use of anticoagulants: Secondary | ICD-10-CM | POA: Diagnosis not present

## 2017-02-17 DIAGNOSIS — R0789 Other chest pain: Secondary | ICD-10-CM | POA: Diagnosis not present

## 2017-02-17 DIAGNOSIS — I10 Essential (primary) hypertension: Secondary | ICD-10-CM | POA: Diagnosis not present

## 2017-02-17 DIAGNOSIS — Z23 Encounter for immunization: Secondary | ICD-10-CM | POA: Diagnosis not present

## 2017-02-17 DIAGNOSIS — M62838 Other muscle spasm: Secondary | ICD-10-CM | POA: Diagnosis not present

## 2017-02-17 DIAGNOSIS — F17210 Nicotine dependence, cigarettes, uncomplicated: Secondary | ICD-10-CM | POA: Insufficient documentation

## 2017-02-17 DIAGNOSIS — J40 Bronchitis, not specified as acute or chronic: Secondary | ICD-10-CM | POA: Diagnosis not present

## 2017-02-17 DIAGNOSIS — Z91010 Allergy to peanuts: Secondary | ICD-10-CM | POA: Diagnosis not present

## 2017-02-17 DIAGNOSIS — R05 Cough: Secondary | ICD-10-CM | POA: Diagnosis present

## 2017-02-17 LAB — BASIC METABOLIC PANEL
Anion gap: 7 (ref 5–15)
BUN: 14 mg/dL (ref 6–20)
CO2: 28 mmol/L (ref 22–32)
Calcium: 9.2 mg/dL (ref 8.9–10.3)
Chloride: 102 mmol/L (ref 101–111)
Creatinine, Ser: 1.1 mg/dL (ref 0.61–1.24)
GFR calc Af Amer: >60 mL/min (ref >60)
GFR calc non Af Amer: >60 mL/min (ref >60)
Glucose, Bld: 107 mg/dL — ABNORMAL HIGH (ref 65–99)
Potassium: 3.7 mmol/L (ref 3.5–5.1)
Sodium: 137 mmol/L (ref 135–145)

## 2017-02-17 LAB — CBC
HCT: 48.5 % (ref 39.0–52.0)
Hemoglobin: 17.1 g/dL — ABNORMAL HIGH (ref 13.0–17.0)
MCH: 32.6 pg (ref 26.0–34.0)
MCHC: 35.3 g/dL (ref 30.0–36.0)
MCV: 92.4 fL (ref 78.0–100.0)
Platelets: 209 10*3/uL (ref 150–400)
RBC: 5.25 MIL/uL (ref 4.22–5.81)
RDW: 13.1 % (ref 11.5–15.5)
WBC: 8.1 10*3/uL (ref 4.0–10.5)

## 2017-02-17 LAB — I-STAT TROPONIN, ED: Troponin i, poc: 0.01 ng/mL (ref 0.00–0.08)

## 2017-02-17 LAB — TROPONIN I: Troponin I: <0.03 ng/mL (ref <0.03)

## 2017-02-17 MED ORDER — IPRATROPIUM-ALBUTEROL 0.5-2.5 (3) MG/3ML IN SOLN: 3.0000 mL | Freq: Once | RESPIRATORY_TRACT | Status: DC

## 2017-02-17 MED ORDER — ALBUTEROL SULFATE (2.5 MG/3ML) 0.083% IN NEBU
2.5000 mg | INHALATION_SOLUTION | Freq: Once | RESPIRATORY_TRACT | Status: AC
  Administered 2017-02-17: 2.5 mg via RESPIRATORY_TRACT

## 2017-02-17 MED ORDER — METHYLPREDNISOLONE 4 MG PO TBPK: ORAL_TABLET | ORAL | 0 refills | Status: DC

## 2017-02-17 MED ORDER — ALBUTEROL SULFATE (2.5 MG/3ML) 0.083% IN NEBU: 2.5000 mg | INHALATION_SOLUTION | Freq: Four times a day (QID) | RESPIRATORY_TRACT | 12 refills | Status: DC | PRN

## 2017-02-17 MED ORDER — IPRATROPIUM BROMIDE 0.02 % IN SOLN
0.5000 mg | Freq: Once | RESPIRATORY_TRACT | Status: AC
  Administered 2017-02-17: 0.5 mg via RESPIRATORY_TRACT

## 2017-02-17 NOTE — ED Triage Notes (Signed)
Patient complains of chest pain and shortness of breath x 2 days. Sent by primary after going there for same. Had albuterol neb at office pta. Alert and oriented, NAD. ALSO has tension/knot in neck

## 2017-02-17 NOTE — ED Provider Notes (Signed)
[facility] EMERGENCY DEPARTMENT Provider Note   CSN: [id number] Arrival date & time: 02/17/17  1312     History   Chief Complaint No chief complaint on file.   HPI Frank Silva is a 51 y.o. male.  51 year old male with prior history of A. fib, CVA, and chronic bronchitis presents for evaluation of cough and "chest tightness."  Patient reports that he gets frequent "chest tightness" and wheezing in the winter.  He denies chest pain.  He reports that his symptoms improved with nebulizer treatments.  He denies fever, nausea, vomiting, back pain, diaphoresis, or other acute complaint.  He went to his regular doctor today who gave him a breathing treatment.  After the breathing treatment he felt improved.  His doctor did recommend that he come to the ER for further evaluation.    He is not currently on prednisone.  He denies a history of CAD.  His last cardiac cath was approximately 13 years prior.   The history is provided by the patient.  Wheezing   This is a new problem. The current episode started 2 days ago. The problem occurs rarely. The problem has been resolved. Associated symptoms include cough. Pertinent negatives include no chest pain. Precipitated by: "cold weather" He has tried beta-agonist inhalers for the symptoms. The treatment provided significant relief. He has had no prior hospitalizations. He has had no prior ED visits. He has had no prior ICU admissions.    Past Medical History:  Diagnosis Date  . A-fib ([place])   . Allergy   . Atrial fibrillation ([place])   . Neuromuscular disorder ([place])   . Stroke [facility])     Patient Active Problem List   Diagnosis Date Noted  . PAF (paroxysmal atrial fibrillation) ([place]) 05/22/2014  . 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 Facial droop   . Stroke ([place])   . Acute CVA (cerebrovascular accident) ([place]) 04/30/2014  . Accelerated hypertension 04/30/2014  . History of Dysrhythmia, cardiac 04/30/2014     Past Surgical History:  Procedure Laterality Date  . CARDIAC CATHETERIZATION         Home Medications    Prior to Admission medications   Medication Sig Start Date End Date Taking? Authorizing Provider  albuterol (PROVENTIL HFA;VENTOLIN HFA) 108 (90 Base) MCG/ACT inhaler Inhale 2 puffs into the lungs every 6 (six) hours as needed for wheezing. 12/11/15   Jaynee Eagles, PA-C  albuterol (PROVENTIL) (2.5 MG/3ML) 0.083% nebulizer solution Take 3 mLs (2.5 mg total) by nebulization every 6 (six) hours as needed for wheezing or shortness of breath. 11/14/16   Jaynee Eagles, PA-C  diltiazem (CARDIZEM SR) 120 MG 12 hr capsule TAKE ONE CAPSULE BY MOUTH TWICE DAILY 09/10/16   English, Colletta [place] D, PA  diltiazem (CARTIA XT) 120 MG 24 hr capsule Take 1 capsule (120 mg total) by mouth daily. 11/14/16   Jaynee Eagles, PA-C  hydrochlorothiazide (HYDRODIURIL) 25 MG tablet Take 1 tablet (25 mg total) by mouth daily. 01/06/17   Jaynee Eagles, PA-C  nicotine (NICODERM CQ) 14 mg/24hr patch Place 1 patch (14 mg total) onto the skin daily. Patient not taking: Reported on 02/17/2017 11/14/16   Jaynee Eagles, PA-C  nicotine (NICODERM CQ) 21 mg/24hr patch Place 1 patch (21 mg total) onto the skin daily. Patient not taking: Reported on 02/17/2017 11/14/16   Jaynee Eagles, PA-C  Respiratory Therapy Supplies (FULL KIT NEBULIZER SET) MISC 1 Units by Does not apply route every 6 (six) hours as needed. 11/14/16  Jaynee Eagles, PA-C  rivaroxaban (XARELTO) 20 MG TABS tablet Take 1 tablet (20 mg total) by mouth daily. 11/14/16   Jaynee Eagles, PA-C    Family History Family History  Problem Relation Age of Onset  . CVA Father     Social History Social History   Tobacco Use  . Smoking status: Current Every Day Smoker    Packs/day: 1.50    Types: Cigarettes  . Smokeless tobacco: Never Used  Substance Use Topics  . Alcohol use: No    Alcohol/week: 0.0 oz  . Drug use: No     Allergies   Flexeril [cyclobenzaprine]; Peanut-containing  drug products; Grapefruit bioflavonoid complex; and Prednisone   Review of Systems Review of Systems  Respiratory: Positive for cough and wheezing.   Cardiovascular: Negative for chest pain.  All other systems reviewed and are negative.    Physical Exam Updated Vital Signs BP (!) 143/89 (BP Location: Left Arm)   Pulse 74   Temp 97.8 F (36.6 C) (Oral)   Resp 16   Ht 5' 8"  (1.727 m)   Wt 76.7 kg (169 lb)   SpO2 96%   BMI 25.70 kg/m   Physical Exam  Constitutional: He is oriented to person, place, and time. He appears well-developed and well-nourished. No distress.  HENT:  Head: Normocephalic and atraumatic.  Mouth/Throat: Oropharynx is clear and moist.  Eyes: Conjunctivae and EOM are normal. Pupils are equal, round, and reactive to light.  Neck: Normal range of motion. Neck supple.  Cardiovascular: Normal rate, regular rhythm and normal heart sounds.  Pulmonary/Chest: Effort normal and breath sounds normal. No respiratory distress.  Abdominal: Soft. Bowel sounds are normal. He exhibits no distension. There is no tenderness.  Musculoskeletal: Normal range of motion. He exhibits no edema or deformity.  Neurological: He is alert and oriented to person, place, and time.  Skin: Skin is warm and dry.  Psychiatric: He has a normal mood and affect.  Nursing note and vitals reviewed.    ED Treatments / Results  Labs (all labs ordered are listed, but only abnormal results are displayed) Labs Reviewed  BASIC METABOLIC PANEL - Abnormal; Notable for the following components:      Result Value   Glucose, Bld 107 (*)    All other components within normal limits  CBC - Abnormal; Notable for the following components:   Hemoglobin 17.1 (*)    All other components within normal limits  TROPONIN I  I-STAT TROPONIN, ED    EKG  EKG Interpretation  Date/Time:  Tuesday February 17 2017 13:21:11 EST Ventricular Rate:  73 PR Interval:  144 QRS Duration: 104 QT Interval:  414 QTC  Calculation: 456 R Axis:   -47 Text Interpretation:  Normal sinus rhythm Left axis deviation Abnormal ECG Confirmed by Dene Gentry [phone]) on 02/17/2017 5:33:53 PM       Radiology Dg Chest 2 View  Result Date: 02/17/2017 CLINICAL DATA:  Shortness of breath and pain EXAM: CHEST  2 VIEW COMPARISON:  May 26, 2016 FINDINGS: There is slight atelectasis in the left base. The lungs elsewhere clear. Heart size and pulmonary vascularity are normal. No adenopathy. No bone lesions. No pneumothorax. IMPRESSION: Slight left base atelectasis.  No edema or consolidation. Electronically Signed   By: Lowella Grip III M.D.   On: 02/17/2017 14:45    Procedures Procedures (including critical care time)  Medications Ordered in ED Medications - No data to display   Initial Impression / Assessment and Plan / ED  Course  I have reviewed the triage vital signs and the nursing notes.  Pertinent labs & imaging results that were available during my care of the patient were reviewed by me and considered in my medical decision making (see chart for details).     MSE complete  Presentation consistent with mild bronchitis.  Patient's history is not consistent with ACS.  Screening labs do not reflect ACS as well.  At time of discharge he feels improved and desires discharge home.  Close follow-up is advised.  Strict return precautions are given and understood.  Patient is agreeable to trial of Medrol Dosepak for control of his wheezing.  He has a supply of albuterol already at home.  Final Clinical Impressions(s) / ED Diagnoses   Final diagnoses:  Bronchitis    ED Discharge Orders    None       Valarie Merino, MD 02/17/17 [phone]

## 2017-02-17 NOTE — Progress Notes (Addendum)
Subjective:  By signing my name below, I, Frank Silva, attest that this documentation has been prepared under the direction and in the presence of Wendie Agreste, MD Electronically Signed: Ladene Artist, ED Scribe 02/17/2017 at 11:22 AM.   Patient ID: Frank Silva, male    DOB: January 20, 1966, 51 y.o.   MRN: [id number]  Chief Complaint  Patient presents with  . bronchial tube closed off per pt    onset: last night, tightness in right lung and needs breathing treatment   HPI Frank Silva is a 51 y.o. male who presents to Primary Care at [facility] complaining of chest tightness. H/o HTN, a-fib. Echo 04/2014. EF 55-60%. Grade 1 diastolic dysfunction. Seen for bronchitis in March with bronchospasms. H/o tobacco abuse. He was using albuterol inhaler at that time. Was treated with albuterol duo nebs.   Pt presents with some non-radiating chest tightness, R worse than L, onset last night at rest. He reports increased chest tightness with deep breathing. Pt reports some cough this morning. He does report that he typically gets some tightness with smoking and when the weather changes. Pt used his albuterol inhaler twice last night with temporary relief. Denies fever, diaphoresis, nausea, wheezing, heart palpitations, heavy lifting prior to onset. Also denies recent travel, calf swelling. H/o walking pneumonia. No h/o MI, DVT/PE.  Patient Active Problem List   Diagnosis Date Noted  . PAF (paroxysmal atrial fibrillation) ([place]) 05/22/2014  . 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 Facial droop   . Stroke ([place])   . Acute CVA (cerebrovascular accident) ([place]) 04/30/2014  . Accelerated hypertension 04/30/2014  . History of Dysrhythmia, cardiac 04/30/2014   Past Medical History:  Diagnosis Date  . A-fib ([place])   . Allergy   . Atrial fibrillation ([place])   . Neuromuscular disorder ([place])   . Stroke [facility])    Past Surgical History:  Procedure Laterality Date  . CARDIAC CATHETERIZATION      Allergies  Allergen Reactions  . Peanut-Containing Drug Products Hives and Shortness Of Breath  . Prednisone     Rash and malaise   Prior to Admission medications   Medication Sig Start Date End Date Taking? Authorizing Provider  albuterol (PROVENTIL HFA;VENTOLIN HFA) 108 (90 Base) MCG/ACT inhaler Inhale 2 puffs into the lungs every 6 (six) hours as needed for wheezing. 12/11/15   Jaynee Eagles, PA-C  albuterol (PROVENTIL) (2.5 MG/3ML) 0.083% nebulizer solution Take 3 mLs (2.5 mg total) by nebulization every 6 (six) hours as needed for wheezing or shortness of breath. 11/14/16   Jaynee Eagles, PA-C  diltiazem (CARDIZEM SR) 120 MG 12 hr capsule TAKE ONE CAPSULE BY MOUTH TWICE DAILY 09/10/16   English, Colletta Maryland D, PA  diltiazem (CARTIA XT) 120 MG 24 hr capsule Take 1 capsule (120 mg total) by mouth daily. 11/14/16   Jaynee Eagles, PA-C  hydrochlorothiazide (HYDRODIURIL) 25 MG tablet Take 1 tablet (25 mg total) by mouth daily. 01/06/17   Jaynee Eagles, PA-C  nicotine (NICODERM CQ) 14 mg/24hr patch Place 1 patch (14 mg total) onto the skin daily. 11/14/16   Jaynee Eagles, PA-C  nicotine (NICODERM CQ) 21 mg/24hr patch Place 1 patch (21 mg total) onto the skin daily. 11/14/16   Jaynee Eagles, PA-C  Respiratory Therapy Supplies (FULL KIT NEBULIZER SET) MISC 1 Units by Does not apply route every 6 (six) hours as needed. 11/14/16   Jaynee Eagles, PA-C  rivaroxaban (XARELTO) 20 MG TABS tablet Take 1 tablet (20 mg total) by mouth daily. 11/14/16  Jaynee Eagles, PA-C   Social History   Socioeconomic History  . Marital status: Married    Spouse name: Frank Silva  . Number of children: Not on file  . Years of education: 36  . Highest education level: Not on file  Social Needs  . Financial resource strain: Not on file  . Food insecurity - worry: Not on file  . Food insecurity - inability: Not on file  . Transportation needs - medical: Not on file  . Transportation needs - non-medical: Not on file  Occupational History  .  Occupation: Unemployed  Tobacco Use  . Smoking status: Current Every Day Smoker    Packs/day: 1.50    Types: Cigarettes  . Smokeless tobacco: Never Used  Substance and Sexual Activity  . Alcohol use: No    Alcohol/week: 0.0 oz  . Drug use: No  . Sexual activity: Yes  Other Topics Concern  . Not on file  Social History Narrative   Lives at home with wife.   Caffeine use: Drink coke (5-6 glasses per day)   Review of Systems  Constitutional: Negative for diaphoresis and fever.  Respiratory: Positive for cough (mild) and chest tightness. Negative for wheezing.   Cardiovascular: Negative for palpitations and leg swelling.  Gastrointestinal: Negative for nausea.      Objective:   Physical Exam  Constitutional: He is oriented to person, place, and time. He appears well-developed and well-nourished.  HENT:  Head: Normocephalic and atraumatic.  Eyes: EOM are normal. Pupils are equal, round, and reactive to light.  Neck: No JVD present. Carotid bruit is not present.  Cardiovascular: Normal rate, regular rhythm and normal heart sounds. Exam reveals no gallop and no friction rub.  No murmur heard. Pulmonary/Chest: Effort normal and breath sounds normal. He has no rales.  Lungs are clear to auscultation.  Musculoskeletal: He exhibits no edema.  Neurological: He is alert and oriented to person, place, and time.  Skin: Skin is warm and dry.  Psychiatric: He has a normal mood and affect.  Vitals reviewed.  Vitals:   02/17/17 1111  BP: (!) 144/82  Pulse: 75  Resp: 18  Temp: 98.4 F (36.9 C)  TempSrc: Oral  SpO2: 98%  Weight: 169 lb (76.7 kg)  Height: _0  (1.727 m)   EKG reading done by Wendie Agreste, MD: sinus rhythm LAFB. No apparent acute findings and no apparent changes from 08/2014.   Peak flow approximately 250, but difficulty with effort.    Assessment & Plan:   Frank Silva is a 51 y.o. male Chest tightness - Plan: EKG 12-Lead, CANCELED: DG Chest 2  View History of acute bronchitis with bronchospasm - Plan: EKG 12-Lead, albuterol (PROVENTIL) (2.5 MG/3ML) 0.083% nebulizer solution 2.5 mg, ipratropium (ATROVENT) nebulizer solution 0.5 mg, CANCELED: DG Chest 2 View  - Reported history of same with bronchospasm. However his lungs were clear on initial evaluation, no change after albuterol/Atrovent neb. Persistent chest tightness, right greater than left. Initially reports that tightness as a level V, but notes only with inspiration, now states it is a level IV.  -without improvement with nebulizers, clear lungs, and history of tobacco use, will have further evaluations or emergency room. Unable to perform x-ray on site today. Discussed EMS transport, but he would like to go by private vehicle. 911 precautions discussed on the way there. Advised triage nurse at [facility].  Symptomatic care discussed for neck spasm/pain. RTC precautions if persistent.  Need for prophylactic vaccination against Streptococcus  pneumoniae (pneumococcus) - Plan: Pneumococcal polysaccharide vaccine 23-valent greater than or equal to 2yo subcutaneous/IM  Need for Tdap vaccination - Plan: Tdap vaccine greater than or equal to 7yo IM  Over 40 minutes total care with greater than 50% counseling. sent to ER based on potential emergent nature of symptoms.    Meds ordered this encounter  Medications  . albuterol (PROVENTIL) (2.5 MG/3ML) 0.083% nebulizer solution 2.5 mg  . ipratropium (ATROVENT) nebulizer solution 0.5 mg   Patient Instructions   I do not see any concerns on your EKG compared to previous readings. However with persistent tightness, even after breathing treatment, I recommend further evaluation through the emergency room. I advised the triage nurse that you are on your way. If any change or worsening symptoms on the way, pull over and call 911.  For your neck pain if, it appears to be spasm of the muscles of the neck and shoulder. Initially I would  try heat and ice, gentle stretching, Tylenol if needed, then follow-up if that is not improving the next few days. You can also discuss the symptoms with emergency room provider to see if they would prescribe any muscle relaxers depending on your other evaluation.   IF you received an x-ray today, you will receive an invoice from [facility]. Please contact [facility] at [phone] with questions or concerns regarding your invoice.   IF you received labwork today, you will receive an invoice from [place]. Please contact [facility] at [phone] with questions or concerns regarding your invoice.   Our billing staff will not be able to assist you with questions regarding bills from these companies.  You will be contacted with the lab results as soon as they are available. The fastest way to get your results is to activate your My Chart account. Instructions are located on the last page of this paperwork. If you have not heard from [place] regarding the results in 2 weeks, please contact this office.

## 2017-02-17 NOTE — Patient Instructions (Addendum)
I do not see any concerns on your EKG compared to previous readings. However with persistent tightness, even after breathing treatment, I recommend further evaluation through the emergency room. I advised the triage nurse that you are on your way. If any change or worsening symptoms on the way, pull over and call 911.  For your neck pain if, it appears to be spasm of the muscles of the neck and shoulder. Initially I would try heat and ice, gentle stretching, Tylenol if needed, then follow-up if that is not improving the next few days. You can also discuss the symptoms with emergency room provider to see if they would prescribe any muscle relaxers depending on your other evaluation.   IF you received an x-ray today, you will receive an invoice from [facility]. Please contact [facility] at [phone] with questions or concerns regarding your invoice.   IF you received labwork today, you will receive an invoice from [facility]. Please contact [facility] at [phone] with questions or concerns regarding your invoice.   Our billing staff will not be able to assist you with questions regarding bills from these companies.  You will be contacted with the lab results as soon as they are available. The fastest way to get your results is to activate your My Chart account. Instructions are located on the last page of this paperwork. If you have not heard from us regarding the results in 2 weeks, please contact this office.

## 2017-02-26 ENCOUNTER — Encounter: Payer: Self-pay | Admitting: Urgent Care

## 2017-02-26 ENCOUNTER — Ambulatory Visit: Payer: BLUE CROSS/BLUE SHIELD | Admitting: Urgent Care

## 2017-02-26 VITALS — BP 130/90 | HR 77 | Temp 97.6°F | Resp 16 | Ht 68.0 in | Wt 166.6 lb

## 2017-02-26 DIAGNOSIS — S161XXA Strain of muscle, fascia and tendon at neck level, initial encounter: Secondary | ICD-10-CM | POA: Diagnosis not present

## 2017-02-26 DIAGNOSIS — M62838 Other muscle spasm: Secondary | ICD-10-CM

## 2017-02-26 DIAGNOSIS — M542 Cervicalgia: Secondary | ICD-10-CM | POA: Diagnosis not present

## 2017-02-26 MED ORDER — METHYLPREDNISOLONE ACETATE 80 MG/ML IJ SUSP
80.0000 mg | Freq: Once | INTRAMUSCULAR | Status: AC
  Administered 2017-02-26: 80 mg via INTRAMUSCULAR

## 2017-02-26 MED ORDER — METHOCARBAMOL 750 MG PO TABS: 750.0000 mg | ORAL_TABLET | Freq: Three times a day (TID) | ORAL | 1 refills | Status: DC | PRN

## 2017-02-26 NOTE — Patient Instructions (Addendum)
Cervical Sprain A cervical sprain is a stretch or tear in the tissues that connect bones (ligaments) in the neck. Most neck (cervical) sprains get better in 4-6 weeks. Follow these instructions at home: If you have a neck collar:  Wear it as told by your doctor. Do not take off (do not remove) the collar unless your doctor says that this is safe.  Ask your doctor before adjusting your collar.  If you have long hair, keep it outside of the collar.  Ask your doctor if you may take off the collar for cleaning and bathing. If you may take off the collar: ? Follow instructions from your doctor about how to take off the collar safely. ? Clean the collar by wiping it with mild soap and water. Let it air-dry all the way. ? If your collar has removable pads:  Take the pads out every 1-2 days.  Hand wash the pads with soap and water.  Let the pads air-dry all the way before you put them back in the collar. Do not dry them in a clothes dryer. Do not dry them with a hair dryer. ? Check your skin under the collar for irritation or sores. If you see any, tell your doctor. Managing pain, stiffness, and swelling  Use a cervical traction device, if told by your doctor.  If told, put heat on the affected area. Do this before exercises (physical therapy) or as often as told by your doctor. Use the heat source that your doctor recommends, such as a moist heat pack or a heating pad. ? Place a towel between your skin and the heat source. ? Leave the heat on for 20-30 minutes. ? Take the heat off (remove the heat) if your skin turns bright red. This is very important if you cannot feel pain, heat, or cold. You may have a greater risk of getting burned.  Put ice on the affected area. ? Put ice in a plastic bag. ? Place a towel between your skin and the bag. ? Leave the ice on for 20 minutes, 2-3 times a day. Activity  Do not drive while wearing a neck collar. If you do not have a neck collar, ask your  doctor if it is safe to drive.  Do not drive or use heavy machinery while taking prescription pain medicine or muscle relaxants, unless your doctor approves.  Do not lift anything that is heavier than 10 lb (4.5 kg) until your doctor tells you that it is safe.  Rest as told by your doctor.  Avoid activities that make you feel worse. Ask your doctor what activities are safe for you.  Do exercises as told by your doctor or physical therapist. Preventing neck sprain  Practice good posture. Adjust your workstation to help with this, if needed.  Exercise regularly as told by your doctor or physical therapist.  Avoid activities that are risky or may cause a neck sprain (cervical sprain). General instructions  Take over-the-counter and prescription medicines only as told by your doctor.  Do not use any products that contain nicotine or tobacco. This includes cigarettes and e-cigarettes. If you need help quitting, ask your doctor.  Keep all follow-up visits as told by your doctor. This is important. Contact a doctor if:  You have pain or other symptoms that get worse.  You have symptoms that do not get better after 2 weeks.  You have pain that does not get better with medicine.  You start to have new,   unexplained symptoms.  You have sores or irritated skin from wearing your neck collar. Get help right away if:  You have very bad pain.  You have any of the following in any part of your body: ? Loss of feeling (numbness). ? Tingling. ? Weakness.  You cannot move a part of your body (you have paralysis).  Your activity level does not improve. Summary  A cervical sprain is a stretch or tear in the tissues that connect bones (ligaments) in the neck.  If you have a neck (cervical) collar, do not take off the collar unless your doctor says that this is safe.  Put ice on affected areas as told by your doctor.  Put heat on affected areas as told by your doctor.  Good posture  and regular exercise can help prevent a neck sprain from happening again. This information is not intended to replace advice given to you by your health care provider. Make sure you discuss any questions you have with your health care provider. Document Released: 08/13/2007 Document Revised: 11/06/2015 Document Reviewed: 11/06/2015 Elsevier Interactive Patient Education  2017 Elsevier Inc.     IF you received an x-ray today, you will receive an invoice from [facility]. Please contact [facility] at [phone] with questions or concerns regarding your invoice.   IF you received labwork today, you will receive an invoice from [facility]. Please contact [facility] at [phone] with questions or concerns regarding your invoice.   Our billing staff will not be able to assist you with questions regarding bills from these companies.  You will be contacted with the lab results as soon as they are available. The fastest way to get your results is to activate your My Chart account. Instructions are located on the last page of this paperwork. If you have not heard from us regarding the results in 2 weeks, please contact this office.      

## 2017-02-26 NOTE — Progress Notes (Signed)
  MRN: [id number] DOB: 1965/05/17  Subjective:   Frank Silva is a 51 y.o. male presenting for 2 week history of persistent and worsening neck pain that radiates into his left trapezius. Pain is constant, severe. Symptoms occurred after he performed strenuous lifting activity at work. Feels that he strained his neck. Has been using 850mg  APAP consistently without any relief. He has been instructed to not use NSAIDs due to being on Xarelto.  Frank Silva has a current medication list which includes the following prescription(s): albuterol, albuterol, albuterol, diltiazem, hydrochlorothiazide, methylprednisolone, nicotine, OVER THE COUNTER MEDICATION, rivaroxaban, and acetaminophen. Also is allergic to flexeril [cyclobenzaprine]; peanut-containing drug products; grapefruit bioflavonoid complex; other; and prednisone.  Frank Silva  has a past medical history of A-fib [facility](HCC), Allergy, Atrial fibrillation (HCC), Neuromuscular disorder (HCC), and Stroke (HCC). Also  has a past surgical history that includes Cardiac catheterization.  Objective:   Vitals: BP 130/90   Pulse 77   Temp 97.6 F (36.4 C) (Oral)   Resp 16   Ht 5\' 8"  (1.727 m)   Wt 166 lb 9.6 oz (75.6 kg)   SpO2 97%   BMI 25.33 kg/m   Physical Exam  Constitutional: He is oriented to person, place, and time. He appears well-developed and well-nourished.  Cardiovascular: Normal rate.  Pulmonary/Chest: Effort normal.  Musculoskeletal:       Cervical back: He exhibits decreased range of motion (rotation to the left), tenderness (left trapezius) and spasm (left trapezius). He exhibits no bony tenderness, no swelling, no edema, no deformity and no laceration.  Neurological: He is alert and oriented to person, place, and time.   Assessment and Plan :   Strain of neck muscle, initial encounter  Neck pain - Plan: methylPREDNISolone acetate (DEPO-MEDROL) injection 80 mg, Ambulatory referral to Orthopedic Surgery  Trapezius muscle spasm   IM  Depomedrol today, referral to ortho is pending. Hydrate well, rest. RTC in 1 week if symptoms persist.  Frank BambergMario Montrell Cessna, PA-C Primary Care at [facility] [phone] 02/26/2017  12:04 PM

## 2017-03-05 ENCOUNTER — Ambulatory Visit: Payer: BLUE CROSS/BLUE SHIELD | Admitting: Urgent Care

## 2017-05-07 ENCOUNTER — Telehealth: Payer: Self-pay | Admitting: Family Medicine

## 2017-05-07 NOTE — Telephone Encounter (Signed)
Copied from CRM [id number]. Topic: Quick Communication - Rx Refill/Question >> May 07, 2017  2:32 PM Eston Mouldavis, Haevyn Ury B wrote: Medication: rivaroxaban (XARELTO) 20 MG TABS tablet  Has the patient contacted their pharmacy? No refills   (Agent: If no, request that the patient contact the pharmacy for the refill.)   Preferred Pharmacy (with phone number or street name): [facility] [address] - [place], [place] - [phone] (Phone) [phone] (Fax)     Agent: Please be advised that RX refills may take up to 3 business days. We ask that you follow-up with your pharmacy.

## 2017-05-08 NOTE — Telephone Encounter (Signed)
Sp[facility] with Thayer [facility]ris [facility] and states that the pt does have refills of Xarelto available but they had not been requested. Requested for Xarelto to be refilled for pt.  Attempted to call pt on number listed in chart, [phone] but recording states that the number has been changed, disconnected or is no longer in service.

## 2017-06-17 ENCOUNTER — Encounter: Payer: Self-pay | Admitting: Physician Assistant

## 2017-06-30 ENCOUNTER — Telehealth: Payer: Self-pay | Admitting: Physician Assistant

## 2017-06-30 DIAGNOSIS — I10 Essential (primary) hypertension: Secondary | ICD-10-CM

## 2017-06-30 NOTE — Telephone Encounter (Signed)
Copied from CRM [id number]. Topic: Quick Communication - See Telephone Encounter >> Jun 30, 2017 12:45 PM Landry MellowFoltz, Melissa J wrote: CRM for notification. See Telephone encounter for: 06/30/17.  [facility] - the refills that were sent in were 25mg  but pt normally takes 12.5mg  . Pharm wants to make sure that this is correct to increase dose  [phone]

## 2017-07-01 ENCOUNTER — Other Ambulatory Visit: Payer: Self-pay

## 2017-07-01 MED ORDER — HYDROCHLOROTHIAZIDE 12.5 MG PO TABS: 12.5000 mg | ORAL_TABLET | Freq: Every day | ORAL | 0 refills | Status: DC

## 2017-07-01 NOTE — Telephone Encounter (Signed)
Refill for 12.5 HCTZ sent to pharmacy #[id number]  With note to follow up w/labs, eval in June 2019.

## 2017-07-01 NOTE — Telephone Encounter (Signed)
Frank LeeSabrina called for status update on HCTZ RX. Pt states it should be for 12.5 but RX sent was for 25mg . Pt is out of medication. Please clarify.  [facility] [address] - [place], [place] - [address] [phone] (Phone) [phone] (Fax)

## 2017-07-08 ENCOUNTER — Other Ambulatory Visit: Payer: Self-pay | Admitting: Urgent Care

## 2017-07-08 NOTE — Telephone Encounter (Signed)
Copied from CRM [id number]. Topic: Quick Communication - Rx Refill/Question >> Jul 08, 2017  3:16 PM Alexander Bergeron B wrote: Medication: albuterol (PROVENTIL HFA;VENTOLIN HFA) 108 (90 Base) MCG/ACT inhaler [[id number]]  Has the patient contacted their pharmacy? Yes.   (Agent: If no, request that the patient contact the pharmacy for the refill.) Preferred Pharmacy (with phone number or street name): [facility] in [place] Agent: Please be advised that RX refills may take up to 3 business days. We ask that you follow-up with your pharmacy.

## 2017-08-28 ENCOUNTER — Encounter: Payer: Self-pay | Admitting: Urgent Care

## 2017-08-28 ENCOUNTER — Ambulatory Visit (INDEPENDENT_AMBULATORY_CARE_PROVIDER_SITE_OTHER): Payer: BLUE CROSS/BLUE SHIELD

## 2017-08-28 ENCOUNTER — Ambulatory Visit: Payer: BLUE CROSS/BLUE SHIELD | Admitting: Urgent Care

## 2017-08-28 VITALS — BP 144/82 | HR 84 | Temp 98.5°F | Resp 16 | Ht 68.0 in | Wt 158.0 lb

## 2017-08-28 DIAGNOSIS — J3489 Other specified disorders of nose and nasal sinuses: Secondary | ICD-10-CM | POA: Diagnosis not present

## 2017-08-28 DIAGNOSIS — R0989 Other specified symptoms and signs involving the circulatory and respiratory systems: Secondary | ICD-10-CM | POA: Diagnosis not present

## 2017-08-28 DIAGNOSIS — R05 Cough: Secondary | ICD-10-CM | POA: Diagnosis not present

## 2017-08-28 DIAGNOSIS — K1370 Unspecified lesions of oral mucosa: Secondary | ICD-10-CM

## 2017-08-28 DIAGNOSIS — M25551 Pain in right hip: Secondary | ICD-10-CM

## 2017-08-28 DIAGNOSIS — G8929 Other chronic pain: Secondary | ICD-10-CM

## 2017-08-28 DIAGNOSIS — R059 Cough, unspecified: Secondary | ICD-10-CM

## 2017-08-28 DIAGNOSIS — M1611 Unilateral primary osteoarthritis, right hip: Secondary | ICD-10-CM | POA: Diagnosis not present

## 2017-08-28 MED ORDER — BENZONATATE 100 MG PO CAPS: 100.0000 mg | ORAL_CAPSULE | Freq: Three times a day (TID) | ORAL | 0 refills | Status: DC | PRN

## 2017-08-28 MED ORDER — PREDNISONE 10 MG PO TABS: 40.0000 mg | ORAL_TABLET | Freq: Every day | ORAL | 0 refills | Status: DC

## 2017-08-28 MED ORDER — ALBUTEROL SULFATE HFA 108 (90 BASE) MCG/ACT IN AERS: INHALATION_SPRAY | RESPIRATORY_TRACT | 3 refills | Status: DC

## 2017-08-28 MED ORDER — DOXYCYCLINE HYCLATE 100 MG PO CAPS: 100.0000 mg | ORAL_CAPSULE | Freq: Two times a day (BID) | ORAL | 0 refills | Status: DC
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
## 2017-08-28 NOTE — Patient Instructions (Addendum)
Arthritis Arthritis is a term that is commonly used to refer to joint pain or joint disease. There are more than 100 types of arthritis. What are the causes? The most common cause of this condition is wear and tear of a joint. Other causes include:  Gout.  Inflammation of a joint.  An infection of a joint.  Sprains and other injuries near the joint.  A drug reaction or allergic reaction.  In some cases, the cause may not be known. What are the signs or symptoms? The main symptom of this condition is pain in the joint with movement. Other symptoms include:  Redness, swelling, or stiffness at a joint.  Warmth coming from the joint.  Fever.  Overall feeling of illness.  How is this diagnosed? This condition may be diagnosed with a physical exam and tests, including:  Blood tests.  Urine tests.  Imaging tests, such as MRI, X-rays, or a CT scan.  Sometimes, fluid is removed from a joint for testing. How is this treated? Treatment for this condition may involve:  Treatment of the cause, if it is known.  Rest.  Raising (elevating) the joint.  Applying cold or hot packs to the joint.  Medicines to improve symptoms and reduce inflammation.  Injections of a steroid such as cortisone into the joint to help reduce pain and inflammation.  Depending on the cause of your arthritis, you may need to make lifestyle changes to reduce stress on your joint. These changes may include exercising more and losing weight. Follow these instructions at home: Medicines  Take over-the-counter and prescription medicines only as told by your health care provider.  Do not take aspirin to relieve pain if gout is suspected. Activity  Rest your joint if told by your health care provider. Rest is important when your disease is active and your joint feels painful, swollen, or stiff.  Avoid activities that make the pain worse. It is important to balance activity with rest.  Exercise your  joint regularly with range-of-motion exercises as told by your health care provider. Try doing low-impact exercise, such as: ? Swimming. ? Water aerobics. ? Biking. ? Walking. Joint Care   If your joint is swollen, keep it elevated if told by your health care provider.  If your joint feels stiff in the morning, try taking a warm shower.  If directed, apply heat to the joint. If you have diabetes, do not apply heat without permission from your health care provider. ? Put a towel between the joint and the hot pack or heating pad. ? Leave the heat on the area for 20-30 minutes.  If directed, apply ice to the joint: ? Put ice in a plastic bag. ? Place a towel between your skin and the bag. ? Leave the ice on for 20 minutes, 2-3 times per day.  Keep all follow-up visits as told by your health care provider. This is important. Contact a health care provider if:  The pain gets worse.  You have a fever. Get help right away if:  You develop severe joint pain, swelling, or redness.  Many joints become painful and swollen.  You develop severe back pain.  You develop severe weakness in your leg.  You cannot control your bladder or bowels. This information is not intended to replace advice given to you by your health care provider. Make sure you discuss any questions you have with your health care provider. Document Released: 04/03/2004 Document Revised: 08/02/2015 Document Reviewed: 05/22/2014 Elsevier Interactive Patient   Education  2018 [employer].     Hip Pain The hip is the joint between the upper legs and the lower pelvis. The bones, cartilage, tendons, and muscles of your hip joint support your body and allow you to move around. Hip pain can range from a minor ache to severe pain in one or both of your hips. The pain may be felt on the inside of the hip joint near the groin, or the outside near the buttocks and upper thigh. You may also have swelling or stiffness. Follow  these instructions at home: Managing pain, stiffness, and swelling  If directed, apply ice to the injured area. ? Put ice in a plastic bag. ? Place a towel between your skin and the bag. ? Leave the ice on for 20 minutes, 2-3 times a day  Sleep with a pillow between your legs on your most comfortable side.  Avoid any activities that cause pain. General instructions  Take over-the-counter and prescription medicines only as told by your health care provider.  Do any exercises as told by your health care provider.  Record the following: ? How often you have hip pain. ? The location of your pain. ? What the pain feels like. ? What makes the pain worse.  Keep all follow-up visits as told by your health care provider. This is important. Contact a health care provider if:  You cannot put weight on your leg.  Your pain or swelling continues or gets worse after one week.  It gets harder to walk.  You have a fever. Get help right away if:  You fall.  You have a sudden increase in pain and swelling in your hip.  Your hip is red or swollen or very tender to touch. Summary  Hip pain can range from a minor ache to severe pain in one or both of your hips.  The pain may be felt on the inside of the hip joint near the groin, or the outside near the buttocks and upper thigh.  Avoid any activities that cause pain.  Record how often you have hip pain, the location of the pain, what makes it worse and what it feels like. This information is not intended to replace advice given to you by your health care provider. Make sure you discuss any questions you have with your health care provider. Document Released: 08/14/2009 Document Revised: 01/28/2016 Document Reviewed: 01/28/2016 Elsevier Interactive Patient Education  2018 [employer] Inc.    Cough, Adult A cough helps to clear your throat and lungs. A cough may last only 2-3 weeks (acute), or it may last longer than 8 weeks (chronic).  Many different things can cause a cough. A cough may be a sign of an illness or another medical condition. Follow these instructions at home:  Pay attention to any changes in your cough.  Take medicines only as told by your doctor. ? If you were prescribed an antibiotic medicine, take it as told by your doctor. Do not stop taking it even if you start to feel better. ? Talk with your doctor before you try using a cough medicine.  Drink enough fluid to keep your pee (urine) clear or pale yellow.  If the air is dry, use a cold steam vaporizer or humidifier in your home.  Stay away from things that make you cough at work or at home.  If your cough is worse at night, try using extra pillows to raise your head up higher while you sleep.  Do not smoke, and try not to be around smoke. If you need help quitting, ask your doctor.  Do not have caffeine.  Do not drink alcohol.  Rest as needed. Contact a doctor if:  You have new problems (symptoms).  You cough up yellow fluid (pus).  Your cough does not get better after 2-3 weeks, or your cough gets worse.  Medicine does not help your cough and you are not sleeping well.  You have pain that gets worse or pain that is not helped with medicine.  You have a fever.  You are losing weight and you do not know why.  You have night sweats. Get help right away if:  You cough up blood.  You have trouble breathing.  Your heartbeat is very fast. This information is not intended to replace advice given to you by your health care provider. Make sure you discuss any questions you have with your health care provider. Document Released: 11/07/2010 Document Revised: 08/02/2015 Document Reviewed: 05/03/2014 Elsevier Interactive Patient Education  2018 [employer]     IF you received an x-ray today, you will receive an invoice from [facility]. Please contact [facility] at [phone] with questions or concerns regarding  your invoice.   IF you received labwork today, you will receive an invoice from [facility]. Please contact [facility] at [phone] with questions or concerns regarding your invoice.   Our billing staff will not be able to assist you with questions regarding bills from these companies.  You will be contacted with the lab results as soon as they are available. The fastest way to get your results is to activate your My Chart account. Instructions are located on the last page of this paperwork. If you have not heard from us regarding the results in 2 weeks, please contact this office.

## 2017-09-26 ENCOUNTER — Other Ambulatory Visit: Payer: Self-pay | Admitting: Urgent Care

## 2017-09-26 DIAGNOSIS — I480 Paroxysmal atrial fibrillation: Secondary | ICD-10-CM

## 2017-10-13 ENCOUNTER — Ambulatory Visit: Payer: BLUE CROSS/BLUE SHIELD | Admitting: Urgent Care

## 2017-10-15 ENCOUNTER — Encounter: Payer: Self-pay | Admitting: Urgent Care

## 2017-10-15 ENCOUNTER — Ambulatory Visit: Payer: BLUE CROSS/BLUE SHIELD | Admitting: Urgent Care

## 2017-10-15 VITALS — BP 141/81 | HR 80 | Temp 98.6°F | Resp 18 | Ht 68.0 in | Wt 158.8 lb

## 2017-10-15 DIAGNOSIS — Z114 Encounter for screening for human immunodeficiency virus [HIV]: Secondary | ICD-10-CM

## 2017-10-15 DIAGNOSIS — R030 Elevated blood-pressure reading, without diagnosis of hypertension: Secondary | ICD-10-CM | POA: Diagnosis not present

## 2017-10-15 DIAGNOSIS — I10 Essential (primary) hypertension: Secondary | ICD-10-CM

## 2017-10-15 DIAGNOSIS — Z1211 Encounter for screening for malignant neoplasm of colon: Secondary | ICD-10-CM | POA: Diagnosis not present

## 2017-10-15 DIAGNOSIS — I480 Paroxysmal atrial fibrillation: Secondary | ICD-10-CM

## 2017-10-15 DIAGNOSIS — Z1159 Encounter for screening for other viral diseases: Secondary | ICD-10-CM

## 2017-10-15 LAB — COMPREHENSIVE METABOLIC PANEL
ALT: 15 IU/L (ref 0–44)
AST: 13 IU/L (ref 0–40)
Albumin/Globulin Ratio: 1.8 (ref 1.2–2.2)
Albumin: 4.3 g/dL (ref 3.5–5.5)
Alkaline Phosphatase: 58 IU/L (ref 39–117)
BUN/Creatinine Ratio: 12 (ref 9–20)
BUN: 13 mg/dL (ref 6–24)
Bilirubin Total: 0.7 mg/dL (ref 0.0–1.2)
CO2: 27 mmol/L (ref 20–29)
Calcium: 9.6 mg/dL (ref 8.7–10.2)
Chloride: 101 mmol/L (ref 96–106)
Creatinine, Ser: 1.13 mg/dL (ref 0.76–1.27)
GFR calc Af Amer: 85 mL/min/{1.73_m2} (ref >59)
GFR calc non Af Amer: 73 mL/min/{1.73_m2} (ref >59)
Globulin, Total: 2.4 g/dL (ref 1.5–4.5)
Glucose: 76 mg/dL (ref 65–99)
Potassium: 3.6 mmol/L (ref 3.5–5.2)
Sodium: 142 mmol/L (ref 134–144)
Total Protein: 6.7 g/dL (ref 6.0–8.5)

## 2017-10-15 LAB — LIPID PANEL
Chol/HDL Ratio: 3.1 ratio (ref 0.0–5.0)
Cholesterol, Total: 148 mg/dL (ref 100–199)
HDL: 48 mg/dL (ref >39)
LDL Calculated: 77 mg/dL (ref 0–99)
Triglycerides: 117 mg/dL (ref 0–149)
VLDL Cholesterol Cal: 23 mg/dL (ref 5–40)

## 2017-10-15 MED ORDER — ALBUTEROL SULFATE HFA 108 (90 BASE) MCG/ACT IN AERS: INHALATION_SPRAY | RESPIRATORY_TRACT | 3 refills | Status: DC

## 2017-10-15 MED ORDER — RIVAROXABAN 20 MG PO TABS: 20.0000 mg | ORAL_TABLET | Freq: Every day | ORAL | 3 refills | Status: DC

## 2017-10-15 MED ORDER — HYDROCHLOROTHIAZIDE 12.5 MG PO TABS: ORAL_TABLET | ORAL | 3 refills | Status: DC

## 2017-10-15 MED ORDER — PREDNISONE 20 MG PO TABS: 20.0000 mg | ORAL_TABLET | Freq: Every day | ORAL | 0 refills | Status: DC

## 2017-10-15 MED ORDER — DILTIAZEM HCL ER COATED BEADS 120 MG PO CP24: 120.0000 mg | ORAL_CAPSULE | Freq: Every day | ORAL | 3 refills | Status: DC

## 2017-10-15 NOTE — Patient Instructions (Addendum)
You may take 500mg  Tylenol every 6 hours for pain and inflammation. Use prednisone for severe hip pain that may be related to your arthritis.     Managing Your Hypertension Hypertension is commonly called high blood pressure. This is when the force of your blood pressing against the walls of your arteries is too strong. Arteries are blood vessels that carry blood from your heart throughout your body. Hypertension forces the heart to work harder to pump blood, and may cause the arteries to become narrow or stiff. Having untreated or uncontrolled hypertension can cause heart attack, stroke, kidney disease, and other problems. What are blood pressure readings? A blood pressure reading consists of a higher number over a lower number. Ideally, your blood pressure should be below 120/80. The first ("top") number is called the systolic pressure. It is a measure of the pressure in your arteries as your heart beats. The second ("bottom") number is called the diastolic pressure. It is a measure of the pressure in your arteries as the heart relaxes. What does my blood pressure reading mean? Blood pressure is classified into four stages. Based on your blood pressure reading, your health care provider may use the following stages to determine what type of treatment you need, if any. Systolic pressure and diastolic pressure are measured in a unit called mm Hg. Normal  Systolic pressure: below 120.  Diastolic pressure: below 80. Elevated  Systolic pressure: 120-129.  Diastolic pressure: below 80. Hypertension stage 1  Systolic pressure: 130-139.  Diastolic pressure: 80-89. Hypertension stage 2  Systolic pressure: 140 or above.  Diastolic pressure: 90 or above. What health risks are associated with hypertension? Managing your hypertension is an important responsibility. Uncontrolled hypertension can lead to:  A heart attack.  A stroke.  A weakened blood vessel (aneurysm).  Heart  failure.  Kidney damage.  Eye damage.  Metabolic syndrome.  Memory and concentration problems.  What changes can I make to manage my hypertension? Hypertension can be managed by making lifestyle changes and possibly by taking medicines. Your health care provider will help you make a plan to bring your blood pressure within a normal range. Eating and drinking  Eat a diet that is high in fiber and potassium, and low in salt (sodium), added sugar, and fat. An example eating plan is called the DASH (Dietary Approaches to Stop Hypertension) diet. To eat this way: ? Eat plenty of fresh fruits and vegetables. Try to fill half of your plate at each meal with fruits and vegetables. ? Eat whole grains, such as whole wheat pasta, brown rice, or whole grain bread. Fill about one quarter of your plate with whole grains. ? Eat low-fat diary products. ? Avoid fatty cuts of meat, processed or cured meats, and poultry with skin. Fill about one quarter of your plate with lean proteins such as fish, chicken without skin, beans, eggs, and tofu. ? Avoid premade and processed foods. These tend to be higher in sodium, added sugar, and fat.  Reduce your daily sodium intake. Most people with hypertension should eat less than 1,500 mg of sodium a day.  Limit alcohol intake to no more than 1 drink a day for nonpregnant women and 2 drinks a day for men. One drink equals 12 oz of beer, 5 oz of wine, or 1 oz of hard liquor. Lifestyle  Work with your health care provider to maintain a healthy body weight, or to lose weight. Ask what an ideal weight is for you.  Get at  least 30 minutes of exercise that causes your heart to beat faster (aerobic exercise) most days of the week. Activities may include walking, swimming, or biking.  Include exercise to strengthen your muscles (resistance exercise), such as weight lifting, as part of your weekly exercise routine. Try to do these types of exercises for 30 minutes at least  3 days a week.  Do not use any products that contain nicotine or tobacco, such as cigarettes and e-cigarettes. If you need help quitting, ask your health care provider.  Control any long-term (chronic) conditions you have, such as high cholesterol or diabetes. Monitoring  Monitor your blood pressure at home as told by your health care provider. Your personal target blood pressure may vary depending on your medical conditions, your age, and other factors.  Have your blood pressure checked regularly, as often as told by your health care provider. Working with your health care provider  Review all the medicines you take with your health care provider because there may be side effects or interactions.  Talk with your health care provider about your diet, exercise habits, and other lifestyle factors that may be contributing to hypertension.  Visit your health care provider regularly. Your health care provider can help you create and adjust your plan for managing hypertension. Will I need medicine to control my blood pressure? Your health care provider may prescribe medicine if lifestyle changes are not enough to get your blood pressure under control, and if:  Your systolic blood pressure is 130 or higher.  Your diastolic blood pressure is 80 or higher.  Take medicines only as told by your health care provider. Follow the directions carefully. Blood pressure medicines must be taken as prescribed. The medicine does not work as well when you skip doses. Skipping doses also puts you at risk for problems. Contact a health care provider if:  You think you are having a reaction to medicines you have taken.  You have repeated (recurrent) headaches.  You feel dizzy.  You have swelling in your ankles.  You have trouble with your vision. Get help right away if:  You develop a severe headache or confusion.  You have unusual weakness or numbness, or you feel faint.  You have severe pain in  your chest or abdomen.  You vomit repeatedly.  You have trouble breathing. Summary  Hypertension is when the force of blood pumping through your arteries is too strong. If this condition is not controlled, it may put you at risk for serious complications.  Your personal target blood pressure may vary depending on your medical conditions, your age, and other factors. For most people, a normal blood pressure is less than 120/80.  Hypertension is managed by lifestyle changes, medicines, or both. Lifestyle changes include weight loss, eating a healthy, low-sodium diet, exercising more, and limiting alcohol. This information is not intended to replace advice given to you by your health care provider. Make sure you discuss any questions you have with your health care provider. Document Released: 11/19/2011 Document Revised: 01/23/2016 Document Reviewed: 01/23/2016 Elsevier Interactive Patient Education  2018 [employer]     IF you received an x-ray today, you will receive an invoice from [facility]. Please contact [facility] at [phone] with questions or concerns regarding your invoice.   IF you received labwork today, you will receive an invoice from [facility]. Please contact [facility] at [phone] with questions or concerns regarding your invoice.   Our billing staff will not be able to assist you  with questions regarding bills from these companies.  You will be contacted with the lab results as soon as they are available. The fastest way to get your results is to activate your My Chart account. Instructions are located on the last page of this paperwork. If you have not heard from [place] regarding the results in 2 weeks, please contact this office.

## 2017-10-15 NOTE — Progress Notes (Signed)
    MRN: [id number] DOB: 1965-10-19  Subjective:   Frank Silva is a 52 y.o. male presenting for follow up on Hypertension. Currently managed with diltiazem and hydrochlorothiazide.  Avoids salt in diet, and is staying active.  Patient reports that he has a girlfriend now and feels really good about where he is in life, starting a new job that will pay much better and give him better hours.  He is also requesting refills of his Xarelto and albuterol.  He has not had any issues with these medications.  He is still smoking cigarettes but is working on cutting back.  He is not interested in starting any smoking cessation medications today.  Denies dizziness, chronic headache, blurred vision, chest pain, shortness of breath, heart racing, palpitations, nausea, vomiting, abdominal pain, hematuria, lower leg swelling.   Frank Silva has a current medication list which includes the following prescription(s): albuterol, diltiazem, hydrochlorothiazide, and rivaroxaban. Also is allergic to flexeril [cyclobenzaprine]; peanut-containing drug products; grapefruit bioflavonoid complex; other; and prednisone.  Frank Silva  has a past medical history of A-fib [facility](HCC), Allergy, Atrial fibrillation (HCC), Neuromuscular disorder (HCC), and Stroke (HCC). Also  has a past surgical history that includes Cardiac catheterization.  Objective:   Vitals: BP (!) 141/81   Pulse 80   Temp 98.6 F (37 C) (Oral)   Resp 18   Ht 5\' 8"  (1.727 m)   Wt 158 lb 12.8 oz (72 kg)   SpO2 96%   BMI 24.15 kg/m   BP Readings from Last 3 Encounters:  10/15/17 (!) 141/81  08/28/17 (!) 144/82  02/26/17 130/90    Physical Exam  Constitutional: He is oriented to person, place, and time. He appears well-developed and well-nourished.  HENT:  Mouth/Throat: Oropharynx is clear and moist.  Eyes: Right eye exhibits no discharge. Left eye exhibits no discharge. No scleral icterus.  Neck: Normal range of motion. Neck supple. No thyromegaly present.    Cardiovascular: Normal rate, regular rhythm, normal heart sounds and intact distal pulses. Exam reveals no gallop and no friction rub.  No murmur heard. Pulmonary/Chest: Effort normal and breath sounds normal. No stridor. No respiratory distress. He has no wheezes. He has no rales.  Abdominal: Soft. Bowel sounds are normal. He exhibits no distension and no mass. There is no tenderness. There is no rebound and no guarding.  Musculoskeletal: He exhibits no edema.  Neurological: He is alert and oriented to person, place, and time.  Skin: Skin is warm and dry.  Psychiatric: He has a normal mood and affect.   Assessment and Plan :   Paroxysmal atrial fibrillation (HCC) - Plan: rivaroxaban (XARELTO) 20 MG TABS tablet, diltiazem (CARTIA XT) 120 MG 24 hr capsule  Colon cancer screening - Plan: Ambulatory referral to Gastroenterology  Elevated blood pressure reading - Plan: Lipid panel, Comprehensive metabolic panel  Essential hypertension  Screening for HIV without presence of risk factors - Plan: HIV antibody  Encounter for hepatitis C screening test for low risk patient - Plan: Hepatitis C antibody  Refilled all medications.  Patient is doing very well and very happy that his life is in a good place.  He is to follow-up in 6 months to 1 year.  Wallis BambergMario Jakaiden Fill, PA-C [facility] [phone] 10/15/2017  10:51 AM

## 2017-12-16 ENCOUNTER — Encounter: Payer: Self-pay | Admitting: Family Medicine

## 2018-01-01 ENCOUNTER — Ambulatory Visit: Payer: Self-pay

## 2018-01-01 ENCOUNTER — Encounter: Payer: Self-pay | Admitting: Physician Assistant

## 2018-01-01 ENCOUNTER — Ambulatory Visit: Payer: BLUE CROSS/BLUE SHIELD | Admitting: Family Medicine

## 2018-01-01 ENCOUNTER — Other Ambulatory Visit: Payer: Self-pay

## 2018-01-01 ENCOUNTER — Ambulatory Visit (INDEPENDENT_AMBULATORY_CARE_PROVIDER_SITE_OTHER): Payer: BLUE CROSS/BLUE SHIELD | Admitting: Physician Assistant

## 2018-01-01 VITALS — BP 130/83 | HR 87 | Temp 98.7°F | Resp 20 | Ht 68.58 in | Wt 161.2 lb

## 2018-01-01 DIAGNOSIS — R0989 Other specified symptoms and signs involving the circulatory and respiratory systems: Secondary | ICD-10-CM

## 2018-01-01 NOTE — Progress Notes (Signed)
Frank Silva  MRN: [id number] DOB: 02/05/1966  Subjective:  Frank Silva is a 52 y.o. male with PMH of a.fib, stroke, and HTN seen in office today for a chief complaint of purplish/blue discoloration in BLE x several months at least. Left toe has been "more red" for past day, noticed this in shower.  Pt is  Concerned about his circulation.  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   No injury to area.  Denies pain, pallor, poikilothermia, paresthesias.  No calf pain when walking, no rest pain/claudication.  No recent illnesses.  No fevers/chills/weight loss.  Does have residual numbness and some left sided weakness s/p stroke in 2014, which has not changed. Smokes 1.5 ppd.  He is on xarelto as blood thinner 2/2 to prior stroke.  Of note, patient very poor historian.  Difficulty recalling much if any of his past medical history or time frame of chief complaint.   Review of Systems as above per HPI  Patient Active Problem List   Diagnosis Date Noted  . PAF (paroxysmal atrial fibrillation) (HCC) 05/22/2014  . 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 Facial droop   . Stroke (HCC)   . Acute CVA (cerebrovascular accident) (HCC) 04/30/2014  . Accelerated hypertension 04/30/2014  . History of Dysrhythmia, cardiac 04/30/2014    Current Outpatient Medications on File Prior to Visit  Medication Sig Dispense Refill  . albuterol (PROVENTIL HFA;VENTOLIN HFA) 108 (90 Base) MCG/ACT inhaler INHALE TWO PUFFS BY MOUTH EVERY 6 HOURS AS NEEDED FOR WHEEZING 18 g 3  . diltiazem (CARTIA XT) 120 MG 24 hr capsule Take 1 capsule (120 mg total) by mouth daily. 90 capsule 3  . hydrochlorothiazide (HYDRODIURIL) 12.5 MG tablet TAKE 1 TABLET BY MOUTH ONCE DAILY. 90 tablet 3  . predniSONE (DELTASONE) 20 MG tablet Take 1 tablet (20 mg total) by mouth daily with breakfast. 10 tablet 0  . rivaroxaban (XARELTO) 20 MG TABS tablet Take 1 tablet (20 mg total) by  mouth daily. 90 tablet 3   No current facility-administered medications on file prior to visit.     Allergies  Allergen Reactions  . Flexeril [Cyclobenzaprine] Other (See Comments)    Heart race  . Peanut-Containing Drug Products Hives and Shortness Of Breath  . Grapefruit Bioflavonoid Complex Other (See Comments)    Interaction with medication  . Other     Paprika  . Prednisone Other (See Comments)    Tremors      Social History   Socioeconomic History  . Marital status: Married    Spouse name: [name]  . Number of children: 3  . Years of education: 39  . Highest education level: Not on file  Occupational History  . Occupation: Unemployed  Social Needs  . Financial resource strain: Not on file  . Food insecurity:    Worry: Not on file    Inability: Not on file  . Transportation needs:    Medical: Not on file    Non-medical: Not on file  Tobacco Use  . Smoking status: Current Every Day Smoker    Packs/day: 1.50    Types: Cigarettes  . Smokeless tobacco: Never Used  Substance and Sexual Activity  . Alcohol use: No    Alcohol/week: 0.0 standard drinks  . Drug use: No  . Sexual activity: Yes  Lifestyle  . Physical activity:    Days per week: Not on file  Minutes per session: Not on file  . Stress: Not on file  Relationships  . Social connections:    Talks on phone: Not on file    Gets together: Not on file    Attends religious service: Not on file    Active member of club or organization: Not on file    Attends meetings of clubs or organizations: Not on file    Relationship status: Not on file  . Intimate partner violence:    Fear of current or ex partner: Not on file    Emotionally abused: Not on file    Physically abused: Not on file    Forced sexual activity: Not on file  Other Topics Concern  . Not on file  Social History Narrative   Lives at home with wife.   Caffeine use: Drink coke (5-6 glasses per day)    Objective:  BP 130/83   Pulse 87    Temp 98.7 F (37.1 C) (Oral)   Resp 20   Ht 5' 8.58" (1.742 m)   Wt 161 lb 3.2 oz (73.1 kg)   SpO2 98%   BMI 24.10 kg/m   Physical Exam  Constitutional: He is oriented to person, place, and time. He appears well-developed and well-nourished. No distress.  HENT:  Head: Normocephalic and atraumatic.  Mouth/Throat: Uvula is midline, oropharynx is clear and moist and mucous membranes are normal. No tonsillar exudate.  Eyes: Pupils are equal, round, and reactive to light. Conjunctivae and EOM are normal.  Neck: Normal range of motion.  Cardiovascular: Normal rate, regular rhythm and normal heart sounds.  Pulses:      Dorsalis pedis pulses are 2+ on the right side, and 1+ on the left side.       Posterior tibial pulses are 2+ on the right side, and 2+ on the left side.  Pulmonary/Chest: Effort normal and breath sounds normal. He has no decreased breath sounds. He has no wheezes. He has no rhonchi. He has no rales.  Coarse breath sounds b/l  Musculoskeletal:       Right lower leg: He exhibits no tenderness and no swelling.       Left lower leg: He exhibits no tenderness and no swelling.       Right foot: There is normal range of motion and normal capillary refill.       Left foot: There is normal range of motion and normal capillary refill.  Feet:  Right Foot:  Protective Sensation: 5 sites tested. 5 sites sensed.  Skin Integrity: Negative for ulcer, blister, skin breakdown, erythema, warmth or callus.  Left Foot:  Protective Sensation: 5 sites tested. 5 sites sensed.  Skin Integrity: Negative for ulcer, blister, skin breakdown, erythema, warmth or callus.  Neurological: He is alert and oriented to person, place, and time.  Vibratory and positional sense of b/l feet intact.    Skin: Skin is warm and dry. Nails show clubbing (2-4th toes with clubbing b/l).  Psychiatric: He has a normal mood and affect.  Vitals reviewed.         Assessment and Plan :  1. Abnormal peripheral  pulse - no red flags currently.  Very few symptoms -- yet also poor historian - plan is to send to vascular surgery for further eval and recommendations as he is long-term smoker and at risk for PAD. - I could not really appreciate any redness of his toes.  No physical exam findings concerning for ischemic limb - Ambulatory referral to Vascular  Surgery   Benjiman Core PA-C  [facility] at [facility] 01/01/2018 4:34 PM

## 2018-01-01 NOTE — Patient Instructions (Addendum)
Follow up with vein and vascular. If you ever have acute pain, loss of sensation, purple discoloration, or cold extremities, seek care immediately.   If you have lab work done today you will be contacted with your lab results within the next 2 weeks.  If you have not heard from [place] then please contact us. The fastest way to get your results is to register for My Chart.   Peripheral Vascular Disease Peripheral vascular disease (PVD) is a disease of the blood vessels that are not part of your heart and brain. A simple term for PVD is poor circulation. In most cases, PVD narrows the blood vessels that carry blood from your heart to the rest of your body. This can result in a decreased supply of blood to your arms, legs, and internal organs, like your stomach or kidneys. However, it most often affects a person's lower legs and feet. There are two types of PVD.  Organic PVD. This is the more common type. It is caused by damage to the structure of blood vessels.  Functional PVD. This is caused by conditions that make blood vessels contract and tighten (spasm).  Without treatment, PVD tends to get worse over time. PVD can also lead to acute ischemic limb. This is when an arm or limb suddenly has trouble getting enough blood. This is a medical emergency. Follow these instructions at home:  Take medicines only as told by your doctor.  Do not use any tobacco products, including cigarettes, chewing tobacco, or electronic cigarettes. If you need help quitting, ask your doctor.  Lose weight if you are overweight, and maintain a healthy weight as told by your doctor.  Eat a diet that is low in fat and cholesterol. If you need help, ask your doctor.  Exercise regularly. Ask your doctor for some good activities for you.  Take good care of your feet. ? Wear comfortable shoes that fit well. ? Check your feet often for any cuts or sores. Contact a doctor if:  You have cramps in your legs while  walking.  You have leg pain when you are at rest.  You have coldness in a leg or foot.  Your skin changes.  You are unable to get or have an erection (erectile dysfunction).  You have cuts or sores on your feet that are not healing. Get help right away if:  Your arm or leg turns cold and blue.  Your arms or legs become red, warm, swollen, painful, or numb.  You have chest pain or trouble breathing.  You suddenly have weakness in your face, arm, or leg.  You become very confused or you cannot speak.  You suddenly have a very bad headache.  You suddenly cannot see. This information is not intended to replace advice given to you by your health care provider. Make sure you discuss any questions you have with your health care provider. Document Released: 05/21/2009 Document Revised: 08/02/2015 Document Reviewed: 08/04/2013 Elsevier Interactive Patient Education  2017 [employer].  IF you received an x-ray today, you will receive an invoice from [facility]. Please contact [facility] at [phone] with questions or concerns regarding your invoice.   IF you received labwork today, you will receive an invoice from [place]. Please contact [facility] at [phone] with questions or concerns regarding your invoice.   Our billing staff will not be able to assist you with questions regarding bills from these companies.  You will be contacted with the lab results as soon as  they are available. The fastest way to get your results is to activate your My Chart account. Instructions are located on the last page of this paperwork. If you have not heard from [place] regarding the results in 2 weeks, please contact this office.

## 2018-01-01 NOTE — Telephone Encounter (Signed)
Pt called with numerous complaints. Pt was getting in the shower this am and noted that his left leg is cooler than the right. Right leg is warmer.  Color is normal per pt. Pt also complained that his bilateral great toes were initially pale then after he sat down on the commode  he noted his great toes turned purple. Pt stated that the purple went away but was concerned because they have never done this before.  Pt c/o lower left back pain with sciatica that pt stated was "acting up" this morning. Pt c/o bleeding hemorrhoids that has been an ongoing problem. Pt had an appointment that he canceled. Pt given appointment today at 4:20 pm with [place].  Reason for Disposition . [1] New-onset pallor AND [2] mild change AND [3]  present < 3 days       (Exception: brief pallor from being cold or emotional shock)  Answer Assessment - Initial Assessment Questions 1. COLOR of SKIN: "What best describes the color of the skin?"     Left leg: normal color per pt-  Bilateral great toes: looked white and then looked purple were back to normal color  2. CHANGES: "Is this a change?" If so, ask: "How much of a change?"     Yes has never had color change 3. PALLOR ELSEWHERE: "Has the color of the lips, gums or nailbeds also changed?     Initially with pallor to toe nails 4. ONSET: When did the paleness begin?     This morning 5. CAUSE: What do you think is causing the pale skin?     Pt does not know 6. BLEEDING: "Any recent blood loss?"     From hemorrhoids 7. OTHER SYMPTOMS: "Do you have any other symptoms?" (e.g., weakness, dizziness, passing out, melena)     Lower left back pain with sciatica 8. PREGNANCY: "Is there any chance you are pregnant?" "When was your last menstrual period?"     *No Answer*  Protocols used: PALE SKIN-A-AH

## 2018-01-04 ENCOUNTER — Other Ambulatory Visit: Payer: Self-pay

## 2018-01-04 DIAGNOSIS — R0989 Other specified symptoms and signs involving the circulatory and respiratory systems: Secondary | ICD-10-CM

## 2018-01-07 ENCOUNTER — Encounter: Payer: Self-pay | Admitting: Physician Assistant

## 2018-01-11 ENCOUNTER — Encounter: Payer: BLUE CROSS/BLUE SHIELD | Admitting: Vascular Surgery

## 2018-01-11 ENCOUNTER — Ambulatory Visit (HOSPITAL_COMMUNITY): Admission: RE | Admit: 2018-01-11 | Payer: BLUE CROSS/BLUE SHIELD | Source: Ambulatory Visit

## 2018-01-19 ENCOUNTER — Encounter: Payer: Self-pay | Admitting: Family Medicine

## 2018-01-19 ENCOUNTER — Ambulatory Visit (INDEPENDENT_AMBULATORY_CARE_PROVIDER_SITE_OTHER): Payer: BLUE CROSS/BLUE SHIELD | Admitting: Family Medicine

## 2018-01-19 VITALS — BP 128/80 | HR 76 | Ht 68.58 in | Wt 163.1 lb

## 2018-01-19 DIAGNOSIS — Z8673 Personal history of transient ischemic attack (TIA), and cerebral infarction without residual deficits: Secondary | ICD-10-CM | POA: Insufficient documentation

## 2018-01-19 DIAGNOSIS — R3121 Asymptomatic microscopic hematuria: Secondary | ICD-10-CM

## 2018-01-19 DIAGNOSIS — M25551 Pain in right hip: Secondary | ICD-10-CM

## 2018-01-19 DIAGNOSIS — Z720 Tobacco use: Secondary | ICD-10-CM | POA: Diagnosis not present

## 2018-01-19 DIAGNOSIS — Z1211 Encounter for screening for malignant neoplasm of colon: Secondary | ICD-10-CM | POA: Insufficient documentation

## 2018-01-19 DIAGNOSIS — Z0001 Encounter for general adult medical examination with abnormal findings: Secondary | ICD-10-CM | POA: Diagnosis not present

## 2018-01-19 DIAGNOSIS — J0100 Acute maxillary sinusitis, unspecified: Secondary | ICD-10-CM

## 2018-01-19 DIAGNOSIS — I739 Peripheral vascular disease, unspecified: Secondary | ICD-10-CM | POA: Diagnosis not present

## 2018-01-19 DIAGNOSIS — I480 Paroxysmal atrial fibrillation: Secondary | ICD-10-CM

## 2018-01-19 DIAGNOSIS — R1013 Epigastric pain: Secondary | ICD-10-CM | POA: Diagnosis not present

## 2018-01-19 DIAGNOSIS — R195 Other fecal abnormalities: Secondary | ICD-10-CM

## 2018-01-19 DIAGNOSIS — K1370 Unspecified lesions of oral mucosa: Secondary | ICD-10-CM

## 2018-01-19 LAB — URINALYSIS, ROUTINE W REFLEX MICROSCOPIC
Bilirubin Urine: NEGATIVE
Ketones, ur: NEGATIVE
Leukocytes, UA: NEGATIVE
Nitrite: NEGATIVE
Specific Gravity, Urine: 1.01 (ref 1.000–1.030)
Total Protein, Urine: NEGATIVE
Urine Glucose: NEGATIVE
Urobilinogen, UA: 0.2 (ref 0.0–1.0)
pH: 6.5 (ref 5.0–8.0)

## 2018-01-19 LAB — CBC
HCT: 50.1 % (ref 39.0–52.0)
Hemoglobin: 17.2 g/dL — ABNORMAL HIGH (ref 13.0–17.0)
MCHC: 34.4 g/dL (ref 30.0–36.0)
MCV: 94.5 fl (ref 78.0–100.0)
Platelets: 198 10*3/uL (ref 150.0–400.0)
RBC: 5.3 Mil/uL (ref 4.22–5.81)
RDW: 13.1 % (ref 11.5–15.5)
WBC: 7.1 10*3/uL (ref 4.0–10.5)

## 2018-01-19 LAB — COMPREHENSIVE METABOLIC PANEL
ALT: 19 U/L (ref 0–53)
AST: 16 U/L (ref 0–37)
Albumin: 4.4 g/dL (ref 3.5–5.2)
Alkaline Phosphatase: 62 U/L (ref 39–117)
BUN: 13 mg/dL (ref 6–23)
CO2: 32 mEq/L (ref 19–32)
Calcium: 9.6 mg/dL (ref 8.4–10.5)
Chloride: 100 mEq/L (ref 96–112)
Creatinine, Ser: 1.29 mg/dL (ref 0.40–1.50)
GFR: 61.57 mL/min (ref >60.00)
Glucose, Bld: 97 mg/dL (ref 70–99)
Potassium: 4.1 mEq/L (ref 3.5–5.1)
Sodium: 139 mEq/L (ref 135–145)
Total Bilirubin: 0.6 mg/dL (ref 0.2–1.2)
Total Protein: 6.7 g/dL (ref 6.0–8.3)

## 2018-01-19 LAB — LIPID PANEL
Cholesterol: 163 mg/dL (ref 0–200)
HDL: 43.3 mg/dL (ref >39.00)
LDL Cholesterol: 89 mg/dL (ref 0–99)
NonHDL: 119.6
Total CHOL/HDL Ratio: 4
Triglycerides: 155 mg/dL — ABNORMAL HIGH (ref 0.0–149.0)
VLDL: 31 mg/dL (ref 0.0–40.0)

## 2018-01-19 LAB — TSH: TSH: 0.75 u[IU]/mL (ref 0.35–4.50)

## 2018-01-19 LAB — PSA: PSA: 0.43 ng/mL (ref 0.10–4.00)

## 2018-01-19 LAB — AMYLASE: Amylase: 40 U/L (ref 27–131)

## 2018-01-19 MED ORDER — RIVAROXABAN 20 MG PO TABS: 20.0000 mg | ORAL_TABLET | Freq: Every day | ORAL | 3 refills | Status: DC

## 2018-01-19 MED ORDER — DILTIAZEM HCL ER COATED BEADS 180 MG PO CP24: 180.0000 mg | ORAL_CAPSULE | Freq: Every day | ORAL | 0 refills | Status: DC

## 2018-01-19 MED ORDER — AMOXICILLIN 500 MG PO CAPS: 500.0000 mg | ORAL_CAPSULE | Freq: Three times a day (TID) | ORAL | 0 refills | Status: DC

## 2018-01-19 NOTE — Patient Instructions (Signed)
Atrial Fibrillation Atrial fibrillation is a type of irregular or rapid heartbeat (arrhythmia). In atrial fibrillation, the heart quivers continuously in a chaotic pattern. This occurs when parts of the heart receive disorganized signals that make the heart unable to pump blood normally. This can increase the risk for stroke, heart failure, and other heart-related conditions. There are different types of atrial fibrillation, including:  Paroxysmal atrial fibrillation. This type starts suddenly, and it usually stops on its own shortly after it starts.  Persistent atrial fibrillation. This type often lasts longer than a week. It may stop on its own or with treatment.  Long-lasting persistent atrial fibrillation. This type lasts longer than 12 months.  Permanent atrial fibrillation. This type does not go away.  Talk with your health care provider to learn about the type of atrial fibrillation that you have. What are the causes? This condition is caused by some heart-related conditions or procedures, including:  A heart attack.  Coronary artery disease.  Heart failure.  Heart valve conditions.  High blood pressure.  Inflammation of the sac that surrounds the heart (pericarditis).  Heart surgery.  Certain heart rhythm disorders, such as Wolf-Parkinson-White syndrome.  Other causes include:  Pneumonia.  Obstructive sleep apnea.  Blockage of an artery in the lungs (pulmonary embolism, or PE).  Lung cancer.  Chronic lung disease.  Thyroid problems, especially if the thyroid is overactive (hyperthyroidism).  Caffeine.  Excessive alcohol use or illegal drug use.  Use of some medicines, including certain decongestants and diet pills.  Sometimes, the cause cannot be found. What increases the risk? This condition is more likely to develop in:  People who are older in age.  People who smoke.  People who have diabetes mellitus.  People who are overweight  (obese).  Athletes who exercise vigorously.  What are the signs or symptoms? Symptoms of this condition include:  A feeling that your heart is beating rapidly or irregularly.  A feeling of discomfort or pain in your chest.  Shortness of breath.  Sudden light-headedness or weakness.  Getting tired easily during exercise.  In some cases, there are no symptoms. How is this diagnosed? Your health care provider may be able to detect atrial fibrillation when taking your pulse. If detected, this condition may be diagnosed with:  An electrocardiogram (ECG).  A Holter monitor test that records your heartbeat patterns over a 24-hour period.  Transthoracic echocardiogram (TTE) to evaluate how blood flows through your heart.  Transesophageal echocardiogram (TEE) to view more detailed images of your heart.  A stress test.  Imaging tests, such as a CT scan or chest X-ray.  Blood tests.  How is this treated? The main goals of treatment are to prevent blood clots from forming and to keep your heart beating at a normal rate and rhythm. The type of treatment that you receive depends on many factors, such as your underlying medical conditions and how you feel when you are experiencing atrial fibrillation. This condition may be treated with:  Medicine to slow down the heart rate, bring the heart's rhythm back to normal, or prevent clots from forming.  Electrical cardioversion. This is a procedure that resets your heart's rhythm by delivering a controlled, low-energy shock to the heart through your skin.  Different types of ablation, such as catheter ablation, catheter ablation with pacemaker, or surgical ablation. These procedures destroy the heart tissues that send abnormal signals. When the pacemaker is used, it is placed under your skin to help your heart beat in   a regular rhythm.  Follow these instructions at home:  Take over-the counter and prescription medicines only as told by your  health care provider.  If your health care provider prescribed a blood-thinning medicine (anticoagulant), take it exactly as told. Taking too much blood-thinning medicine can cause bleeding. If you do not take enough blood-thinning medicine, you will not have the protection that you need against stroke and other problems.  Do not use tobacco products, including cigarettes, chewing tobacco, and e-cigarettes. If you need help quitting, ask your health care provider.  If you have obstructive sleep apnea, manage your condition as told by your health care provider.  Do not drink alcohol.  Do not drink beverages that contain caffeine, such as coffee, soda, and tea.  Maintain a healthy weight. Do not use diet pills unless your health care provider approves. Diet pills may make heart problems worse.  Follow diet instructions as told by your health care provider.  Exercise regularly as told by your health care provider.  Keep all follow-up visits as told by your health care provider. This is important. How is this prevented?  Avoid drinking beverages that contain caffeine or alcohol.  Avoid certain medicines, especially medicines that are used for breathing problems.  Avoid certain herbs and herbal medicines, such as those that contain ephedra or ginseng.  Do not use illegal drugs, such as cocaine and amphetamines.  Do not smoke.  Manage your high blood pressure. Contact a health care provider if:  You notice a change in the rate, rhythm, or strength of your heartbeat.  You are taking an anticoagulant and you notice increased bruising.  You tire more easily when you exercise or exert yourself. Get help right away if:  You have chest pain, abdominal pain, sweating, or weakness.  You feel nauseous.  You notice blood in your vomit, bowel movement, or urine.  You have shortness of breath.  You suddenly have swollen feet and ankles.  You feel dizzy.  You have sudden weakness or  numbness of the face, arm, or leg, especially on one side of the body.  You have trouble speaking, trouble understanding, or both (aphasia).  Your face or your eyelid droops on one side. These symptoms may represent a serious problem that is an emergency. Do not wait to see if the symptoms will go away. Get medical help right away. Call your local emergency services (911 in the [place] Do not drive yourself to the hospital. This information is not intended to replace advice given to you by your health care provider. Make sure you discuss any questions you have with your health care provider. Document Released: 02/24/2005 Document Revised: 07/04/2015 Document Reviewed: 06/21/2014 Elsevier Interactive Patient Education  2018 [employer].  Health Maintenance, Male A healthy lifestyle and preventive care is important for your health and wellness. Ask your health care provider about what schedule of regular examinations is right for you. What should I know about weight and diet? Eat a Healthy Diet  Eat plenty of vegetables, fruits, whole grains, low-fat dairy products, and lean protein.  Do not eat a lot of foods high in solid fats, added sugars, or salt.  Maintain a Healthy Weight Regular exercise can help you achieve or maintain a healthy weight. You should:  Do at least 150 minutes of exercise each week. The exercise should increase your heart rate and make you sweat (moderate-intensity exercise).  Do strength-training exercises at least twice a week.  Watch Your Levels of Cholesterol and  Blood Lipids  Have your blood tested for lipids and cholesterol every 5 years starting at 54 years of age. If you are at high risk for heart disease, you should start having your blood tested when you are 54 years old. You may need to have your cholesterol levels checked more often if: ? Your lipid or cholesterol levels are high. ? You are older than 54 years of age. ? You are at high risk for heart  disease.  What should I know about cancer screening? Many types of cancers can be detected early and may often be prevented. Lung Cancer  You should be screened every year for lung cancer if: ? You are a current smoker who has smoked for at least 30 years. ? You are a former smoker who has quit within the past 15 years.  Talk to your health care provider about your screening options, when you should start screening, and how often you should be screened.  Colorectal Cancer  Routine colorectal cancer screening usually begins at 54 years of age and should be repeated every 5-10 years until you are 54 years old. You may need to be screened more often if early forms of precancerous polyps or small growths are found. Your health care provider may recommend screening at an earlier age if you have risk factors for colon cancer.  Your health care provider may recommend using home test kits to check for hidden blood in the stool.  A small camera at the end of a tube can be used to examine your colon (sigmoidoscopy or colonoscopy). This checks for the earliest forms of colorectal cancer.  Prostate and Testicular Cancer  Depending on your age and overall health, your health care provider may do certain tests to screen for prostate and testicular cancer.  Talk to your health care provider about any symptoms or concerns you have about testicular or prostate cancer.  Skin Cancer  Check your skin from head to toe regularly.  Tell your health care provider about any new moles or changes in moles, especially if: ? There is a change in a mole's size, shape, or color. ? You have a mole that is larger than a pencil eraser.  Always use sunscreen. Apply sunscreen liberally and repeat throughout the day.  Protect yourself by wearing long sleeves, pants, a wide-brimmed hat, and sunglasses when outside.  What should I know about heart disease, diabetes, and high blood pressure?  If you are 18-39 years  of age, have your blood pressure checked every 3-5 years. If you are 61 years of age or older, have your blood pressure checked every year. You should have your blood pressure measured twice-once when you are at a hospital or clinic, and once when you are not at a hospital or clinic. Record the average of the two measurements. To check your blood pressure when you are not at a hospital or clinic, you can use: ? An automated blood pressure machine at a pharmacy. ? A home blood pressure monitor.  Talk to your health care provider about your target blood pressure.  If you are between 67-35 years old, ask your health care provider if you should take aspirin to prevent heart disease.  Have regular diabetes screenings by checking your fasting blood sugar level. ? If you are at a normal weight and have a low risk for diabetes, have this test once every three years after the age of 37. ? If you are overweight and have a high  risk for diabetes, consider being tested at a younger age or more often.  A one-time screening for abdominal aortic aneurysm (AAA) by ultrasound is recommended for men aged 65-75 years who are current or former smokers. What should I know about preventing infection? Hepatitis B If you have a higher risk for hepatitis B, you should be screened for this virus. Talk with your health care provider to find out if you are at risk for hepatitis B infection. Hepatitis C Blood testing is recommended for:  Everyone born from [birth year] through 1967.  Anyone with known risk factors for hepatitis C.  Sexually Transmitted Diseases (STDs)  You should be screened each year for STDs including gonorrhea and chlamydia if: ? You are sexually active and are younger than 54 years of age. ? You are older than 54 years of age and your health care provider tells you that you are at risk for this type of infection. ? Your sexual activity has changed since you were last screened and you are at an increased  risk for chlamydia or gonorrhea. Ask your health care provider if you are at risk.  Talk with your health care provider about whether you are at high risk of being infected with HIV. Your health care provider may recommend a prescription medicine to help prevent HIV infection.  What else can I do?  Schedule regular health, dental, and eye exams.  Stay current with your vaccines (immunizations).  Do not use any tobacco products, such as cigarettes, chewing tobacco, and e-cigarettes. If you need help quitting, ask your health care provider.  Limit alcohol intake to no more than 2 drinks per day. One drink equals 12 ounces of beer, 5 ounces of wine, or 1 ounces of hard liquor.  Do not use street drugs.  Do not share needles.  Ask your health care provider for help if you need support or information about quitting drugs.  Tell your health care provider if you often feel depressed.  Tell your health care provider if you have ever been abused or do not feel safe at home. This information is not intended to replace advice given to you by your health care provider. Make sure you discuss any questions you have with your health care provider. Document Released: 08/23/2007 Document Revised: 10/24/2015 Document Reviewed: 11/28/2014 Elsevier Interactive Patient Education  2018 [employer].  Coping with Quitting Smoking Quitting smoking is a physical and mental challenge. You will face cravings, withdrawal symptoms, and temptation. Before quitting, work with your health care provider to make a plan that can help you cope. Preparation can help you quit and keep you from giving in. How can I cope with cravings? Cravings usually last for 5-10 minutes. If you get through it, the craving will pass. Consider taking the following actions to help you cope with cravings:  Keep your mouth busy: ? Chew sugar-free gum. ? Suck on hard candies or a straw. ? Brush your teeth.  Keep your hands and body  busy: ? Immediately change to a different activity when you feel a craving. ? Squeeze or play with a ball. ? Do an activity or a hobby, like making bead jewelry, practicing needlepoint, or working with wood. ? Mix up your normal routine. ? Take a short exercise break. Go for a quick walk or run up and down stairs. ? Spend time in public places where smoking is not allowed.  Focus on doing something kind or helpful for someone else.  Call a friend  or family member to talk during a craving.  Join a support group.  Call a quit line, such as 1-800-QUIT-NOW.  Talk with your health care provider about medicines that might help you cope with cravings and make quitting easier for you.  How can I deal with withdrawal symptoms? Your body may experience negative effects as it tries to get used to not having nicotine in the system. These effects are called withdrawal symptoms. They may include:  Feeling hungrier than normal.  Trouble concentrating.  Irritability.  Trouble sleeping.  Feeling depressed.  Restlessness and agitation.  Craving a cigarette.  To manage withdrawal symptoms:  Avoid places, people, and activities that trigger your cravings.  Remember why you want to quit.  Get plenty of sleep.  Avoid coffee and other caffeinated drinks. These may worsen some of your symptoms.  How can I handle social situations? Social situations can be difficult when you are quitting smoking, especially in the first few weeks. To manage this, you can:  Avoid parties, bars, and other social situations where people might be smoking.  Avoid alcohol.  Leave right away if you have the urge to smoke.  Explain to your family and friends that you are quitting smoking. Ask for understanding and support.  Plan activities with friends or family where smoking is not an option.  What are some ways I can cope with stress? Wanting to smoke may cause stress, and stress can make you want to  smoke. Find ways to manage your stress. Relaxation techniques can help. For example:  Breathe slowly and deeply, in through your nose and out through your mouth.  Listen to soothing, relaxing music.  Talk with a family member or friend about your stress.  Light a candle.  Soak in a bath or take a shower.  Think about a peaceful place.  What are some ways I can prevent weight gain? Be aware that many people gain weight after they quit smoking. However, not everyone does. To keep from gaining weight, have a plan in place before you quit and stick to the plan after you quit. Your plan should include:  Having healthy snacks. When you have a craving, it may help to: ? Eat plain popcorn, crunchy carrots, celery, or other cut vegetables. ? Chew sugar-free gum.  Changing how you eat: ? Eat small portion sizes at meals. ? Eat 4-6 small meals throughout the day instead of 1-2 large meals a day. ? Be mindful when you eat. Do not watch television or do other things that might distract you as you eat.  Exercising regularly: ? Make time to exercise each day. If you do not have time for a long workout, do short bouts of exercise for 5-10 minutes several times a day. ? Do some form of strengthening exercise, like weight lifting, and some form of aerobic exercise, like running or swimming.  Drinking plenty of water or other low-calorie or no-calorie drinks. Drink 6-8 glasses of water daily, or as much as instructed by your health care provider.  Summary  Quitting smoking is a physical and mental challenge. You will face cravings, withdrawal symptoms, and temptation to smoke again. Preparation can help you as you go through these challenges.  You can cope with cravings by keeping your mouth busy (such as by chewing gum), keeping your body and hands busy, and making calls to family, friends, or a helpline for people who want to quit smoking.  You can cope with withdrawal symptoms by avoiding  places where people smoke, avoiding drinks with caffeine, and getting plenty of rest.  Ask your health care provider about the different ways to prevent weight gain, avoid stress, and handle social situations. This information is not intended to replace advice given to you by your health care provider. Make sure you discuss any questions you have with your health care provider. Document Released: 02/22/2016 Document Revised: 02/22/2016 Document Reviewed: 02/22/2016 Elsevier Interactive Patient Education  [employer]2018 [employer]

## 2018-01-19 NOTE — Progress Notes (Addendum)
Subjective:  Patient ID: Frank Silva, male    DOB: 11-Jan-1966  Age: 52 y.o. MRN: [id number]  CC: Establish Care   HPI Frank Silva presents for establishment of care and follow-up for multiple medical issues for this patient.  Patient has been having a 10-day history of sinus issues with nasal congestion postnasal drip and now with purulent rhinorrhea and facial pressure.  There is been no fevers chills nausea or vomiting.  He has a significant past medical history of paroxysmal atrial fibrillation.  His rate is been controlled with Cartia 120 mg.  History of hypertension controlled with Cartia and low-dose HCTZ.  He has a history of CVA that is believed to be associated with his PAF.  He feels as though the HCTZ may be contributing to his issues with the ED.  He says that there is some decrease in the force of his urine stream but otherwise he denies nocturia pre-or post void dribbling.  Long-standing history of tobacco use.  He has decreased the amount that he is smoking and hopes to get off of cigarettes altogether.  He uses an inhaler for intermittent shortness of breath.  He was told that he has the early stages of COPD.  He is aware that bronchodilators can irritate his heart.  He has a lesion inside his mouth that is been there for a couple of years and he would like for me to take a look at.  He is concerned about the blood flow in the arteries of his neck.  He was told at one point that he has plaques in those arteries.  He is concerned about the blood flow into his legs.  Denies specific claudication pains tells that his toes turn blue on occasion.  He has ongoing pain involving his right hip.  He deals with some epigastric pain but denies ongoing reflux or burping.  He takes Protonix as needed.  He has never had a colonoscopy.  He does on occasion see bleeding with his stooling.  He attributes this to his history of hemorrhoids.  He works as a Naval architect.  He does smoke.  He does not  drink alcohol or use illicit drugs.  Recent stress.  His wife left him for his best friend.  They are now married.  He has a significant other himself he is planning on marrying  Outpatient Medications Prior to Visit  Medication Sig Dispense Refill  . albuterol (PROVENTIL HFA;VENTOLIN HFA) 108 (90 Base) MCG/ACT inhaler INHALE TWO PUFFS BY MOUTH EVERY 6 HOURS AS NEEDED FOR WHEEZING 18 g 3  . diltiazem (CARTIA XT) 120 MG 24 hr capsule Take 1 capsule (120 mg total) by mouth daily. 90 capsule 3  . hydrochlorothiazide (HYDRODIURIL) 12.5 MG tablet TAKE 1 TABLET BY MOUTH ONCE DAILY. 90 tablet 3  . predniSONE (DELTASONE) 20 MG tablet Take 1 tablet (20 mg total) by mouth daily with breakfast. 10 tablet 0  . rivaroxaban (XARELTO) 20 MG TABS tablet Take 1 tablet (20 mg total) by mouth daily. 90 tablet 3   No facility-administered medications prior to visit.     ROS Review of Systems  Constitutional: Negative for chills, diaphoresis, fatigue, fever and unexpected weight change.  HENT: Positive for congestion, postnasal drip, rhinorrhea and sinus pain.   Eyes: Negative for photophobia and visual disturbance.  Respiratory: Positive for cough and wheezing. Negative for shortness of breath.   Cardiovascular: Negative for chest pain, palpitations and leg swelling.  Gastrointestinal: Positive for abdominal  pain and blood in stool. Negative for constipation, nausea and vomiting.  Endocrine: Negative for polyphagia and polyuria.  Genitourinary: Positive for difficulty urinating. Negative for frequency, hematuria and urgency.  Musculoskeletal: Positive for arthralgias. Negative for gait problem and joint swelling.  Skin: Negative for pallor.  Allergic/Immunologic: Negative for immunocompromised state.  Neurological: Negative for light-headedness, numbness and headaches.  Hematological: Negative for adenopathy.  Psychiatric/Behavioral: Negative.     Objective:  BP 128/80 (BP Location: Left Arm, Patient  Position: Sitting, Cuff Size: Normal)   Pulse 76   Ht 5' 8.58" (1.742 m)   Wt 163 lb 2 oz (74 kg)   SpO2 96%   BMI 24.39 kg/m   BP Readings from Last 3 Encounters:  01/19/18 128/80  01/01/18 130/83  10/15/17 (!) 141/81    Wt Readings from Last 3 Encounters:  01/19/18 163 lb 2 oz (74 kg)  01/01/18 161 lb 3.2 oz (73.1 kg)  10/15/17 158 lb 12.8 oz (72 kg)    Physical Exam  Constitutional: He is oriented to person, place, and time. He appears well-developed and well-nourished. No distress.  HENT:  Head: Normocephalic and atraumatic.  Right Ear: External ear normal.  Left Ear: External ear normal.  Mouth/Throat:    Eyes: Pupils are equal, round, and reactive to light. Conjunctivae and EOM are normal. Right eye exhibits no discharge. Left eye exhibits no discharge. No scleral icterus.  Neck: Neck supple. No JVD present. No tracheal deviation present. No thyromegaly present.  Cardiovascular: Normal rate, regular rhythm and normal heart sounds.  No extrasystoles are present. Exam reveals decreased pulses.  Pulses:      Carotid pulses are 2+ on the right side, and 1+ on the left side.      Dorsalis pedis pulses are 1+ on the right side, and 1+ on the left side.       Posterior tibial pulses are 1+ on the right side, and 1+ on the left side.  Abdominal: Soft. Bowel sounds are normal.  Genitourinary: Rectal exam shows guaiac positive stool. Rectal exam shows no external hemorrhoid, no internal hemorrhoid, no fissure, no mass, no tenderness and anal tone normal. Prostate is enlarged. Prostate is not tender.  Lymphadenopathy:    He has no cervical adenopathy.  Neurological: He is alert and oriented to person, place, and time.  Skin: Skin is warm and dry. He is not diaphoretic.    Lab Results  Component Value Date   WBC 7.1 01/19/2018   HGB 17.2 (H) 01/19/2018   HCT 50.1 01/19/2018   PLT 198.0 01/19/2018   GLUCOSE 97 01/19/2018   CHOL 163 01/19/2018   TRIG 155.0 (H) 01/19/2018     HDL 43.30 01/19/2018   LDLCALC 89 01/19/2018   ALT 19 01/19/2018   AST 16 01/19/2018   NA 139 01/19/2018   K 4.1 01/19/2018   CL 100 01/19/2018   CREATININE 1.29 01/19/2018   BUN 13 01/19/2018   CO2 32 01/19/2018   TSH 0.75 01/19/2018   PSA 0.43 01/19/2018   INR 1.1 12/11/2015    Dg Chest 2 View  Result Date: 02/17/2017 CLINICAL DATA:  Shortness of breath and pain EXAM: CHEST  2 VIEW COMPARISON:  May 26, 2016 FINDINGS: There is slight atelectasis in the left base. The lungs elsewhere clear. Heart size and pulmonary vascularity are normal. No adenopathy. No bone lesions. No pneumothorax. IMPRESSION: Slight left base atelectasis.  No edema or consolidation. Electronically Signed   By: Bretta BangWilliam  Woodruff III M.D.  On: 02/17/2017 14:45   The ASCVD Risk score Denman George DC Jr., et al., 2013) failed to calculate for the following reasons:   The patient has a prior MI or stroke diagnosis Assessment & Plan:   Zamari was seen today for establish care.  Diagnoses and all orders for this visit:  PAF (paroxysmal atrial fibrillation) (HCC) -     diltiazem (CARDIZEM CD) 180 MG 24 hr capsule; Take 1 capsule (180 mg total) by mouth daily. -     CBC -     Comprehensive metabolic panel -     Lipid panel -     TSH -     rivaroxaban (XARELTO) 20 MG TABS tablet; Take 1 tablet (20 mg total) by mouth daily.  PVD (peripheral vascular disease) (HCC) -     Comprehensive metabolic panel -     Lipid panel -     VAS US CAROTID; Future -     VAS [place] LOWER EXTREMITY ARTERIAL DUPLEX; Future  Tobacco abuse  Acute maxillary sinusitis, recurrence not specified -     amoxicillin (AMOXIL) 500 MG capsule; Take 1 capsule (500 mg total) by mouth 3 (three) times daily.  Oral lesion -     Ambulatory referral to ENT  Pain of right hip joint -     Ambulatory referral to Sports Medicine  Encounter for health maintenance examination with abnormal findings -     PSA -     Urinalysis, Routine w reflex  microscopic  Epigastric pain -     CBC -     Comprehensive metabolic panel -     Amylase -     Ambulatory referral to Gastroenterology  Screen for colon cancer -     Ambulatory referral to Gastroenterology  Heme positive stool -     Ambulatory referral to Gastroenterology  History of CVA (cerebrovascular accident) -     diltiazem (CARDIZEM CD) 180 MG 24 hr capsule; Take 1 capsule (180 mg total) by mouth daily. -     CBC -     Comprehensive metabolic panel -     Lipid panel -     VAS US CAROTID; Future  Asymptomatic microscopic hematuria   I have discontinued Remi Deter R. Mcdade's diltiazem, hydrochlorothiazide, and predniSONE. I am also having him start on diltiazem and amoxicillin. Additionally, I am having him maintain his albuterol and rivaroxaban.  Meds ordered this encounter  Medications  . diltiazem (CARDIZEM CD) 180 MG 24 hr capsule    Sig: Take 1 capsule (180 mg total) by mouth daily.    Dispense:  90 capsule    Refill:  0  . amoxicillin (AMOXIL) 500 MG capsule    Sig: Take 1 capsule (500 mg total) by mouth 3 (three) times daily.    Dispense:  30 capsule    Refill:  0  . rivaroxaban (XARELTO) 20 MG TABS tablet    Sig: Take 1 tablet (20 mg total) by mouth daily.    Dispense:  90 tablet    Refill:  3   Patient refuses flu vaccine.  He said that he was given a pneumonia vaccine when he was under the weather and feels as though he reacted to it.  Strongly advised him to discontinue smoking.  He was given anticipatory guidance on coping with quitting smoking.  He will continue to use albuterol as needed basis.  Chest x-ray obtained last year was reviewed that did show atelectasis.  Have discontinued HCTZ and increased his dose  of Cardizem from 1 20-1 80.  Referred to GI for colonoscopy, EGD and possible work-up for epigastric pain.  In obtaining the family CBC CMP for this today.  Sounds is low though he is having mild LUTS symptoms.  We will follow these for now.  Patient  was given anticipatory guidance on health maintenance and disease prevention.  Follow-up: Return in about 5 weeks (around 02/23/2018).  Mliss Sax, MD

## 2018-01-19 NOTE — Addendum Note (Signed)
Addended by: Andrez GrimeKREMER, Lyda Colcord A on: 01/19/2018 10:37 AM   Modules accepted: Level of Service

## 2018-01-23 ENCOUNTER — Ambulatory Visit: Payer: BLUE CROSS/BLUE SHIELD | Admitting: Family Medicine

## 2018-01-27 ENCOUNTER — Ambulatory Visit (HOSPITAL_BASED_OUTPATIENT_CLINIC_OR_DEPARTMENT_OTHER)
Admission: RE | Admit: 2018-01-27 | Discharge: 2018-01-27 | Disposition: A | Discharge status: Home / Self Care | Payer: BLUE CROSS/BLUE SHIELD | Source: Ambulatory Visit | Attending: Family Medicine | Admitting: Family Medicine

## 2018-01-27 DIAGNOSIS — Z8673 Personal history of transient ischemic attack (TIA), and cerebral infarction without residual deficits: Secondary | ICD-10-CM | POA: Insufficient documentation

## 2018-01-27 DIAGNOSIS — I739 Peripheral vascular disease, unspecified: Secondary | ICD-10-CM | POA: Diagnosis not present

## 2018-01-27 NOTE — Progress Notes (Signed)
Carotid Utrasound Doppler    Right Carotid:Velocities in the right ICA are consistent with a 1-39% stenosis.  Left Carotid: Velocities in the left ICA are consistent with a 1-39% stenosis.    Vertebrals: Bilateral vertebral arteries demonstrate antegrade flow.    Subclavians: Normal flow hemodynamics were seen in bilateral subclavian arteries.      01/27/18 Sinda DuJoseph Mekiah Cambridge RDCS, RVT

## 2018-01-27 NOTE — Progress Notes (Signed)
Lower Extremity Arterial duplex performed   Right: No evdience of Stensis at right EIA, CFA, SFA,POP A, PTA, ATA.   Left: Mild plaque visualized at left CFA.  No evidence of stenosis at left EIA, CFA, SFA, POP A , PTA, ATA.    01/27/18 Sinda DuJoseph Jefrey Raburn RDCS, RVT

## 2018-02-03 ENCOUNTER — Ambulatory Visit: Payer: BLUE CROSS/BLUE SHIELD | Admitting: Family Medicine

## 2018-02-03 ENCOUNTER — Ambulatory Visit: Payer: Self-pay

## 2018-02-03 ENCOUNTER — Encounter: Payer: Self-pay | Admitting: Family Medicine

## 2018-02-03 VITALS — BP 142/80 | HR 75 | Ht 68.58 in | Wt 163.0 lb

## 2018-02-03 DIAGNOSIS — R319 Hematuria, unspecified: Secondary | ICD-10-CM

## 2018-02-03 DIAGNOSIS — I480 Paroxysmal atrial fibrillation: Secondary | ICD-10-CM

## 2018-02-03 DIAGNOSIS — I10 Essential (primary) hypertension: Secondary | ICD-10-CM

## 2018-02-03 DIAGNOSIS — I739 Peripheral vascular disease, unspecified: Secondary | ICD-10-CM | POA: Diagnosis not present

## 2018-02-03 MED ORDER — TRANDOLAPRIL 1 MG PO TABS: 1.0000 mg | ORAL_TABLET | Freq: Every day | ORAL | 0 refills | Status: DC

## 2018-02-03 MED ORDER — ATORVASTATIN CALCIUM 10 MG PO TABS: 10.0000 mg | ORAL_TABLET | Freq: Every day | ORAL | 3 refills | Status: DC

## 2018-02-03 NOTE — Patient Instructions (Signed)
Trandolapril tablets What is this medicine? TRANDOLAPRIL (tran DOLE a pril) is an ACE inhibitor. This medicine is used to treat high blood pressure. It is also used to treat patients who have heart failure following a heart attack. This medicine may be used for other purposes; ask your health care provider or pharmacist if you have questions. COMMON BRAND NAME(S): Mavik What should I tell my health care provider before I take this medicine? They need to know if you have any of these conditions: -bone marrow disease -heart or blood vessel disease -if you are on a special diet, such as a low salt diet -immune system disease like lupus -kidney or liver disease -low blood pressure -previous swelling of the tongue, face, or lips with difficulty breathing, difficulty swallowing, hoarseness, or tightening of the throat -an unusual or allergic reaction to trandolapril, other ACE inhibitors, insect venom, foods, dyes, or preservatives -pregnant or trying to get pregnant -breast-feeding How should I use this medicine? Take this medicine by mouth with a glass of water. Follow the directions on the prescription label. Take your doses at regular intervals. Do not take your medicine more often than directed. Do not stop taking this medicine except on the advice of your doctor or health care professional. Talk to your pediatrician regarding the use of this medicine in children. Special care may be needed. Overdosage: If you think you have taken too much of this medicine contact a poison control center or emergency room at once. NOTE: This medicine is only for you. Do not share this medicine with others. What if I miss a dose? If you miss a dose, take it as soon as you can. If it is almost time for your next dose, take only that dose. Do not take double or extra doses. What may interact with this medicine? Do not take this medication with any of the following medications: -sacubitril; valsartan This  medicine may also interact with the following: -diuretics -lithium -medicines for high blood pressure -NSAIDs, medicines for pain and inflammation, like ibuprofen or naproxen -potassium salts or potassium supplements This list may not describe all possible interactions. Give your health care provider a list of all the medicines, herbs, non-prescription drugs, or dietary supplements you use. Also tell them if you smoke, drink alcohol, or use illegal drugs. Some items may interact with your medicine. What should I watch for while using this medicine? Visit your doctor or health care professional for regular checks on your progress. Check your blood pressure as directed. Ask your doctor or health care professional what your blood pressure should be and when you should contact him or her. Call your doctor or health care professional if you notice an irregular or fast heart beat. Women should inform their doctor if they wish to become pregnant or think they might be pregnant. There is a potential for serious side effects to an unborn child. Talk to your health care professional or pharmacist for more information. Check with your doctor or health care professional if you get an attack of severe diarrhea, nausea and vomiting, or if you sweat a lot. The loss of too much body fluid can make it dangerous for you to take this medicine. You may get drowsy or dizzy. Do not drive, use machinery, or do anything that needs mental alertness until you know how this drug affects you. Do not stand or sit up quickly, especially if you are an older patient. This reduces the risk of dizzy or fainting spells. Alcohol  can make you more drowsy and dizzy. Avoid alcoholic drinks. Avoid salt substitutes unless you are told otherwise by your doctor or health care professional. Do not treat yourself for coughs, colds, or pain while you are taking this medicine without asking your doctor or health care professional for advice. Some  ingredients may increase your blood pressure. What side effects may I notice from receiving this medicine? Side effects that you should report to your doctor or health care professional as soon as possible: -allergic reactions like skin rash or hives, swelling of the hands, feet, face, lips, throat, or tongue -chest pain -decreased amount of urine passed -difficulty breathing, or difficulty swallowing -dizziness, light headedness or fainting spells -fever or chills -numbness or tingling in your fingers or toes -nausea and vomiting -stomach or abdominal pain -swelling of your legs or ankles Side effects that usually do not require medical attention (report to your doctor or health care professional if they continue or are bothersome): -cough -decreased sexual function or desire -diarrhea -headache This list may not describe all possible side effects. Call your doctor for medical advice about side effects. You may report side effects to FDA at 1-800-FDA-1088. Where should I keep my medicine? Keep out of the reach of children. Store at room temperature between 20 and 25 degrees C (68 and 77 degrees F). Keep container tightly closed. Throw away any unused medicine after the expiration date. NOTE: This sheet is a summary. It may not cover all possible information. If you have questions about this medicine, talk to your doctor, pharmacist, or health care provider.  2018 Elsevier/Gold Standard (2015-04-20 40:98:1112:08:28)

## 2018-02-03 NOTE — Progress Notes (Signed)
Established Patient Office Visit  Subjective:  Patient ID: Frank Silva, male    DOB: January 07, 1966  Age: 52 y.o. MRN: [id number]  CC:  Chief Complaint  Patient presents with  . Hypertension    HPI Frank Silva presents for follow up of elevated blood pressure. His blood pressure yesteday was 174/105 and then he took hctz which had been discontinued. He rechecked his blood pressure and it was 170/100. This morning it was 145/100. He has been using an automatic wrist monitor to check his blood pressure.  Patient's blood pressure increased even after increasing Cardizem to 180 mg.  ED issues did resolve after discontinuation of HCTZ.  Patient's carotid and lower extremity arterial studies did show mild plaque.  Patient does have a history of CVA.  He does smoke.  Patient did experience a mild headache when his blood pressure was elevated  Past Medical History:  Diagnosis Date  . A-fib (HCC)   . Allergy   . Atrial fibrillation (HCC)   . Neuromuscular disorder (HCC)   . Stroke [facility](HCC)     Past Surgical History:  Procedure Laterality Date  . CARDIAC CATHETERIZATION      Family History  Problem Relation Age of Onset  . CVA Father     Social History   Socioeconomic History  . Marital status: Married    Spouse name: Misty StanleyLisa  . Number of children: 3  . Years of education: 5712  . Highest education level: Not on file  Occupational History  . Occupation: Unemployed  Social Needs  . Financial resource strain: Not on file  . Food insecurity:    Worry: Not on file    Inability: Not on file  . Transportation needs:    Medical: Not on file    Non-medical: Not on file  Tobacco Use  . Smoking status: Current Every Day Smoker    Packs/day: 1.50    Types: Cigarettes  . Smokeless tobacco: Never Used  Substance and Sexual Activity  . Alcohol use: No    Alcohol/week: 0.0 standard drinks  . Drug use: No  . Sexual activity: Yes  Lifestyle  . Physical activity:    Days per week:  Not on file    Minutes per session: Not on file  . Stress: Not on file  Relationships  . Social connections:    Talks on phone: Not on file    Gets together: Not on file    Attends religious service: Not on file    Active member of club or organization: Not on file    Attends meetings of clubs or organizations: Not on file    Relationship status: Not on file  . Intimate partner violence:    Fear of current or ex partner: Not on file    Emotionally abused: Not on file    Physically abused: Not on file    Forced sexual activity: Not on file  Other Topics Concern  . Not on file  Social History Narrative   Lives at home with wife.   Caffeine use: Drink coke (5-6 glasses per day)    Outpatient Medications Prior to Visit  Medication Sig Dispense Refill  . albuterol (PROVENTIL HFA;VENTOLIN HFA) 108 (90 Base) MCG/ACT inhaler INHALE TWO PUFFS BY MOUTH EVERY 6 HOURS AS NEEDED FOR WHEEZING 18 g 3  . amoxicillin (AMOXIL) 500 MG capsule Take 1 capsule (500 mg total) by mouth 3 (three) times daily. 30 capsule 0  . diltiazem (CARDIZEM CD) 180 MG  24 hr capsule Take 1 capsule (180 mg total) by mouth daily. 90 capsule 0  . rivaroxaban (XARELTO) 20 MG TABS tablet Take 1 tablet (20 mg total) by mouth daily. 90 tablet 3   No facility-administered medications prior to visit.     Allergies  Allergen Reactions  . Flexeril [Cyclobenzaprine] Other (See Comments)    Heart race  . Peanut-Containing Drug Products Hives and Shortness Of Breath  . Grapefruit Bioflavonoid Complex Other (See Comments)    Interaction with medication  . Other     Paprika  . Prednisone Other (See Comments)    Tremors    ROS Review of Systems  Constitutional: Negative.   Respiratory: Negative.   Cardiovascular: Negative.   Gastrointestinal: Negative.   Neurological: Positive for headaches. Negative for seizures, light-headedness and numbness.  Psychiatric/Behavioral: Negative.       Objective:    Physical Exam    Constitutional: He is oriented to person, place, and time. He appears well-nourished. He appears distressed.  HENT:  Head: Normocephalic and atraumatic.  Right Ear: External ear normal.  Left Ear: External ear normal.  Eyes: Right eye exhibits no discharge. Left eye exhibits no discharge. No scleral icterus.  Neck: No JVD present. No tracheal deviation present.  Cardiovascular: Normal rate, regular rhythm and normal heart sounds.  Pulmonary/Chest: Effort normal.  Neurological: He is alert and oriented to person, place, and time.  Skin: Skin is warm and dry. He is not diaphoretic.    BP (!) 142/80   Pulse 75   Ht 5' 8.58" (1.742 m)   Wt 163 lb (73.9 kg)   SpO2 98%   BMI 24.37 kg/m  Wt Readings from Last 3 Encounters:  02/03/18 163 lb (73.9 kg)  01/19/18 163 lb 2 oz (74 kg)  01/01/18 161 lb 3.2 oz (73.1 kg)   BP Readings from Last 3 Encounters:  02/03/18 (!) 142/80  01/19/18 128/80  01/01/18 130/83   Health Maintenance Due  Topic Date Due  . HIV Screening  07/08/1978  . COLONOSCOPY  07/07/2013    There are no preventive care reminders to display for this patient.  Lab Results  Component Value Date   TSH 0.75 01/19/2018   Lab Results  Component Value Date   WBC 7.1 01/19/2018   HGB 17.2 (H) 01/19/2018   HCT 50.1 01/19/2018   MCV 94.5 01/19/2018   PLT 198.0 01/19/2018   Lab Results  Component Value Date   NA 139 01/19/2018   K 4.1 01/19/2018   CO2 32 01/19/2018   GLUCOSE 97 01/19/2018   BUN 13 01/19/2018   CREATININE 1.29 01/19/2018   BILITOT 0.6 01/19/2018   ALKPHOS 62 01/19/2018   AST 16 01/19/2018   ALT 19 01/19/2018   PROT 6.7 01/19/2018   ALBUMIN 4.4 01/19/2018   CALCIUM 9.6 01/19/2018   ANIONGAP 7 02/17/2017   GFR 61.57 01/19/2018   Lab Results  Component Value Date   CHOL 163 01/19/2018   Lab Results  Component Value Date   HDL 43.30 01/19/2018   Lab Results  Component Value Date   LDLCALC 89 01/19/2018   Lab Results  Component  Value Date   TRIG 155.0 (H) 01/19/2018   Lab Results  Component Value Date   CHOLHDL 4 01/19/2018   No results found for: HGBA1C    Assessment & Plan:   Problem List Items Addressed This Visit      Cardiovascular and Mediastinum   PAF (paroxysmal atrial fibrillation) (HCC) - Primary  Relevant Medications   trandolapril (MAVIK) 1 MG tablet   atorvastatin (LIPITOR) 10 MG tablet   PVD (peripheral vascular disease) (HCC)   Relevant Medications   trandolapril (MAVIK) 1 MG tablet   atorvastatin (LIPITOR) 10 MG tablet   Essential hypertension   Relevant Medications   trandolapril (MAVIK) 1 MG tablet   atorvastatin (LIPITOR) 10 MG tablet     Other   Hematuria      Meds ordered this encounter  Medications  . trandolapril (MAVIK) 1 MG tablet    Sig: Take 1 tablet (1 mg total) by mouth daily.    Dispense:  90 tablet    Refill:  0  . atorvastatin (LIPITOR) 10 MG tablet    Sig: Take 1 tablet (10 mg total) by mouth daily.    Dispense:  90 tablet    Refill:  3  We will go ahead and add Mavik 1 mg daily.  He will continue on the higher dose of Cardizem.  We will go ahead and start atorvastatin 10 mg.  Pretreatment he does have a favorable lipid profile but would like to see the LDL cholesterol lower with his history of stroke.  He is motivated to quit smoking.  He has a standing appointment to follow-up on the 17th.  We will be rechecking his urine and.  Follow-up: Return in about 20 days (around 02/23/2018).

## 2018-02-03 NOTE — Telephone Encounter (Signed)
Pt c/o high blood pressure. Pt's BP yesterday 174/105 and then pt took HCTZ (that had previously been discontinued). Repeat blood pressure 170/100. This morning BP 145/100 at 6:00 am. Pt took another dose of HCTZ this morning. BP this morning is 153/106. Pt used automatic wrist BP monitor. Pt stated he has a mild headache to the left of his head. Pt stated he has been having sinus pain and congestion to the left side of the face. Pt denies weakness, chest pain, blurred vision, difficulty breathing or weakness.  Pt stated that his dose of Cardizem was increased to 180 mg from 120 mg and his HCTZ had been discontinued. Pt stated he became concerned and took the HCTZ  (1 dose last night, 1 dose this morning). Pt stated that the HCTZ affected his sexual performance. Pt wanting a medication adjustment. Care advice given to pt and pt verbalized understanding. Appointment made to see Dr Doreene BurkeKremer at 10:30 this morning.   Reason for Disposition . Systolic BP  >= 160 OR Diastolic >= 100  Answer Assessment - Initial Assessment Questions 1. BLOOD PRESSURE: "What is the blood pressure?" "Did you take at least two measurements 5 minutes apart?"     145/100   153/106 2. ONSET: "When did you take your blood pressure?"     0600     During triage call.  3. HOW: "How did you obtain the blood pressure?" (e.g., visiting nurse, automatic home BP monitor)     Automatic BP machine wrist BP machine 4. HISTORY: "Do you have a history of high blood pressure?"     yes 5. MEDICATIONS: "Are you taking any medications for blood pressure?" "Have you missed any doses recently?"     No pt took HCTZ last night and today and takes Cardizem 6. OTHER SYMPTOMS: "Do you have any symptoms?" (e.g., headache, chest pain, blurred vision, difficulty breathing, weakness)     Headache to the left side of the head. Pt stated that his sinuses on the left side are congestion- pt stated headache is mild), denies other symptoms 7. PREGNANCY: "Is  there any chance you are pregnant?" "When was your last menstrual period?"     n/a  Protocols used: HIGH BLOOD PRESSURE-A-AH

## 2018-02-23 ENCOUNTER — Ambulatory Visit: Payer: BLUE CROSS/BLUE SHIELD | Admitting: Family Medicine

## 2018-02-23 DIAGNOSIS — Z0289 Encounter for other administrative examinations: Secondary | ICD-10-CM

## 2018-03-17 ENCOUNTER — Ambulatory Visit: Payer: BLUE CROSS/BLUE SHIELD | Admitting: Family Medicine

## 2018-03-17 DIAGNOSIS — Z0289 Encounter for other administrative examinations: Secondary | ICD-10-CM

## 2018-04-19 ENCOUNTER — Other Ambulatory Visit: Payer: Self-pay | Admitting: Family Medicine

## 2018-04-19 DIAGNOSIS — Z8673 Personal history of transient ischemic attack (TIA), and cerebral infarction without residual deficits: Secondary | ICD-10-CM

## 2018-04-19 DIAGNOSIS — I480 Paroxysmal atrial fibrillation: Secondary | ICD-10-CM

## 2018-04-19 MED ORDER — DILTIAZEM HCL ER COATED BEADS 180 MG PO CP24: 180.0000 mg | ORAL_CAPSULE | Freq: Every day | ORAL | 0 refills | Status: DC

## 2018-04-19 NOTE — Telephone Encounter (Signed)
Copied from CRM [id number]. Topic: Quick Communication - Rx Refill/Question >> Apr 19, 2018  1:42 PM Jilda Roche wrote: Medication: diltiazem (CARDIZEM CD) 180 MG 24 hr capsule  Has the patient contacted their pharmacy? Yes.   (Agent: If no, request that the patient contact the pharmacy for the refill.) (Agent: If yes, when and what did the pharmacy advise?) Call office, patient only has 2 pills left  Preferred Pharmacy (with phone number or street name): [facility] [address] [place] - [address] [phone] (Phone) [phone] (Fax)    Agent: Please be advised that RX refills may take up to 3 business days. We ask that you follow-up with your pharmacy.

## 2018-04-19 NOTE — Telephone Encounter (Signed)
Requested medication (s) are due for refill today: yes  Requested medication (s) are on the active medication list: yes  Last refill:  01/19/18 for 90 tabs  Future visit scheduled: yes  Notes to clinic:  Calcium channel blockers failed. Failed b/p in normal range.  Requested Prescriptions  Pending Prescriptions Disp Refills   diltiazem (CARDIZEM CD) 180 MG 24 hr capsule 90 capsule 0    Sig: Take 1 capsule (180 mg total) by mouth daily.     Cardiovascular:  Calcium Channel Blockers Failed - 04/19/2018  2:37 PM      Failed - Last BP in normal range    BP Readings from Last 1 Encounters:  02/03/18 (!) 142/80         Passed - Valid encounter within last 6 months    Recent Outpatient Visits          2 months ago PAF (paroxysmal atrial fibrillation) (HCC)   [facility] Doreene Burke, Talmadge Coventry, MD   3 months ago PAF (paroxysmal atrial fibrillation) [facility])   [facility] R[facility], Talmadge Coventry, MD   3 months ago Abnormal peripheral pulse   [facility] [place] D, PA-C   6 months ago Paroxysmal atrial fibrillation [facility])   [facility] at [facility], [place], [place]   7 months ago Cough   [facility] at [facility], [place], [place]      Future Appointments            In 3 days Doreene Burke, Talmadge Coventry, MD [facility], [facility]

## 2018-04-21 ENCOUNTER — Encounter: Payer: Self-pay | Admitting: Family Medicine

## 2018-04-22 ENCOUNTER — Ambulatory Visit: Payer: BLUE CROSS/BLUE SHIELD | Admitting: Family Medicine

## 2018-04-27 ENCOUNTER — Ambulatory Visit: Payer: BLUE CROSS/BLUE SHIELD | Admitting: Family Medicine

## 2018-04-30 ENCOUNTER — Telehealth: Payer: Self-pay | Admitting: Family Medicine

## 2018-04-30 NOTE — Telephone Encounter (Signed)
Copied from CRM [id number]. 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 On 03/17/2018 pt states he called to cancel and reschedule but was <24 hours and was advised of no show fee. 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 He is very upset about the fees and states this has never happened to him before. He states that he likes Dr. Doreene Burke but isn't sure he can return if these fees are going to be added when he has to work and has short notice. How would the patient like to see this issue resolved? Pt is wanting fees to be waived - please call to notify pt of decision >> Apr 30, 2018  1:50 PM [place], Kenney Houseman D wrote: Called patient left message on voice mail to return call.

## 2018-05-19 ENCOUNTER — Telehealth: Payer: Self-pay

## 2018-05-19 NOTE — Telephone Encounter (Signed)
Patient returning phone call. I discussed with patient about the policy of the no show fee and our office is understanding about his work schedule. patient is aware to call office and ask to speak with Kenney Houseman or just make office aware that if he needs to cancel on short notice due to work. I discussed with patient that we will try to do everything we can to help with the last minute cancellation but we do have a no show/ cancel within 24 hour policy in place. Our office  removed the 2 no show fees that patient was billed for.

## 2018-05-30 ENCOUNTER — Encounter: Payer: Self-pay | Admitting: Family Medicine

## 2018-05-30 ENCOUNTER — Other Ambulatory Visit: Payer: Self-pay | Admitting: Family Medicine

## 2018-05-30 DIAGNOSIS — Z720 Tobacco use: Secondary | ICD-10-CM

## 2018-05-30 DIAGNOSIS — I480 Paroxysmal atrial fibrillation: Secondary | ICD-10-CM

## 2018-05-30 DIAGNOSIS — Z8673 Personal history of transient ischemic attack (TIA), and cerebral infarction without residual deficits: Secondary | ICD-10-CM

## 2018-05-31 MED ORDER — DILTIAZEM HCL ER COATED BEADS 180 MG PO CP24: 180.0000 mg | ORAL_CAPSULE | Freq: Every day | ORAL | 0 refills | Status: DC

## 2018-05-31 MED ORDER — NICOTINE 21 MG/24HR TD PT24: 21.0000 mg | MEDICATED_PATCH | Freq: Every day | TRANSDERMAL | 0 refills | Status: DC

## 2018-05-31 NOTE — Telephone Encounter (Signed)
Okay to refill. Patient needs appointment.

## 2018-06-01 ENCOUNTER — Ambulatory Visit: Payer: BLUE CROSS/BLUE SHIELD | Admitting: Family Medicine

## 2018-06-03 ENCOUNTER — Ambulatory Visit (INDEPENDENT_AMBULATORY_CARE_PROVIDER_SITE_OTHER): Payer: BLUE CROSS/BLUE SHIELD | Admitting: Family Medicine

## 2018-06-03 ENCOUNTER — Encounter: Payer: Self-pay | Admitting: Family Medicine

## 2018-06-03 VITALS — Ht 68.58 in

## 2018-06-03 DIAGNOSIS — N5201 Erectile dysfunction due to arterial insufficiency: Secondary | ICD-10-CM

## 2018-06-03 DIAGNOSIS — I10 Essential (primary) hypertension: Secondary | ICD-10-CM | POA: Diagnosis not present

## 2018-06-03 DIAGNOSIS — I480 Paroxysmal atrial fibrillation: Secondary | ICD-10-CM

## 2018-06-03 DIAGNOSIS — Z8673 Personal history of transient ischemic attack (TIA), and cerebral infarction without residual deficits: Secondary | ICD-10-CM

## 2018-06-03 MED ORDER — SILDENAFIL CITRATE 20 MG PO TABS: ORAL_TABLET | ORAL | 0 refills | Status: DC

## 2018-06-03 MED ORDER — HYDROCHLOROTHIAZIDE 25 MG PO TABS: 25.0000 mg | ORAL_TABLET | Freq: Every day | ORAL | 0 refills | Status: DC

## 2018-06-03 MED ORDER — DILTIAZEM HCL ER COATED BEADS 180 MG PO CP24: 180.0000 mg | ORAL_CAPSULE | Freq: Every day | ORAL | 0 refills | Status: DC

## 2018-06-03 NOTE — Progress Notes (Addendum)
Established Patient Office Visit  Subjective:  Patient ID: Frank Silva, male    DOB: 07/09/1965  Age: 53 y.o. MRN: [id number]  CC:  Chief Complaint  Patient presents with  . Follow-up   Virtual Visit via Video Note  I connected with Frank Silva on 07/29/18 at  2:00 PM EDT by a video enabled telemedicine application and verified that I am speaking with the correct person using two identifiers.  Location: Patient: work Provider:    I discussed the limitations of evaluation and management by telemedicine and the availability of in person appointments. The patient expressed understanding and agreed to proceed.  History of Present Illness:    Observations/Objective:   Assessment and Plan:   Follow Up Instructions:    I discussed the assessment and treatment plan with the patient. The patient was provided an opportunity to ask questions and all were answered. The patient agreed with the plan and demonstrated an understanding of the instructions.   The patient was advised to call back or seek an in-person evaluation if the symptoms worsen or if the condition fails to improve as anticipated.  I provided 25 minutes of non-face-to-face time during this encounter.   Mliss Sax, MD  HPI Frank Silva presents for FU presents for follow-up of his hypertension and PAF by teleconference secondary to the [place] pandemic.  Rate is been well controlled with the Cardizem 180.  Blood pressure has been in the 1 20-1 30 range with some excursions up into the 160s.  He took of A. fib for short while but experienced fatigue while taking it and discontinued it himself.  Libido is good but has issues with the ED from time to time.  He is planning on using the patch to help him quit smoking.  Prescription has been sent to the pharmacy for NicoDerm CQ 21 mg patches.  Past Medical History:  Diagnosis Date  . A-fib (HCC)   . Allergy   . Atrial fibrillation (HCC)   .  Neuromuscular disorder (HCC)   . Stroke [facility])     Past Surgical History:  Procedure Laterality Date  . CARDIAC CATHETERIZATION      Family History  Problem Relation Age of Onset  . CVA Father     Social History   Socioeconomic History  . Marital status: Married    Spouse name: [name]  . Number of children: 3  . Years of education: 86  . Highest education level: Not on file  Occupational History  . Occupation: Unemployed  Social Needs  . Financial resource strain: Not on file  . Food insecurity:    Worry: Not on file    Inability: Not on file  . Transportation needs:    Medical: Not on file    Non-medical: Not on file  Tobacco Use  . Smoking status: Current Every Day Smoker    Packs/day: 1.50    Types: Cigarettes  . Smokeless tobacco: Never Used  Substance and Sexual Activity  . Alcohol use: No    Alcohol/week: 0.0 standard drinks  . Drug use: No  . Sexual activity: Yes  Lifestyle  . Physical activity:    Days per week: Not on file    Minutes per session: Not on file  . Stress: Not on file  Relationships  . Social connections:    Talks on phone: Not on file    Gets together: Not on file    Attends religious service: Not on file  Active member of club or organization: Not on file    Attends meetings of clubs or organizations: Not on file    Relationship status: Not on file  . Intimate partner violence:    Fear of current or ex partner: Not on file    Emotionally abused: Not on file    Physically abused: Not on file    Forced sexual activity: Not on file  Other Topics Concern  . Not on file  Social History Narrative   Lives at home with wife.   Caffeine use: Drink coke (5-6 glasses per day)    Outpatient Medications Prior to Visit  Medication Sig Dispense Refill  . albuterol (PROVENTIL HFA;VENTOLIN HFA) 108 (90 Base) MCG/ACT inhaler INHALE TWO PUFFS BY MOUTH EVERY 6 HOURS AS NEEDED FOR WHEEZING 18 g 3  . atorvastatin (LIPITOR) 10 MG tablet Take 1  tablet (10 mg total) by mouth daily. 90 tablet 3  . nicotine (NICODERM CQ) 21 mg/24hr patch Place 1 patch (21 mg total) onto the skin daily. 28 patch 0  . rivaroxaban (XARELTO) 20 MG TABS tablet Take 1 tablet (20 mg total) by mouth daily. 90 tablet 3  . diltiazem (CARDIZEM CD) 180 MG 24 hr capsule Take 1 capsule (180 mg total) by mouth daily. 90 capsule 0  . hydrochlorothiazide (HYDRODIURIL) 12.5 MG tablet Take 12.5 mg by mouth daily.    . trandolapril (MAVIK) 1 MG tablet Take 1 tablet (1 mg total) by mouth daily. 90 tablet 0  . amoxicillin (AMOXIL) 500 MG capsule Take 1 capsule (500 mg total) by mouth 3 (three) times daily. 30 capsule 0   No facility-administered medications prior to visit.     Allergies  Allergen Reactions  . Flexeril [Cyclobenzaprine] Other (See Comments)    Heart race  . Peanut-Containing Drug Products Hives and Shortness Of Breath  . Grapefruit Bioflavonoid Complex Other (See Comments)    Interaction with medication  . Other     Paprika  . Prednisone Other (See Comments)    Tremors    ROS Review of Systems  Constitutional: Negative for diaphoresis, fatigue, fever and unexpected weight change.  Respiratory: Negative.   Cardiovascular: Negative.   Gastrointestinal: Negative.   Musculoskeletal: Negative for gait problem and joint swelling.  Skin: Negative for pallor and rash.  Neurological: Negative for light-headedness and headaches.  Hematological: Does not bruise/bleed easily.  Psychiatric/Behavioral: Negative.       Objective:    Physical Exam  Constitutional: He is oriented to person, place, and time.  Neurological: He is alert and oriented to person, place, and time.  Psychiatric: He has a normal mood and affect. His behavior is normal.    Ht 5' 8.58" (1.742 m)   BMI 24.37 kg/m  Wt Readings from Last 3 Encounters:  02/03/18 163 lb (73.9 kg)  01/19/18 163 lb 2 oz (74 kg)  01/01/18 161 lb 3.2 oz (73.1 kg)   BP Readings from Last 3  Encounters:  02/03/18 (!) 142/80  01/19/18 128/80  01/01/18 130/83   Guideline developer:  UpToDate (see UpToDate for funding source) Date Released: June 2014  Health Maintenance Due  Topic Date Due  . HIV Screening  07/08/1978  . COLONOSCOPY  07/07/2013    There are no preventive care reminders to display for this patient.  Lab Results  Component Value Date   TSH 0.75 01/19/2018   Lab Results  Component Value Date   WBC 7.1 01/19/2018   HGB 17.2 (H) 01/19/2018  HCT 50.1 01/19/2018   MCV 94.5 01/19/2018   PLT 198.0 01/19/2018   Lab Results  Component Value Date   NA 139 01/19/2018   K 4.1 01/19/2018   CO2 32 01/19/2018   GLUCOSE 97 01/19/2018   BUN 13 01/19/2018   CREATININE 1.29 01/19/2018   BILITOT 0.6 01/19/2018   ALKPHOS 62 01/19/2018   AST 16 01/19/2018   ALT 19 01/19/2018   PROT 6.7 01/19/2018   ALBUMIN 4.4 01/19/2018   CALCIUM 9.6 01/19/2018   ANIONGAP 7 02/17/2017   GFR 61.57 01/19/2018   Lab Results  Component Value Date   CHOL 163 01/19/2018   Lab Results  Component Value Date   HDL 43.30 01/19/2018   Lab Results  Component Value Date   LDLCALC 89 01/19/2018   Lab Results  Component Value Date   TRIG 155.0 (H) 01/19/2018   Lab Results  Component Value Date   CHOLHDL 4 01/19/2018   No results found for: HGBA1C    Assessment & Plan:   Problem List Items Addressed This Visit      Cardiovascular and Mediastinum   PAF (paroxysmal atrial fibrillation) (HCC)   Relevant Medications   hydrochlorothiazide (HYDRODIURIL) 25 MG tablet   sildenafil (REVATIO) 20 MG tablet   diltiazem (CARDIZEM CD) 180 MG 24 hr capsule   Essential hypertension - Primary   Relevant Medications   hydrochlorothiazide (HYDRODIURIL) 25 MG tablet   sildenafil (REVATIO) 20 MG tablet   diltiazem (CARDIZEM CD) 180 MG 24 hr capsule   Erectile dysfunction due to arterial insufficiency   Relevant Medications   hydrochlorothiazide (HYDRODIURIL) 25 MG tablet    sildenafil (REVATIO) 20 MG tablet   diltiazem (CARDIZEM CD) 180 MG 24 hr capsule     Other   History of CVA (cerebrovascular accident)   Relevant Medications   diltiazem (CARDIZEM CD) 180 MG 24 hr capsule      Meds ordered this encounter  Medications  . hydrochlorothiazide (HYDRODIURIL) 25 MG tablet    Sig: Take 1 tablet (25 mg total) by mouth daily.    Dispense:  90 tablet    Refill:  0  . sildenafil (REVATIO) 20 MG tablet    Sig: Take 3 to 5 pills 45 minutes prior    Dispense:  50 tablet    Refill:  0  . diltiazem (CARDIZEM CD) 180 MG 24 hr capsule    Sig: Take 1 capsule (180 mg total) by mouth daily.    Dispense:  90 capsule    Refill:  0    Follow-up: Return in about 3 months (around 09/03/2018).   Patient will follow-up in 3 months or sooner.  Discussed Revatio and its use.  He will let me know if there is any problems with the higher dose of HCTZ.  He will continue to monitor his blood pressures at home.

## 2018-06-04 ENCOUNTER — Encounter: Payer: Self-pay | Admitting: Family Medicine

## 2018-07-29 NOTE — Addendum Note (Signed)
Addended by: Andrez Grime on: 07/29/2018 01:12 PM   Modules accepted: Level of Service

## 2018-08-03 ENCOUNTER — Encounter: Payer: Self-pay | Admitting: Family Medicine

## 2018-08-03 ENCOUNTER — Ambulatory Visit: Payer: BLUE CROSS/BLUE SHIELD | Admitting: Family Medicine

## 2018-08-03 VITALS — BP 128/80 | HR 77 | Ht 68.58 in

## 2018-08-03 DIAGNOSIS — Z8673 Personal history of transient ischemic attack (TIA), and cerebral infarction without residual deficits: Secondary | ICD-10-CM

## 2018-08-03 DIAGNOSIS — I739 Peripheral vascular disease, unspecified: Secondary | ICD-10-CM

## 2018-08-03 DIAGNOSIS — B070 Plantar wart: Secondary | ICD-10-CM

## 2018-08-03 DIAGNOSIS — L03113 Cellulitis of right upper limb: Secondary | ICD-10-CM | POA: Diagnosis not present

## 2018-08-03 DIAGNOSIS — I480 Paroxysmal atrial fibrillation: Secondary | ICD-10-CM

## 2018-08-03 DIAGNOSIS — I10 Essential (primary) hypertension: Secondary | ICD-10-CM | POA: Diagnosis not present

## 2018-08-03 MED ORDER — ATORVASTATIN CALCIUM 10 MG PO TABS: 10.0000 mg | ORAL_TABLET | Freq: Every day | ORAL | 1 refills | Status: DC

## 2018-08-03 MED ORDER — HYDROCHLOROTHIAZIDE 25 MG PO TABS: 25.0000 mg | ORAL_TABLET | Freq: Every day | ORAL | 1 refills | Status: DC

## 2018-08-03 MED ORDER — CEPHALEXIN 500 MG PO CAPS: 500.0000 mg | ORAL_CAPSULE | Freq: Three times a day (TID) | ORAL | 0 refills | Status: AC

## 2018-08-03 MED ORDER — DILTIAZEM HCL ER COATED BEADS 180 MG PO CP24: 180.0000 mg | ORAL_CAPSULE | Freq: Every day | ORAL | 1 refills | Status: DC

## 2018-08-03 NOTE — Progress Notes (Signed)
Established Patient Office Visit  Subjective:  Patient ID: Frank Silva, male    DOB: [birth year]  Age: 53 y.o. MRN: [id number]  CC: No chief complaint on file.   HPI Frank Silva presents for evaluation and treatment of erythema and swelling of the medial posterior section of his right hand.  He believes this started after he had received what he believes to be some type of an insect bite in the webspace between his fourth and fifth digit.  Did not appreciate bite or heel pain.  There is been no fever or streaking up the arm.  He also has places on the lateral aspect of the sole of his right foot that he has been treating with Compound W.  He is not certain of whether or not they are warts or something else.  He has some small lesions on the palmar surface of his hands as well.  Blood pressure is been well controlled on his current regimen.  He is due for follow-up next month.  Past Medical History:  Diagnosis Date  . A-fib (HCC)   . Allergy   . Atrial fibrillation (HCC)   . Neuromuscular disorder (HCC)   . Stroke [facility])     Past Surgical History:  Procedure Laterality Date  . CARDIAC CATHETERIZATION      Family History  Problem Relation Age of Onset  . CVA Father     Social History   Socioeconomic History  . Marital status: Married    Spouse name: [name]  . Number of children: 3  . Years of education: 4  . Highest education level: Not on file  Occupational History  . Occupation: Unemployed  Social Needs  . Financial resource strain: Not on file  . Food insecurity:    Worry: Not on file    Inability: Not on file  . Transportation needs:    Medical: Not on file    Non-medical: Not on file  Tobacco Use  . Smoking status: Current Every Day Smoker    Packs/day: 1.50    Types: Cigarettes  . Smokeless tobacco: Never Used  Substance and Sexual Activity  . Alcohol use: No    Alcohol/week: 0.0 standard drinks  . Drug use: No  . Sexual activity: Yes  Lifestyle   . Physical activity:    Days per week: Not on file    Minutes per session: Not on file  . Stress: Not on file  Relationships  . Social connections:    Talks on phone: Not on file    Gets together: Not on file    Attends religious service: Not on file    Active member of club or organization: Not on file    Attends meetings of clubs or organizations: Not on file    Relationship status: Not on file  . Intimate partner violence:    Fear of current or ex partner: Not on file    Emotionally abused: Not on file    Physically abused: Not on file    Forced sexual activity: Not on file  Other Topics Concern  . Not on file  Social History Narrative   Lives at home with wife.   Caffeine use: Drink coke (5-6 glasses per day)    Outpatient Medications Prior to Visit  Medication Sig Dispense Refill  . albuterol (PROVENTIL HFA;VENTOLIN HFA) 108 (90 Base) MCG/ACT inhaler INHALE TWO PUFFS BY MOUTH EVERY 6 HOURS AS NEEDED FOR WHEEZING 18 g 3  . nicotine (NICODERM  CQ) 21 mg/24hr patch Place 1 patch (21 mg total) onto the skin daily. 28 patch 0  . rivaroxaban (XARELTO) 20 MG TABS tablet Take 1 tablet (20 mg total) by mouth daily. 90 tablet 3  . sildenafil (REVATIO) 20 MG tablet Take 3 to 5 pills 45 minutes prior 50 tablet 0  . atorvastatin (LIPITOR) 10 MG tablet Take 1 tablet (10 mg total) by mouth daily. 90 tablet 3  . diltiazem (CARDIZEM CD) 180 MG 24 hr capsule Take 1 capsule (180 mg total) by mouth daily. 90 capsule 0  . hydrochlorothiazide (HYDRODIURIL) 25 MG tablet Take 1 tablet (25 mg total) by mouth daily. 90 tablet 0   No facility-administered medications prior to visit.     Allergies  Allergen Reactions  . Flexeril [Cyclobenzaprine] Other (See Comments)    Heart race  . Peanut-Containing Drug Products Hives and Shortness Of Breath  . Grapefruit Bioflavonoid Complex Other (See Comments)    Interaction with medication  . Other     Paprika  . Prednisone Other (See Comments)     Tremors    ROS Review of Systems  Constitutional: Negative for chills, diaphoresis, fatigue, fever and unexpected weight change.  Eyes: Negative for photophobia and visual disturbance.  Respiratory: Negative.   Cardiovascular: Negative.   Gastrointestinal: Negative.   Genitourinary: Negative.   Skin: Positive for color change, rash and wound.  Allergic/Immunologic: Negative for immunocompromised state.  Neurological: Negative for headaches.  Psychiatric/Behavioral: Negative.       Objective:    Physical Exam  Constitutional: He is oriented to person, place, and time. He appears well-developed and well-nourished. No distress.  HENT:  Head: Normocephalic and atraumatic.  Right Ear: External ear normal.  Left Ear: External ear normal.  Eyes: Conjunctivae are normal. Right eye exhibits no discharge. Left eye exhibits no discharge. No scleral icterus.  Neck: No JVD present. No tracheal deviation present.  Pulmonary/Chest: Effort normal. No stridor.  Neurological: He is alert and oriented to person, place, and time.  Skin: He is not diaphoretic.       BP 128/80   Pulse 77   Ht 5' 8.58" (1.742 m)   SpO2 95%   BMI 24.37 kg/m  Wt Readings from Last 3 Encounters:  02/03/18 163 lb (73.9 kg)  01/19/18 163 lb 2 oz (74 kg)  01/01/18 161 lb 3.2 oz (73.1 kg)     Health Maintenance Due  Topic Date Due  . HIV Screening  07/08/1978  . COLONOSCOPY  07/07/2013    There are no preventive care reminders to display for this patient.  Lab Results  Component Value Date   TSH 0.75 01/19/2018   Lab Results  Component Value Date   WBC 7.1 01/19/2018   HGB 17.2 (H) 01/19/2018   HCT 50.1 01/19/2018   MCV 94.5 01/19/2018   PLT 198.0 01/19/2018   Lab Results  Component Value Date   NA 139 01/19/2018   K 4.1 01/19/2018   CO2 32 01/19/2018   GLUCOSE 97 01/19/2018   BUN 13 01/19/2018   CREATININE 1.29 01/19/2018   BILITOT 0.6 01/19/2018   ALKPHOS 62 01/19/2018   AST 16  01/19/2018   ALT 19 01/19/2018   PROT 6.7 01/19/2018   ALBUMIN 4.4 01/19/2018   CALCIUM 9.6 01/19/2018   ANIONGAP 7 02/17/2017   GFR 61.57 01/19/2018   Lab Results  Component Value Date   CHOL 163 01/19/2018   Lab Results  Component Value Date   HDL 43.30 01/19/2018  Lab Results  Component Value Date   LDLCALC 89 01/19/2018   Lab Results  Component Value Date   TRIG 155.0 (H) 01/19/2018   Lab Results  Component Value Date   CHOLHDL 4 01/19/2018   No results found for: HGBA1C    Assessment & Plan:   Problem List Items Addressed This Visit      Cardiovascular and Mediastinum   PAF (paroxysmal atrial fibrillation) (HCC)   Relevant Medications   hydrochlorothiazide (HYDRODIURIL) 25 MG tablet   atorvastatin (LIPITOR) 10 MG tablet   diltiazem (CARDIZEM CD) 180 MG 24 hr capsule   PVD (peripheral vascular disease) (HCC)   Relevant Medications   hydrochlorothiazide (HYDRODIURIL) 25 MG tablet   atorvastatin (LIPITOR) 10 MG tablet   diltiazem (CARDIZEM CD) 180 MG 24 hr capsule   Essential hypertension - Primary   Relevant Medications   hydrochlorothiazide (HYDRODIURIL) 25 MG tablet   atorvastatin (LIPITOR) 10 MG tablet   diltiazem (CARDIZEM CD) 180 MG 24 hr capsule     Musculoskeletal and Integument   Plantar wart   Relevant Medications   cephALEXin (KEFLEX) 500 MG capsule   Other Relevant Orders   Ambulatory referral to Dermatology     Other   History of CVA (cerebrovascular accident)   Relevant Medications   diltiazem (CARDIZEM CD) 180 MG 24 hr capsule   Cellulitis of right upper extremity   Relevant Medications   cephALEXin (KEFLEX) 500 MG capsule      Meds ordered this encounter  Medications  . cephALEXin (KEFLEX) 500 MG capsule    Sig: Take 1 capsule (500 mg total) by mouth 3 (three) times daily for 10 days.    Dispense:  30 capsule    Refill:  0  . hydrochlorothiazide (HYDRODIURIL) 25 MG tablet    Sig: Take 1 tablet (25 mg total) by mouth daily.     Dispense:  90 tablet    Refill:  1  . atorvastatin (LIPITOR) 10 MG tablet    Sig: Take 1 tablet (10 mg total) by mouth daily.    Dispense:  90 tablet    Refill:  1  . diltiazem (CARDIZEM CD) 180 MG 24 hr capsule    Sig: Take 1 capsule (180 mg total) by mouth daily.    Dispense:  90 capsule    Refill:  1    Follow-up: Return if symptoms worsen or fail to improve.   The lesion in the webspace of his right hand could very well represent an insect bite.  Not entirely sure.  We will treat the apparent corresponding cellulitis with Keflex.  Patient will follow-up if this is not improving within a week.  He has scheduled follow-up for his cholesterol and hypertension next month. Mliss Sax, MD

## 2018-08-03 NOTE — Patient Instructions (Signed)

## 2018-08-03 NOTE — Progress Notes (Deleted)
Established Patient Office Visit  Subjective:  Patient ID: Frank Silva, male    DOB: 07/05/1965  Age: 53 y.o. MRN: [id number]  CC: No chief complaint on file.   HPI Frank Silva presents for ***  Past Medical History:  Diagnosis Date  . A-fib (HCC)   . Allergy   . Atrial fibrillation (HCC)   . Neuromuscular disorder (HCC)   . Stroke [facility])     Past Surgical History:  Procedure Laterality Date  . CARDIAC CATHETERIZATION      Family History  Problem Relation Age of Onset  . CVA Father     Social History   Socioeconomic History  . Marital status: Married    Spouse name: [name]  . Number of children: 3  . Years of education: 74  . Highest education level: Not on file  Occupational History  . Occupation: Unemployed  Social Needs  . Financial resource strain: Not on file  . Food insecurity:    Worry: Not on file    Inability: Not on file  . Transportation needs:    Medical: Not on file    Non-medical: Not on file  Tobacco Use  . Smoking status: Current Every Day Smoker    Packs/day: 1.50    Types: Cigarettes  . Smokeless tobacco: Never Used  Substance and Sexual Activity  . Alcohol use: No    Alcohol/week: 0.0 standard drinks  . Drug use: No  . Sexual activity: Yes  Lifestyle  . Physical activity:    Days per week: Not on file    Minutes per session: Not on file  . Stress: Not on file  Relationships  . Social connections:    Talks on phone: Not on file    Gets together: Not on file    Attends religious service: Not on file    Active member of club or organization: Not on file    Attends meetings of clubs or organizations: Not on file    Relationship status: Not on file  . Intimate partner violence:    Fear of current or ex partner: Not on file    Emotionally abused: Not on file    Physically abused: Not on file    Forced sexual activity: Not on file  Other Topics Concern  . Not on file  Social History Narrative   Lives at home with wife.    Caffeine use: Drink coke (5-6 glasses per day)    Outpatient Medications Prior to Visit  Medication Sig Dispense Refill  . albuterol (PROVENTIL HFA;VENTOLIN HFA) 108 (90 Base) MCG/ACT inhaler INHALE TWO PUFFS BY MOUTH EVERY 6 HOURS AS NEEDED FOR WHEEZING 18 g 3  . atorvastatin (LIPITOR) 10 MG tablet Take 1 tablet (10 mg total) by mouth daily. 90 tablet 3  . diltiazem (CARDIZEM CD) 180 MG 24 hr capsule Take 1 capsule (180 mg total) by mouth daily. 90 capsule 0  . hydrochlorothiazide (HYDRODIURIL) 25 MG tablet Take 1 tablet (25 mg total) by mouth daily. 90 tablet 0  . nicotine (NICODERM CQ) 21 mg/24hr patch Place 1 patch (21 mg total) onto the skin daily. 28 patch 0  . rivaroxaban (XARELTO) 20 MG TABS tablet Take 1 tablet (20 mg total) by mouth daily. 90 tablet 3  . sildenafil (REVATIO) 20 MG tablet Take 3 to 5 pills 45 minutes prior 50 tablet 0   No facility-administered medications prior to visit.     Allergies  Allergen Reactions  . Flexeril [Cyclobenzaprine] Other (  See Comments)    Heart race  . Peanut-Containing Drug Products Hives and Shortness Of Breath  . Grapefruit Bioflavonoid Complex Other (See Comments)    Interaction with medication  . Other     Paprika  . Prednisone Other (See Comments)    Tremors    ROS Review of Systems    Objective:    Physical Exam  BP 128/80   Pulse 77   Ht 5' 8.58" (1.742 m)   SpO2 95%   BMI 24.37 kg/m  Wt Readings from Last 3 Encounters:  02/03/18 163 lb (73.9 kg)  01/19/18 163 lb 2 oz (74 kg)  01/01/18 161 lb 3.2 oz (73.1 kg)   BP Readings from Last 3 Encounters:  08/03/18 128/80  02/03/18 (!) 142/80  01/19/18 128/80   Guideline developer:  UpToDate (see UpToDate for funding source) Date Released: June 2014  Health Maintenance Due  Topic Date Due  . HIV Screening  07/08/1978  . COLONOSCOPY  07/07/2013    There are no preventive care reminders to display for this patient.  Lab Results  Component Value Date   TSH  0.75 01/19/2018   Lab Results  Component Value Date   WBC 7.1 01/19/2018   HGB 17.2 (H) 01/19/2018   HCT 50.1 01/19/2018   MCV 94.5 01/19/2018   PLT 198.0 01/19/2018   Lab Results  Component Value Date   NA 139 01/19/2018   K 4.1 01/19/2018   CO2 32 01/19/2018   GLUCOSE 97 01/19/2018   BUN 13 01/19/2018   CREATININE 1.29 01/19/2018   BILITOT 0.6 01/19/2018   ALKPHOS 62 01/19/2018   AST 16 01/19/2018   ALT 19 01/19/2018   PROT 6.7 01/19/2018   ALBUMIN 4.4 01/19/2018   CALCIUM 9.6 01/19/2018   ANIONGAP 7 02/17/2017   GFR 61.57 01/19/2018   Lab Results  Component Value Date   CHOL 163 01/19/2018   Lab Results  Component Value Date   HDL 43.30 01/19/2018   Lab Results  Component Value Date   LDLCALC 89 01/19/2018   Lab Results  Component Value Date   TRIG 155.0 (H) 01/19/2018   Lab Results  Component Value Date   CHOLHDL 4 01/19/2018   No results found for: HGBA1C    Assessment & Plan:   Problem List Items Addressed This Visit    None      No orders of the defined types were placed in this encounter.   Follow-up: No follow-ups on file.

## 2018-08-04 ENCOUNTER — Encounter: Payer: Self-pay | Admitting: Family Medicine

## 2018-08-04 DIAGNOSIS — I10 Essential (primary) hypertension: Secondary | ICD-10-CM

## 2018-08-04 MED ORDER — HYDROCHLOROTHIAZIDE 25 MG PO TABS: 25.0000 mg | ORAL_TABLET | Freq: Every day | ORAL | 1 refills | Status: DC

## 2018-08-06 ENCOUNTER — Encounter: Payer: Self-pay | Admitting: Family Medicine

## 2018-08-20 ENCOUNTER — Encounter: Payer: Self-pay | Admitting: Family Medicine

## 2018-09-29 ENCOUNTER — Encounter: Payer: Self-pay | Admitting: Family Medicine

## 2018-11-04 ENCOUNTER — Encounter: Payer: Self-pay | Admitting: Family Medicine

## 2018-11-08 ENCOUNTER — Other Ambulatory Visit: Payer: Self-pay

## 2018-11-08 ENCOUNTER — Encounter: Payer: Self-pay | Admitting: Family Medicine

## 2018-11-08 ENCOUNTER — Ambulatory Visit (INDEPENDENT_AMBULATORY_CARE_PROVIDER_SITE_OTHER): Payer: BC Managed Care – PPO | Admitting: Family Medicine

## 2018-11-08 VITALS — BP 130/80 | HR 75 | Ht 68.58 in | Wt 160.0 lb

## 2018-11-08 DIAGNOSIS — Z720 Tobacco use: Secondary | ICD-10-CM | POA: Diagnosis not present

## 2018-11-08 DIAGNOSIS — W57XXXA Bitten or stung by nonvenomous insect and other nonvenomous arthropods, initial encounter: Secondary | ICD-10-CM

## 2018-11-08 DIAGNOSIS — R5382 Chronic fatigue, unspecified: Secondary | ICD-10-CM

## 2018-11-08 DIAGNOSIS — I739 Peripheral vascular disease, unspecified: Secondary | ICD-10-CM | POA: Diagnosis not present

## 2018-11-08 DIAGNOSIS — R1011 Right upper quadrant pain: Secondary | ICD-10-CM | POA: Insufficient documentation

## 2018-11-08 DIAGNOSIS — Z1211 Encounter for screening for malignant neoplasm of colon: Secondary | ICD-10-CM

## 2018-11-08 DIAGNOSIS — I480 Paroxysmal atrial fibrillation: Secondary | ICD-10-CM | POA: Diagnosis not present

## 2018-11-08 DIAGNOSIS — I10 Essential (primary) hypertension: Secondary | ICD-10-CM

## 2018-11-08 DIAGNOSIS — R319 Hematuria, unspecified: Secondary | ICD-10-CM

## 2018-11-08 DIAGNOSIS — R634 Abnormal weight loss: Secondary | ICD-10-CM

## 2018-11-08 DIAGNOSIS — J4521 Mild intermittent asthma with (acute) exacerbation: Secondary | ICD-10-CM

## 2018-11-08 DIAGNOSIS — J45909 Unspecified asthma, uncomplicated: Secondary | ICD-10-CM | POA: Insufficient documentation

## 2018-11-08 LAB — COMPREHENSIVE METABOLIC PANEL
ALT: 21 U/L (ref 0–53)
AST: 21 U/L (ref 0–37)
Albumin: 4.2 g/dL (ref 3.5–5.2)
Alkaline Phosphatase: 63 U/L (ref 39–117)
BUN: 14 mg/dL (ref 6–23)
CO2: 34 mEq/L — ABNORMAL HIGH (ref 19–32)
Calcium: 9.8 mg/dL (ref 8.4–10.5)
Chloride: 100 mEq/L (ref 96–112)
Creatinine, Ser: 1.15 mg/dL (ref 0.40–1.50)
GFR: 65.94 mL/min (ref >60.00)
Glucose, Bld: 99 mg/dL (ref 70–99)
Potassium: 3.8 mEq/L (ref 3.5–5.1)
Sodium: 139 mEq/L (ref 135–145)
Total Bilirubin: 0.7 mg/dL (ref 0.2–1.2)
Total Protein: 7 g/dL (ref 6.0–8.3)

## 2018-11-08 LAB — URINALYSIS, ROUTINE W REFLEX MICROSCOPIC
Bilirubin Urine: NEGATIVE
Ketones, ur: NEGATIVE
Leukocytes,Ua: NEGATIVE
Nitrite: NEGATIVE
Specific Gravity, Urine: 1.01 (ref 1.000–1.030)
Total Protein, Urine: NEGATIVE
Urine Glucose: NEGATIVE
Urobilinogen, UA: 0.2 (ref 0.0–1.0)
pH: 7 (ref 5.0–8.0)

## 2018-11-08 LAB — CBC
HCT: 48.2 % (ref 39.0–52.0)
Hemoglobin: 16.8 g/dL (ref 13.0–17.0)
MCHC: 34.8 g/dL (ref 30.0–36.0)
MCV: 94.8 fl (ref 78.0–100.0)
Platelets: 191 10*3/uL (ref 150.0–400.0)
RBC: 5.09 Mil/uL (ref 4.22–5.81)
RDW: 12.8 % (ref 11.5–15.5)
WBC: 8 10*3/uL (ref 4.0–10.5)

## 2018-11-08 LAB — LDL CHOLESTEROL, DIRECT: Direct LDL: 58 mg/dL

## 2018-11-08 LAB — LIPID PANEL
Cholesterol: 110 mg/dL (ref 0–200)
HDL: 41.9 mg/dL (ref >39.00)
LDL Cholesterol: 51 mg/dL (ref 0–99)
NonHDL: 68.42
Total CHOL/HDL Ratio: 3
Triglycerides: 85 mg/dL (ref 0.0–149.0)
VLDL: 17 mg/dL (ref 0.0–40.0)

## 2018-11-08 MED ORDER — PREDNISONE 10 MG PO TABS: 10.0000 mg | ORAL_TABLET | Freq: Two times a day (BID) | ORAL | 0 refills | Status: AC

## 2018-11-08 MED ORDER — FLUTICASONE-SALMETEROL 250-50 MCG/DOSE IN AEPB: 1.0000 | INHALATION_SPRAY | Freq: Two times a day (BID) | RESPIRATORY_TRACT | 3 refills | Status: DC

## 2018-11-08 NOTE — Progress Notes (Addendum)
Established Patient Office Visit  Subjective:  Patient ID: Frank Silva, male    DOB: 06-18-1965  Age: 53 y.o. MRN: [id number]  CC:  Chief Complaint  Patient presents with  . Fatigue    HPI [name] presents for follow-up of his hypertension, PAF, peripheral vascular disease.  Continues to take all of his medicines as directed.  Blood pressure is been controlled.  He does experience occasional palpitations but denies any chest pain.  Has developed some wheezing over the last week or so.  He denies fevers chills nausea or vomiting vomiting or sputum production.  Does not have a diagnosis of COPD but is a longtime smoker.  Has never had a chest CT.  Continues to smoke a pack and a half of cigarettes daily.[name] of a six-month history of intermittent right upper quadrant pain that is sharp and stabbing at times.  There has been some change in his bowel habits with bouts of constipation alternating with loose stooling.  Denies blood in his stool or urine.  He is aware of his hematuria.  Once again denies seeing any blood in his urine.  He is taking Xarelto because of his PAF.  He is a long-term smoker.  Discussed the relationship with smoking and bladder cancer.  He has had multiple tick bites and is worried about Lyme's disease.  Past Medical History:  Diagnosis Date  . A-fib (HCC)   . Allergy   . Atrial fibrillation (HCC)   . Neuromuscular disorder (HCC)   . Stroke [facility])     Past Surgical History:  Procedure Laterality Date  . CARDIAC CATHETERIZATION      Family History  Problem Relation Age of Onset  . CVA Father     Social History   Socioeconomic History  . Marital status: Married    Spouse name: [name]  . Number of children: 3  . Years of education: 60  . Highest education level: Not on file  Occupational History  . Occupation: Unemployed  Social Needs  . Financial resource strain: Not on file  . Food insecurity    Worry: Not on file    Inability: Not on  file  . Transportation needs    Medical: Not on file    Non-medical: Not on file  Tobacco Use  . Smoking status: Current Every Day Smoker    Packs/day: 1.50    Types: Cigarettes  . Smokeless tobacco: Never Used  Substance and Sexual Activity  . Alcohol use: No    Alcohol/week: 0.0 standard drinks  . Drug use: No  . Sexual activity: Yes  Lifestyle  . Physical activity    Days per week: Not on file    Minutes per session: Not on file  . Stress: Not on file  Relationships  . Social Musician on phone: Not on file    Gets together: Not on file    Attends religious service: Not on file    Active member of club or organization: Not on file    Attends meetings of clubs or organizations: Not on file    Relationship status: Not on file  . Intimate partner violence    Fear of current or ex partner: Not on file    Emotionally abused: Not on file    Physically abused: Not on file    Forced sexual activity: Not on file  Other Topics Concern  . Not on file  Social History Narrative  Lives at home with wife.   Caffeine use: Drink coke (5-6 glasses per day)    Outpatient Medications Prior to Visit  Medication Sig Dispense Refill  . albuterol (PROVENTIL HFA;VENTOLIN HFA) 108 (90 Base) MCG/ACT inhaler INHALE TWO PUFFS BY MOUTH EVERY 6 HOURS AS NEEDED FOR WHEEZING 18 g 3  . atorvastatin (LIPITOR) 10 MG tablet Take 1 tablet (10 mg total) by mouth daily. 90 tablet 1  . diltiazem (CARDIZEM CD) 180 MG 24 hr capsule Take 1 capsule (180 mg total) by mouth daily. 90 capsule 1  . hydrochlorothiazide (HYDRODIURIL) 25 MG tablet Take 1 tablet (25 mg total) by mouth daily. 90 tablet 1  . nicotine (NICODERM CQ) 21 mg/24hr patch Place 1 patch (21 mg total) onto the skin daily. 28 patch 0  . rivaroxaban (XARELTO) 20 MG TABS tablet Take 1 tablet (20 mg total) by mouth daily. 90 tablet 3  . sildenafil (REVATIO) 20 MG tablet Take 3 to 5 pills 45 minutes prior 50 tablet 0   No  facility-administered medications prior to visit.     Allergies  Allergen Reactions  . Flexeril [Cyclobenzaprine] Other (See Comments)    Heart race  . Peanut-Containing Drug Products Hives and Shortness Of Breath  . Grapefruit Bioflavonoid Complex Other (See Comments)    Interaction with medication  . Other     Paprika  . Prednisone Other (See Comments)    Tremors    ROS Review of Systems  Constitutional: Positive for fatigue. Negative for chills, diaphoresis, fever and unexpected weight change.  HENT: Negative.   Eyes: Negative for photophobia and visual disturbance.  Respiratory: Positive for shortness of breath and wheezing. Negative for chest tightness.   Cardiovascular: Positive for palpitations. Negative for chest pain and leg swelling.  Gastrointestinal: Positive for constipation. Negative for anal bleeding, blood in stool, nausea and vomiting.  Endocrine: Negative for polyphagia and polyuria.  Genitourinary: Negative for decreased urine volume, difficulty urinating, frequency, hematuria and urgency.  Musculoskeletal: Positive for arthralgias and myalgias.  Skin: Negative for pallor and rash.  Allergic/Immunologic: Negative for immunocompromised state.  Neurological: Positive for light-headedness. Negative for speech difficulty, numbness and headaches.  Hematological: Does not bruise/bleed easily.  Psychiatric/Behavioral: Negative.       Objective:    Physical Exam  Constitutional: He is oriented to person, place, and time. He appears well-developed and well-nourished. No distress.  HENT:  Head: Normocephalic and atraumatic.  Right Ear: External ear normal.  Left Ear: External ear normal.  Mouth/Throat: Oropharynx is clear and moist. Abnormal dentition. Dental caries present. No dental abscesses. No oropharyngeal exudate.  Eyes: Pupils are equal, round, and reactive to light. Conjunctivae are normal. Right eye exhibits no discharge. Left eye exhibits no discharge.  No scleral icterus.  Neck: Neck supple. No JVD present. No tracheal deviation present. No thyromegaly present.  Cardiovascular: Normal rate, regular rhythm and normal heart sounds.  Pulmonary/Chest: Effort normal. No stridor. He has decreased breath sounds. He has no wheezes. He has no rhonchi. He has no rales.  Abdominal: Bowel sounds are normal. He exhibits no distension. There is no abdominal tenderness. There is no rebound and no guarding.  Musculoskeletal:        General: No edema.  Lymphadenopathy:    He has no cervical adenopathy.  Neurological: He is alert and oriented to person, place, and time.  Skin: Skin is warm and dry. He is not diaphoretic.  Psychiatric: He has a normal mood and affect. His behavior is normal.  BP 130/80   Pulse 75   Ht 5' 8.58" (1.742 m)   Wt 160 lb (72.6 kg)   SpO2 97%   BMI 23.92 kg/m  Wt Readings from Last 3 Encounters:  11/08/18 160 lb (72.6 kg)  02/03/18 163 lb (73.9 kg)  01/19/18 163 lb 2 oz (74 kg)   BP Readings from Last 3 Encounters:  11/08/18 130/80  08/03/18 128/80  02/03/18 (!) 142/80   Guideline developer:  UpToDate (see UpToDate for funding source) Date Released: June 2014  Health Maintenance Due  Topic Date Due  . HIV Screening  07/08/1978  . COLONOSCOPY  07/07/2013  . INFLUENZA VACCINE  10/09/2018    There are no preventive care reminders to display for this patient.  Lab Results  Component Value Date   TSH 0.75 01/19/2018   Lab Results  Component Value Date   WBC 8.0 11/08/2018   HGB 16.8 11/08/2018   HCT 48.2 11/08/2018   MCV 94.8 11/08/2018   PLT 191.0 11/08/2018   Lab Results  Component Value Date   NA 139 11/08/2018   K 3.8 11/08/2018   CO2 34 (H) 11/08/2018   GLUCOSE 99 11/08/2018   BUN 14 11/08/2018   CREATININE 1.15 11/08/2018   BILITOT 0.7 11/08/2018   ALKPHOS 63 11/08/2018   AST 21 11/08/2018   ALT 21 11/08/2018   PROT 7.0 11/08/2018   ALBUMIN 4.2 11/08/2018   CALCIUM 9.8 11/08/2018    ANIONGAP 7 02/17/2017   GFR 65.94 11/08/2018   Lab Results  Component Value Date   CHOL 110 11/08/2018   Lab Results  Component Value Date   HDL 41.90 11/08/2018   Lab Results  Component Value Date   LDLCALC 51 11/08/2018   Lab Results  Component Value Date   TRIG 85.0 11/08/2018   Lab Results  Component Value Date   CHOLHDL 3 11/08/2018   No results found for: HGBA1C    Assessment & Plan:   Problem List Items Addressed This Visit      Cardiovascular and Mediastinum   PAF (paroxysmal atrial fibrillation) (HCC)   PVD (peripheral vascular disease) (HCC)   Relevant Orders   LDL cholesterol, direct (Completed)   Lipid panel (Completed)   Essential hypertension - Primary   Relevant Orders   CBC (Completed)   Comprehensive metabolic panel (Completed)   Urinalysis, Routine w reflex microscopic (Completed)     Respiratory   Reactive airway disease   Relevant Medications   Fluticasone-Salmeterol (ADVAIR DISKUS) 250-50 MCG/DOSE AEPB   predniSONE (DELTASONE) 10 MG tablet   Other Relevant Orders   Ambulatory referral to Pulmonology     Musculoskeletal and Integument   Tick bite   Relevant Orders   B. burgdorfi antibodies (Completed)     Other   Screen for colon cancer   Relevant Orders   Ambulatory referral to Gastroenterology   Tobacco abuse   Relevant Medications   Fluticasone-Salmeterol (ADVAIR DISKUS) 250-50 MCG/DOSE AEPB   predniSONE (DELTASONE) 10 MG tablet   Other Relevant Orders   Ambulatory referral to Pulmonology   Hematuria   Relevant Orders   CT Abdomen Pelvis W Contrast   Urinalysis, Routine w reflex microscopic (Completed)   Ambulatory referral to Urology   Chronic fatigue   Right upper quadrant pain   Relevant Orders   CT Abdomen Pelvis W Contrast    Other Visit Diagnoses    Abnormal weight loss       Relevant Orders   CT Abdomen Pelvis W  Contrast      Meds ordered this encounter  Medications  . Fluticasone-Salmeterol (ADVAIR  DISKUS) 250-50 MCG/DOSE AEPB    Sig: Inhale 1 puff into the lungs 2 (two) times daily.    Dispense:  1 each    Refill:  3  . predniSONE (DELTASONE) 10 MG tablet    Sig: Take 1 tablet (10 mg total) by mouth 2 (two) times daily with a meal for 5 days.    Dispense:  10 tablet    Refill:  0    Follow-up: Return in about 3 months (around 02/07/2019).   Patient was given information on effects of tobacco on the body and steps to quit smoking.  We discussed his need to quit on multiple occasions.  Pulmonology referral for expected diagnosis of COPD and screening CT of his chest.  CT of his abdomen and pelvis regarding his ongoing right upper quadrant pain hematuria and fatigue. He may need urological referral as well.

## 2018-11-08 NOTE — Addendum Note (Signed)
Addended by: Abelino Derrick A on: 11/08/2018 01:01 PM   Modules accepted: Orders

## 2018-11-08 NOTE — Patient Instructions (Signed)
Steps to Quit Smoking Smoking tobacco is the leading cause of preventable death. It can affect almost every organ in the body. Smoking puts you and those around you at risk for developing many serious chronic diseases. Quitting smoking can be difficult, but it is one of the best things that you can do for your health. It is never too late to quit. How do I get ready to quit? When you decide to quit smoking, create a plan to help you succeed. Before you quit:  Pick a date to quit. Set a date within the next 2 weeks to give you time to prepare.  Write down the reasons why you are quitting. Keep this list in places where you will see it often.  Tell your family, friends, and co-workers that you are quitting. Support from your loved ones can make quitting easier.  Talk with your health care provider about your options for quitting smoking.  Find out what treatment options are covered by your health insurance.  Identify people, places, things, and activities that make you want to smoke (triggers). Avoid them. What first steps can I take to quit smoking?  Throw away all cigarettes at home, at work, and in your car.  Throw away smoking accessories, such as Scientist, research (medical).  Clean your car. Make sure to empty the ashtray.  Clean your home, including curtains and carpets. What strategies can I use to quit smoking? Talk with your health care provider about combining strategies, such as taking medicines while you are also receiving in-person counseling. Using these two strategies together makes you more likely to succeed in quitting than if you used either strategy on its own.  If you are pregnant or breastfeeding, talk with your health care provider about finding counseling or other support strategies to quit smoking. Do not take medicine to help you quit smoking unless your health care provider tells you to do so. To quit smoking: Quit right away  Quit smoking completely, instead of  gradually reducing how much you smoke over a period of time. Research shows that stopping smoking right away is more successful than gradually quitting.  Attend in-person counseling to help you build problem-solving skills. You are more likely to succeed in quitting if you attend counseling sessions regularly. Even short sessions of 10 minutes can be effective. Take medicine You may take medicines to help you quit smoking. Some medicines require a prescription and some you can purchase over-the-counter. Medicines may have nicotine in them to replace the nicotine in cigarettes. Medicines may:  Help to stop cravings.  Help to relieve withdrawal symptoms. Your health care provider may recommend:  Nicotine patches, gum, or lozenges.  Nicotine inhalers or sprays.  Non-nicotine medicine that is taken by mouth. Find resources Find resources and support systems that can help you to quit smoking and remain smoke-free after you quit. These resources are most helpful when you use them often. They include:  Online chats with a Social worker.  Telephone quitlines.  Printed Furniture conservator/restorer.  Support groups or group counseling.  Text messaging programs.  Mobile phone apps or applications. Use apps that can help you stick to your quit plan by providing reminders, tips, and encouragement. There are many free apps for mobile devices as well as websites. Examples include Quit Guide from the [employer] and smokefree.gov What things can I do to make it easier to quit?   Reach out to your family and friends for support and encouragement. Call telephone quitlines (1-800-QUIT-NOW), reach  out to support groups, or work with a Social worker for support.  Ask people who smoke to avoid smoking around you.  Avoid places that trigger you to smoke, such as bars, parties, or smoke-break areas at work.  Spend time with people who do not smoke.  Lessen the stress in your life. Stress can be a smoking trigger for some  people. To lessen stress, try: ? Exercising regularly. ? Doing deep-breathing exercises. ? Doing yoga. ? Meditating. ? Performing a body scan. This involves closing your eyes, scanning your body from head to toe, and noticing which parts of your body are particularly tense. Try to relax the muscles in those areas. How will I feel when I quit smoking? Day 1 to 3 weeks Within the first 24 hours of quitting smoking, you may start to feel withdrawal symptoms. These symptoms are usually most noticeable 2-3 days after quitting, but they usually do not last for more than 2-3 weeks. You may experience these symptoms:  Mood swings.  Restlessness, anxiety, or irritability.  Trouble concentrating.  Dizziness.  Strong cravings for sugary foods and nicotine.  Mild weight gain.  Constipation.  Nausea.  Coughing or a sore throat.  Changes in how the medicines that you take for unrelated issues work in your body.  Depression.  Trouble sleeping (insomnia). Week 3 and afterward After the first 2-3 weeks of quitting, you may start to notice more positive results, such as:  Improved sense of smell and taste.  Decreased coughing and sore throat.  Slower heart rate.  Lower blood pressure.  Clearer skin.  The ability to breathe more easily.  Fewer sick days. Quitting smoking can be very challenging. Do not get discouraged if you are not successful the first time. Some people need to make many attempts to quit before they achieve long-term success. Do your best to stick to your quit plan, and talk with your health care provider if you have any questions or concerns. Summary  Smoking tobacco is the leading cause of preventable death. Quitting smoking is one of the best things that you can do for your health.  When you decide to quit smoking, create a plan to help you succeed.  Quit smoking right away, not slowly over a period of time.  When you start quitting, seek help from your  health care provider, family, or friends. This information is not intended to replace advice given to you by your health care provider. Make sure you discuss any questions you have with your health care provider. Document Released: 02/18/2001 Document Revised: 05/14/2018 Document Reviewed: 05/15/2018 Elsevier Patient Education  [place].  Tobacco Use Disorder Tobacco use disorder (TUD) occurs when a person craves, seeks, and uses tobacco, regardless of the consequences. This disorder can cause problems with mental and physical health. It can affect your ability to have healthy relationships, and it can keep you from meeting your responsibilities at work, home, or school. Tobacco may be:  Smoked as a cigarette or cigar.  Inhaled using e-cigarettes.  Smoked in a pipe or hookah.  Chewed as smokeless tobacco.  Inhaled into the nostrils as snuff. Tobacco products contain a dangerous chemical called nicotine, which is very addictive. Nicotine triggers hormones that make the body feel stimulated and works on areas of the brain that make you feel good. These effects can make it hard for people to quit nicotine. Tobacco contains many other unsafe chemicals that can damage almost every organ in the body. Smoking tobacco also puts  others in danger due to fire risk and possible health problems caused by breathing in secondhand smoke. What are the signs or symptoms? Symptoms of TUD may include:  Being unable to slow down or stop your tobacco use.  Spending an abnormal amount of time getting or using tobacco.  Craving tobacco. Cravings may last for up to 6 months after quitting.  Tobacco use that: ? Interferes with your work, school, or home life. ? Interferes with your personal and social relationships. ? Makes you give up activities that you once enjoyed or found important.  Using tobacco even though you know that it is: ? Dangerous or bad for your health or someone else's  health. ? Causing problems in your life.  Needing more and more of the substance to get the same effect (developing tolerance).  Experiencing unpleasant symptoms if you do not use the substance (withdrawal). Withdrawal symptoms may include: ? Depressed, anxious, or irritable mood. ? Difficulty concentrating. ? Increased appetite. ? Restlessness or trouble sleeping.  Using the substance to avoid withdrawal. How is this diagnosed? This condition may be diagnosed based on:  Your current and past tobacco use. Your health care provider may ask questions about how your tobacco use affects your life.  A physical exam. You may be diagnosed with TUD if you have at least two symptoms within a 31-monthperiod. How is this treated? This condition is treated by stopping tobacco use. Many people are unable to quit on their own and need help. Treatment may include:  Nicotine replacement therapy (NRT). NRT provides nicotine without the other harmful chemicals in tobacco. NRT gradually lowers the dosage of nicotine in the body and reduces withdrawal symptoms. NRT is available as: ? Over-the-counter gums, lozenges, and skin patches. ? Prescription mouth inhalers and nasal sprays.  Medicine that acts on the brain to reduce cravings and withdrawal symptoms.  A type of talk therapy that examines your triggers for tobacco use, how to avoid them, and how to cope with cravings (behavioral therapy).  Hypnosis. This may help with withdrawal symptoms.  Joining a support group for others coping with TUD. The best treatment for TUD is usually a combination of medicine, talk therapy, and support groups. Recovery can be a long process. Many people start using tobacco again after stopping (relapse). If you relapse, it does not mean that treatment will not work. Follow these instructions at home:  Lifestyle  Do not use any products that contain nicotine or tobacco, such as cigarettes and e-cigarettes.  Avoid  things that trigger tobacco use as much as you can. Triggers include people and situations that usually cause you to use tobacco.  Avoid drinks that contain caffeine, including coffee. These may worsen some withdrawal symptoms.  Find ways to manage stress. Wanting to smoke may cause stress, and stress can make you want to smoke. Relaxation techniques such as deep breathing, meditation, and yoga may help.  Attend support groups as needed. These groups are an important part of long-term recovery for many people. General instructions  Take over-the-counter and prescription medicines only as told by your health care provider.  Check with your health care provider before taking any new prescription or over-the-counter medicines.  Decide on a friend, family member, or smoking quit-line (such as 1-800-QUIT-NOW in the [place] that you can call or text when you feel the urge to smoke or when you need help coping with cravings.  Keep all follow-up visits as told by your health care provider and therapist.  This is important. Contact a health care provider if:  You are not able to take your medicines as prescribed.  Your symptoms get worse, even with treatment. Summary  Tobacco use disorder (TUD) occurs when a person craves, seeks, and uses tobacco regardless of the consequences.  This condition may be diagnosed based on your current and past tobacco use and a physical exam.  Many people are unable to quit on their own and need help. Recovery can be a long process.  The most effective treatment for TUD is usually a combination of medicine, talk therapy, and support groups. This information is not intended to replace advice given to you by your health care provider. Make sure you discuss any questions you have with your health care provider. Document Released: 10/31/2003 Document Revised: 02/11/2017 Document Reviewed: 02/11/2017 Elsevier Patient Education  [place].  Fatigue If you have  fatigue, you feel tired all the time and have a lack of energy or a lack of motivation. Fatigue may make it difficult to start or complete tasks because of exhaustion. In general, occasional or mild fatigue is often a normal response to activity or life. However, long-lasting (chronic) or extreme fatigue may be a symptom of a medical condition. Follow these instructions at home: General instructions  Watch your fatigue for any changes.  Go to bed and get up at the same time every day.  Avoid fatigue by pacing yourself during the day and getting enough sleep at night.  Maintain a healthy weight. Medicines  Take over-the-counter and prescription medicines only as told by your health care provider.  Take a multivitamin, if told by your health care provider.  Do not use herbal or dietary supplements unless they are approved by your health care provider. Activity   Exercise regularly, as told by your health care provider.  Use or practice techniques to help you relax, such as yoga, tai chi, meditation, or massage therapy. Eating and drinking   Avoid heavy meals in the evening.  Eat a well-balanced diet, which includes lean proteins, whole grains, plenty of fruits and vegetables, and low-fat dairy products.  Avoid consuming too much caffeine.  Avoid the use of alcohol.  Drink enough fluid to keep your urine pale yellow. Lifestyle  Change situations that cause you stress. Try to keep your work and personal schedule in balance.  Do not use any products that contain nicotine or tobacco, such as cigarettes and e-cigarettes. If you need help quitting, ask your health care provider.  Do not use drugs. Contact a health care provider if:  Your fatigue does not get better.  You have a fever.  You suddenly lose or gain weight.  You have headaches.  You have trouble falling asleep or sleeping through the night.  You feel angry, guilty, anxious, or sad.  You are unable to have  a bowel movement (constipation).  Your skin is dry.  You have swelling in your legs or another part of your body. Get help right away if:  You feel confused.  Your vision is blurry.  You feel faint or you pass out.  You have a severe headache.  You have severe pain in your abdomen, your back, or the area between your waist and hips (pelvis).  You have chest pain, shortness of breath, or an irregular or fast heartbeat.  You are unable to urinate, or you urinate less than normal.  You have abnormal bleeding, such as bleeding from the rectum, vagina, nose, lungs, or  nipples.  You vomit blood.  You have thoughts about hurting yourself or others. If you ever feel like you may hurt yourself or others, or have thoughts about taking your own life, get help right away. You can go to your nearest emergency department or call:  Your local emergency services (911 in the [place]  A suicide crisis helpline, such as the [facility] at 816-056-1090. This is open 24 hours a day. Summary  If you have fatigue, you feel tired all the time and have a lack of energy or a lack of motivation.  Fatigue may make it difficult to start or complete tasks because of exhaustion.  Long-lasting (chronic) or extreme fatigue may be a symptom of a medical condition.  Exercise regularly, as told by your health care provider.  Change situations that cause you stress. Try to keep your work and personal schedule in balance. This information is not intended to replace advice given to you by your health care provider. Make sure you discuss any questions you have with your health care provider. Document Released: 12/22/2006 Document Revised: 06/17/2018 Document Reviewed: 11/19/2016 Elsevier Patient Education  2020 [employer].

## 2018-11-08 NOTE — Addendum Note (Signed)
Addended by: Nathanial Millman E on: 11/08/2018 12:02 PM   Modules accepted: Orders

## 2018-11-09 LAB — B. BURGDORFI ANTIBODIES: B burgdorferi Ab IgG+IgM: <0.9 index

## 2018-11-09 NOTE — Addendum Note (Signed)
Addended by: Abelino Derrick A on: 11/09/2018 11:29 AM   Modules accepted: Orders

## 2018-11-12 ENCOUNTER — Encounter: Payer: Self-pay | Admitting: Family Medicine

## 2018-11-12 MED ORDER — ALBUTEROL SULFATE HFA 108 (90 BASE) MCG/ACT IN AERS: INHALATION_SPRAY | RESPIRATORY_TRACT | 3 refills | Status: DC

## 2018-11-18 ENCOUNTER — Other Ambulatory Visit: Payer: Self-pay

## 2018-11-18 ENCOUNTER — Emergency Department (HOSPITAL_COMMUNITY)
Admission: EM | Admit: 2018-11-18 | Discharge: 2018-11-19 | Disposition: A | Discharge status: Home / Self Care | Payer: BC Managed Care – PPO | Attending: Emergency Medicine | Admitting: Emergency Medicine

## 2018-11-18 DIAGNOSIS — R5383 Other fatigue: Secondary | ICD-10-CM | POA: Diagnosis not present

## 2018-11-18 DIAGNOSIS — Z91010 Allergy to peanuts: Secondary | ICD-10-CM | POA: Diagnosis not present

## 2018-11-18 DIAGNOSIS — Z8673 Personal history of transient ischemic attack (TIA), and cerebral infarction without residual deficits: Secondary | ICD-10-CM | POA: Insufficient documentation

## 2018-11-18 DIAGNOSIS — Z79899 Other long term (current) drug therapy: Secondary | ICD-10-CM | POA: Insufficient documentation

## 2018-11-18 DIAGNOSIS — R079 Chest pain, unspecified: Secondary | ICD-10-CM | POA: Insufficient documentation

## 2018-11-18 DIAGNOSIS — Z7901 Long term (current) use of anticoagulants: Secondary | ICD-10-CM | POA: Diagnosis not present

## 2018-11-18 DIAGNOSIS — R1011 Right upper quadrant pain: Secondary | ICD-10-CM | POA: Diagnosis not present

## 2018-11-18 DIAGNOSIS — F17210 Nicotine dependence, cigarettes, uncomplicated: Secondary | ICD-10-CM | POA: Insufficient documentation

## 2018-11-19 ENCOUNTER — Other Ambulatory Visit: Payer: Self-pay

## 2018-11-19 ENCOUNTER — Emergency Department (HOSPITAL_COMMUNITY): Payer: BC Managed Care – PPO

## 2018-11-19 ENCOUNTER — Encounter (HOSPITAL_COMMUNITY): Payer: Self-pay | Admitting: Emergency Medicine

## 2018-11-19 DIAGNOSIS — R079 Chest pain, unspecified: Secondary | ICD-10-CM | POA: Diagnosis not present

## 2018-11-19 DIAGNOSIS — R1011 Right upper quadrant pain: Secondary | ICD-10-CM | POA: Diagnosis not present

## 2018-11-19 LAB — LIPASE, BLOOD: Lipase: 31 U/L (ref 11–51)

## 2018-11-19 LAB — HEPATIC FUNCTION PANEL
ALT: 21 U/L (ref 0–44)
AST: 20 U/L (ref 15–41)
Albumin: 3.8 g/dL (ref 3.5–5.0)
Alkaline Phosphatase: 47 U/L (ref 38–126)
Bilirubin, Direct: <0.1 mg/dL (ref 0.0–0.2)
Total Bilirubin: 0.5 mg/dL (ref 0.3–1.2)
Total Protein: 6.6 g/dL (ref 6.5–8.1)

## 2018-11-19 LAB — BASIC METABOLIC PANEL
Anion gap: 10 (ref 5–15)
BUN: 15 mg/dL (ref 6–20)
CO2: 29 mmol/L (ref 22–32)
Calcium: 9.6 mg/dL (ref 8.9–10.3)
Chloride: 99 mmol/L (ref 98–111)
Creatinine, Ser: 1.31 mg/dL — ABNORMAL HIGH (ref 0.61–1.24)
GFR calc Af Amer: >60 mL/min (ref >60)
GFR calc non Af Amer: >60 mL/min (ref >60)
Glucose, Bld: 116 mg/dL — ABNORMAL HIGH (ref 70–99)
Potassium: 3.1 mmol/L — ABNORMAL LOW (ref 3.5–5.1)
Sodium: 138 mmol/L (ref 135–145)

## 2018-11-19 LAB — CBC
HCT: 47.7 % (ref 39.0–52.0)
Hemoglobin: 16.6 g/dL (ref 13.0–17.0)
MCH: 33.1 pg (ref 26.0–34.0)
MCHC: 34.8 g/dL (ref 30.0–36.0)
MCV: 95 fL (ref 80.0–100.0)
Platelets: 230 10*3/uL (ref 150–400)
RBC: 5.02 MIL/uL (ref 4.22–5.81)
RDW: 12.5 % (ref 11.5–15.5)
WBC: 9.8 10*3/uL (ref 4.0–10.5)
nRBC: 0 % (ref 0.0–0.2)

## 2018-11-19 LAB — PROTIME-INR
INR: 9.7 (ref 0.8–1.2)
INR: 2.1 — ABNORMAL HIGH (ref 0.8–1.2)
Prothrombin Time: 76 seconds — ABNORMAL HIGH (ref 11.4–15.2)
Prothrombin Time: 23.6 seconds — ABNORMAL HIGH (ref 11.4–15.2)

## 2018-11-19 LAB — CK: Total CK: 133 U/L (ref 49–397)

## 2018-11-19 LAB — TROPONIN I (HIGH SENSITIVITY)
Troponin I (High Sensitivity): 4 ng/L (ref <18)
Troponin I (High Sensitivity): 5 ng/L (ref <18)

## 2018-11-19 NOTE — ED Notes (Signed)
Patient amulated to the restroom and requested something to drink.

## 2018-11-19 NOTE — ED Notes (Signed)
Pt transported to [facility]

## 2018-11-19 NOTE — ED Notes (Signed)
Lab called with Crit INR result. Told to call charge for conformation. Comment placed. Risk manager.

## 2018-11-19 NOTE — ED Provider Notes (Signed)
[facility] EMERGENCY DEPARTMENT Provider Note   CSN: [id number] Arrival date & time: 11/18/18  2340     History   Chief Complaint Chief Complaint  Patient presents with   Chest Pain    HPI TARA WICH is a 53 y.o. male.     HPI  This is a 53 year old male with a history of atrial fibrillation on Xarelto, stroke who presents with generalized fatigue, cramps, pursed prandial symptoms, and left arm tingling.  Patient reports that over the last several months he has had worsening postprandial symptoms that include whole body flushing sensation, occasional right upper quadrant pain and epigastric pain.  He states "I am not sure if it was my blood sugar."  He also reports generalized fatigue and "I just do not feel well."  He has had leg cramping.  Denies any recent significant exertion.  Per triage note, patient had chest discomfort earlier this evening; however, patient states that he did not really have chest discomfort but more tingling in the left arm.  He has never had a thing like that before.  Denies any recent coughs or fevers.  Denies any sick contacts.  Denies any nausea, vomiting, diarrhea.  Past Medical History:  Diagnosis Date   A-fib [facility])    Allergy    Atrial fibrillation ([place])    Neuromuscular disorder ([place])    Stroke [facility])     Patient Active Problem List   Diagnosis Date Noted   Chronic fatigue 11/08/2018   Reactive airway disease 11/08/2018   Right upper quadrant pain 11/08/2018   Tick bite 11/08/2018   Cellulitis of right upper extremity 08/03/2018   Plantar wart 08/03/2018   Erectile dysfunction due to arterial insufficiency 06/03/2018   Essential hypertension 02/03/2018   Hematuria 02/03/2018   Screen for colon cancer 01/19/2018   Pain of right hip joint 01/19/2018   Oral lesion 01/19/2018   Acute maxillary sinusitis 01/19/2018   Tobacco abuse 01/19/2018   PVD (peripheral vascular disease) ([place]) 01/19/2018     Heme positive stool 01/19/2018   Epigastric pain 01/19/2018   History of CVA (cerebrovascular accident) 01/19/2018   Asymptomatic microscopic hematuria 01/19/2018   PAF (paroxysmal atrial fibrillation) ([place]) 67/02/4579   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 Authorizing Provider  albuterol (VENTOLIN HFA) 108 (90 Base) MCG/ACT inhaler INHALE TWO PUFFS BY MOUTH EVERY 6 HOURS AS NEEDED FOR WHEEZING Patient taking differently: Inhale 1-2 puffs into the lungs every 6 (six) hours as needed for wheezing or shortness of breath.  11/12/18  Yes Libby Maw, MD  atorvastatin (LIPITOR) 10 MG tablet Take 1 tablet (10 mg total) by mouth daily. 08/03/18  Yes Libby Maw, MD  diltiazem (CARDIZEM CD) 180 MG 24 hr capsule Take 1 capsule (180 mg total) by mouth daily. 08/03/18  Yes Libby Maw, MD  hydrochlorothiazide (HYDRODIURIL) 25 MG tablet Take 1 tablet (25 mg total) by mouth daily. 08/04/18  Yes Libby Maw, MD  rivaroxaban (XARELTO) 20 MG TABS tablet Take 1 tablet (20 mg total) by mouth daily. 01/19/18  Yes Libby Maw, MD  sildenafil (REVATIO) 20 MG tablet Take 3 to 5 pills 45 minutes prior Patient taking  differently: Take 60-80 mg by mouth daily as needed (ED).  06/03/18  Yes Mliss SaxKremer, William Alfred, MD  nicotine (NICODERM CQ) 21 mg/24hr patch Place 1 patch (21 mg total) onto the skin daily. Patient not taking: Reported on 11/19/2018 05/31/18   Mliss SaxKremer, William Alfred, MD    Family History Family History  Problem Relation Age of Onset   CVA Father     Social History Social History   Tobacco Use   Smoking status: Current Every Day Smoker    Packs/day: 1.50    Types: Cigarettes    Smokeless tobacco: Never Used  Substance Use Topics   Alcohol use: No    Alcohol/week: 0.0 standard drinks   Drug use: No     Allergies   Flexeril [cyclobenzaprine], Peanut-containing drug products, Grapefruit bioflavonoid complex, Other, and Prednisone   Review of Systems Review of Systems  Constitutional: Positive for fatigue. Negative for fever and unexpected weight change.  Respiratory: Negative for cough and shortness of breath.   Cardiovascular: Negative for chest pain.  Gastrointestinal: Positive for abdominal pain. Negative for diarrhea, nausea and vomiting.  Neurological: Positive for numbness. Negative for headaches.  All other systems reviewed and are negative.    Physical Exam Updated Vital Signs BP 117/70 (BP Location: Right Arm)    Pulse (!) 53    Resp 13    Ht 1.727 m (5\' 8" )    Wt 72.5 kg    SpO2 96%    BMI 24.30 kg/m   Physical Exam Vitals signs and nursing note reviewed.  Constitutional:      Appearance: He is well-developed. He is not ill-appearing.  HENT:     Head: Normocephalic and atraumatic.  Neck:     Musculoskeletal: Neck supple.  Cardiovascular:     Rate and Rhythm: Normal rate and regular rhythm.     Heart sounds: Normal heart sounds. No murmur.  Pulmonary:     Effort: Pulmonary effort is normal. No respiratory distress.     Breath sounds: Normal breath sounds. No wheezing.  Abdominal:     General: Bowel sounds are normal.     Palpations: Abdomen is soft.     Tenderness: There is no abdominal tenderness. There is no rebound.  Musculoskeletal:     Right lower leg: He exhibits no tenderness.  Lymphadenopathy:     Cervical: No cervical adenopathy.  Skin:    General: Skin is warm and dry.  Neurological:     Mental Status: He is alert and oriented to person, place, and time.  Psychiatric:        Mood and Affect: Mood normal.      ED Treatments / Results  Labs (all labs ordered are listed, but only abnormal results are  displayed) Labs Reviewed  BASIC METABOLIC PANEL - Abnormal; Notable for the following components:      Result Value   Potassium 3.1 (*)    Glucose, Bld 116 (*)    Creatinine, Ser 1.31 (*)    All other components within normal limits  PROTIME-INR - Abnormal; Notable for the following components:   Prothrombin Time 76.0 (*)    INR 9.7 (*)    All other components within normal limits  PROTIME-INR - Abnormal; Notable for the following components:   Prothrombin Time 23.6 (*)    INR 2.1 (*)    All other components within normal limits  CBC  HEPATIC FUNCTION PANEL  LIPASE, BLOOD  CK  TROPONIN I (HIGH SENSITIVITY)  TROPONIN I (HIGH SENSITIVITY)  EKG EKG Interpretation  Date/Time:  Friday November 19 2018 05:21:35 EDT Ventricular Rate:  56 PR Interval:    QRS Duration: 112 QT Interval:  437 QTC Calculation: 422 R Axis:   -90 Text Interpretation:  Sinus rhythm LAD, consider left anterior fascicular block Confirmed by Ross Marcus ([id number]) on 11/19/2018 5:37:04 AM   Radiology Dg Chest 2 View  Result Date: 11/19/2018 CLINICAL DATA:  Chest pain EXAM: CHEST - 2 VIEW COMPARISON:  02/17/2017 FINDINGS: Heart and mediastinal contours are within normal limits. No focal opacities or effusions. No acute bony abnormality. IMPRESSION: No active cardiopulmonary disease. Electronically Signed   By: Charlett Nose M.D.   On: 11/19/2018 00:37   US Abdomen Limited Ruq  Result Date: 11/19/2018 CLINICAL DATA:  Right upper quadrant pain EXAM: ULTRASOUND ABDOMEN LIMITED RIGHT UPPER QUADRANT COMPARISON:  None. FINDINGS: Gallbladder: No gallstones or wall thickening visualized. No sonographic Murphy sign noted by sonographer. Common bile duct: Diameter: 6 mm Liver: Prominent liver echogenicity. No focal lesion. Portal vein is patent on color Doppler imaging with normal direction of blood flow towards the liver. IMPRESSION: 1. Probable mild hepatic steatosis. 2. Otherwise negative.  No cholelithiasis.  Electronically Signed   By: Marnee Spring M.D.   On: 11/19/2018 05:03    Procedures Procedures (including critical care time)  Medications Ordered in ED Medications - No data to display   Initial Impression / Assessment and Plan / ED Course  I have reviewed the triage vital signs and the nursing notes.  Pertinent labs & imaging results that were available during my care of the patient were reviewed by me and considered in my medical decision making (see chart for details).        Patient presents with multiple complaints including postprandial pain, fatigue, body aches.  He is overall nontoxic-appearing and vital signs are largely reassuring.  He relates most of his symptoms after eating.  He denies any chest pain to me but did have some left arm tingling.  EKG is nonischemic.  Troponin x2-.  Initial INR elevated at greater than 9.  He is on Xarelto.  Will repeat as I suspect this is a lab error.  Added on hepatic function, lipase, CK.  These are all normal.  Right upper quadrant ultrasound without evidence of cholecystitis or gallstones.  Work-up is largely reassuring.  We will have him follow back up with his primary physician.  He may need GI evaluation if he continues to have postprandial symptoms.  After history, exam, and medical workup I feel the patient has been appropriately medically screened and is safe for discharge home. Pertinent diagnoses were discussed with the patient. Patient was given return precautions.   Final Clinical Impressions(s) / ED Diagnoses   Final diagnoses:  RUQ pain  Other fatigue    ED Discharge Orders    None       Shon Baton, MD 11/19/18 [phone]

## 2018-11-19 NOTE — Discharge Instructions (Addendum)
You were seen today for multiple complaints.  Your work-up today is reassuring.  Follow-up closely with your primary doctor.

## 2018-11-19 NOTE — ED Triage Notes (Signed)
Patient reports left side chest discomfort/pressure with left arm tingling this evening , denies SOB , no  nausea or diaphoresis , patient added bilateral leg cramps and fatigue .

## 2018-12-09 ENCOUNTER — Encounter: Payer: Self-pay | Admitting: Family Medicine

## 2019-01-28 ENCOUNTER — Encounter: Payer: Self-pay | Admitting: Family Medicine

## 2019-01-28 ENCOUNTER — Other Ambulatory Visit: Payer: Self-pay | Admitting: Family Medicine

## 2019-01-28 DIAGNOSIS — I480 Paroxysmal atrial fibrillation: Secondary | ICD-10-CM

## 2019-03-14 ENCOUNTER — Other Ambulatory Visit: Payer: Self-pay | Admitting: Family Medicine

## 2019-03-14 DIAGNOSIS — I480 Paroxysmal atrial fibrillation: Secondary | ICD-10-CM

## 2019-03-29 ENCOUNTER — Other Ambulatory Visit: Payer: Self-pay | Admitting: Family Medicine

## 2019-03-29 ENCOUNTER — Encounter: Payer: Self-pay | Admitting: Family Medicine

## 2019-03-29 DIAGNOSIS — I739 Peripheral vascular disease, unspecified: Secondary | ICD-10-CM

## 2019-03-29 NOTE — Telephone Encounter (Signed)
Last OV 11/08/18 Last fill 08/03/18  #90/1

## 2019-04-12 ENCOUNTER — Encounter: Payer: Self-pay | Admitting: Family Medicine

## 2019-04-14 ENCOUNTER — Ambulatory Visit: Payer: Self-pay | Admitting: Gastroenterology

## 2019-04-15 ENCOUNTER — Encounter: Payer: Self-pay | Admitting: Family Medicine

## 2019-05-02 ENCOUNTER — Ambulatory Visit: Payer: BC Managed Care – PPO | Admitting: Family Medicine

## 2019-05-19 ENCOUNTER — Telehealth: Payer: Self-pay | Admitting: Family Medicine

## 2019-05-19 NOTE — Telephone Encounter (Signed)
LVM re: Need to reschedule annual exam cancelled from 05/02/19.

## 2019-06-07 ENCOUNTER — Other Ambulatory Visit: Payer: Self-pay | Admitting: Family Medicine

## 2019-06-07 ENCOUNTER — Telehealth: Payer: Self-pay | Admitting: Family Medicine

## 2019-06-07 DIAGNOSIS — I480 Paroxysmal atrial fibrillation: Secondary | ICD-10-CM

## 2019-06-07 DIAGNOSIS — Z8673 Personal history of transient ischemic attack (TIA), and cerebral infarction without residual deficits: Secondary | ICD-10-CM

## 2019-06-07 NOTE — Telephone Encounter (Signed)
LVM for patient RE: Received Mychart request to schedule patient for a physical on a Monday morning.

## 2019-06-09 NOTE — Telephone Encounter (Signed)
Pt has follow up appointment scheduled.

## 2019-06-17 ENCOUNTER — Other Ambulatory Visit: Payer: Self-pay

## 2019-06-17 ENCOUNTER — Ambulatory Visit (INDEPENDENT_AMBULATORY_CARE_PROVIDER_SITE_OTHER): Payer: BC Managed Care – PPO | Admitting: Ophthalmology

## 2019-06-17 ENCOUNTER — Encounter (INDEPENDENT_AMBULATORY_CARE_PROVIDER_SITE_OTHER): Payer: Self-pay | Admitting: Ophthalmology

## 2019-06-17 DIAGNOSIS — G43109 Migraine with aura, not intractable, without status migrainosus: Secondary | ICD-10-CM

## 2019-06-17 DIAGNOSIS — H3581 Retinal edema: Secondary | ICD-10-CM

## 2019-06-17 DIAGNOSIS — H35033 Hypertensive retinopathy, bilateral: Secondary | ICD-10-CM

## 2019-06-17 DIAGNOSIS — I10 Essential (primary) hypertension: Secondary | ICD-10-CM

## 2019-06-17 DIAGNOSIS — H25813 Combined forms of age-related cataract, bilateral: Secondary | ICD-10-CM

## 2019-06-17 NOTE — Progress Notes (Signed)
[facility] - Clinic Note  06/17/2019     CHIEF COMPLAINT Patient presents for Retina Evaluation   HISTORY OF PRESENT ILLNESS: Frank Silva is a 54 y.o. male who presents to the clinic today for:   HPI    Retina Evaluation    In both eyes.  This started 1 day ago.  Duration of 1 day.  Associated Symptoms Flashes.  Context:  distance vision, mid-range vision and near vision.  Treatments tried include no treatments.  I, the attending physician,  performed the HPI with the patient and updated documentation appropriately.          Comments    53 y/o male pt referred yesterday by Dr. Laveda Norman at [facility] in [place], [place].  Pt called Dr. Laveda Norman because he has been having what he felt like were ocular migraines for about a year, but they have recently been getting more frequent.  Pt had an episode on 06/11/19, and another one yesterday.  Pt reports symptoms are sudden onset OU, and begin with temporal flashes and auras that look like "crescent moons" superiorly, and that all of a sudden his vision looks like "shutters are opening and closing."  Symptoms only last for a few seconds, and resolve on their own.  More noticeable in dim lighting.  No pain, headaches.  Pt also has a constant "gray spot" in his ST vision OD.  Pt states he has been told he has choroidal nevi.  Last CEE 08/03/18.  Hx of stroke in 2016.  No gtts.  Pt very anxious.       Last edited by Rennis Chris, MD on 06/17/2019  9:22 AM. (History)    pt states he was referred here by Dr. Laveda Norman at [facility] [address] pt states he has been having what he thinks are ocular migraines, he states he was watching TV last night and noticed a blurry spot in his vision that got larger and and he started to see jagged lights until it eventually traveled out of his field of vision, he states he had an ocular migraine in his left eye on April 3 that left "permanent" crescent moon shaped floaters, pt takes medication for Afib, high blood pressure  and cholesterol, pt has hx of stroke  Referring physician: Derryl Harbor, OD [address] [place],  [place] [address]  HISTORICAL INFORMATION:   Selected notes from the MEDICAL RECORD NUMBER Referred by Dr. Laveda Norman at [facility] for retina eval, history of ocular migraine   CURRENT MEDICATIONS: No current outpatient medications on file. (Ophthalmic Drugs)   No current facility-administered medications for this visit. (Ophthalmic Drugs)   Current Outpatient Medications (Other)  Medication Sig  . albuterol (VENTOLIN HFA) 108 (90 Base) MCG/ACT inhaler INHALE TWO PUFFS BY MOUTH EVERY 6 HOURS AS NEEDED FOR WHEEZING (Patient taking differently: Inhale 1-2 puffs into the lungs every 6 (six) hours as needed for wheezing or shortness of breath. )  . atorvastatin (LIPITOR) 10 MG tablet Take 1 tablet by mouth once daily  . diltiazem (CARDIZEM CD) 180 MG 24 hr capsule Take 1 capsule by mouth once daily  . diltiazem (TIAZAC) 180 MG 24 hr capsule Take by mouth.  . hydrochlorothiazide (HYDRODIURIL) 25 MG tablet Take 1 tablet (25 mg total) by mouth daily.  . nicotine (NICODERM CQ) 21 mg/24hr patch Place 1 patch (21 mg total) onto the skin daily.  . sildenafil (REVATIO) 20 MG tablet Take 3 to 5 pills 45 minutes prior (Patient taking differently:  Take 60-80 mg by mouth daily as needed (ED). )  . XARELTO 20 MG TABS tablet TAKE 1 TABLET BY MOUTH ONCE DAILY . APPOINTMENT REQUIRED FOR FUTURE REFILLS   No current facility-administered medications for this visit. (Other)      REVIEW OF SYSTEMS: ROS    Positive for: Neurological, Cardiovascular, Eyes, Respiratory   Negative for: Constitutional, Gastrointestinal, Skin, Genitourinary, Musculoskeletal, HENT, Endocrine, Psychiatric, Allergic/Imm, Heme/Lymph   Last edited by Celine Mans, COA on 06/17/2019  8:53 AM. (History)       ALLERGIES Allergies  Allergen Reactions  . Flexeril [Cyclobenzaprine] Other (See Comments)    Heart race  . Peanut-Containing Drug  Products Hives and Shortness Of Breath  . Grapefruit Bioflavonoid Complex Other (See Comments)    Interaction with medication  . Other     Paprika  . Prednisone Other (See Comments)    Tremors    PAST MEDICAL HISTORY Past Medical History:  Diagnosis Date  . A-fib ([facility])   . Allergy   . Atrial fibrillation ([facility])   . Neuromuscular disorder ([facility])   . Stroke [facility])    Past Surgical History:  Procedure Laterality Date  . CARDIAC CATHETERIZATION      FAMILY HISTORY Family History  Problem Relation Age of Onset  . CVA Father     SOCIAL HISTORY Social History   Tobacco Use  . Smoking status: Current Every Day Smoker    Packs/day: 1.50    Types: Cigarettes  . Smokeless tobacco: Never Used  Substance Use Topics  . Alcohol use: No    Alcohol/week: 0.0 standard drinks  . Drug use: No         OPHTHALMIC EXAM:  Base Eye Exam    Visual Acuity (Snellen - Linear)      Right Left   Dist cc 20/20 20/20   Correction: Glasses       Tonometry (Tonopen, 8:58 AM)      Right Left   Pressure 14 15       Pupils      Dark Light Shape React APD   Right 3 2 Round Brisk None   Left 3 2 Round Brisk None       Visual Fields (Counting fingers)      Left Right    Full Full       Extraocular Movement      Right Left    Full, Ortho Full, Ortho       Neuro/Psych    Oriented x3: Yes   Mood/Affect: Normal       Dilation    Both eyes: 1.0% Mydriacyl, 2.5% Phenylephrine @ 8:58 AM        Slit Lamp and Fundus Exam    Slit Lamp Exam      Right Left   Lids/Lashes Dermatochalasis - upper lid, mild Meibomian gland dysfunction Dermatochalasis - upper lid, mild Meibomian gland dysfunction   Conjunctiva/Sclera White and quiet White and quiet   Cornea Clear Clear   Anterior Chamber Deep and quiet Deep and quiet   Iris Round and dilated Round and dilated   Lens 2+ Nuclear sclerosis, 2+ Cortical cataract 2+ Nuclear sclerosis, 2+ Cortical cataract   Vitreous Vitreous syneresis  Vitreous syneresis       Fundus Exam      Right Left   Disc Pink and Sharp Pink and Sharp   C/D Ratio 0.2 0.2   Macula Flat, Good foveal reflex, mild Retinal pigment epithelial mottling Flat, Good foveal reflex,  mild Retinal pigment epithelial mottling   Vessels Mild Vascular attenuation, mild Tortuousity Mild Vascular attenuation, mild Tortuousity   Periphery Attached    Attached           Refraction    Wearing Rx      Sphere Cylinder Axis Add   Right +3.00 +1.00 165 +2.00   Left +3.00 Sphere  +2.00   Age: 52yr   Type: PAL       Manifest Refraction      Sphere Cylinder Axis Dist VA   Right +3.00 +1.00 170 20/20   Left +3.00 Sphere  20/20          IMAGING AND PROCEDURES  Imaging and Procedures for @TODAY @  OCT, Retina - OU - Both Eyes       Right Eye Quality was good. Central Foveal Thickness: 303. Progression has no prior data. Findings include normal foveal contour, no IRF, no SRF.   Left Eye Quality was good. Central Foveal Thickness: 293. Progression has no prior data. Findings include normal foveal contour, no IRF, no SRF.   Notes *Images captured and stored on drive  Diagnosis / Impression:  NFP, no IRF/SRF OU  Clinical management:  See below  Abbreviations: NFP - Normal foveal profile. CME - cystoid macular edema. PED - pigment epithelial detachment. IRF - intraretinal fluid. SRF - subretinal fluid. EZ - ellipsoid zone. ERM - epiretinal membrane. ORA - outer retinal atrophy. ORT - outer retinal tubulation. SRHM - subretinal hyper-reflective material                 ASSESSMENT/PLAN:    ICD-10-CM   1. Ocular migraine  G43.109   2. Retinal edema  H35.81 OCT, Retina - OU - Both Eyes  3. Essential hypertension  I10   4. Hypertensive retinopathy of both eyes  H35.033   5. Combined forms of age-related cataract of both eyes  H25.813     1. Ocular Migraine  - pt reports history of blurred vision with zig zag and crescent objects in vision,  lasting minutes  - dilated exam with no ocular findings to explain symptoms  - BCVA 20/20 OU  - discussed findings, diagnosis and recommend further evaluation with Neurologist / headache specialist  - pt reports having a Neurologist due to history of stroke  - f/u here prn  2. No retinal edema on exam or OCT  3,4. Hypertensive retinopathy OU  - discussed importance of tight BP control  - monitor  5. Mixed form age related cataracts OU  - The symptoms of cataract, surgical options, and treatments and risks were discussed with patient.  - discussed diagnosis and progression  - not yet visually significant  - monitor for now    Ophthalmic Meds Ordered this visit:  No orders of the defined types were placed in this encounter.      Return if symptoms worsen or fail to improve.  There are no Patient Instructions on file for this visit.   Explained the diagnoses, plan, and follow up with the patient and they expressed understanding.  Patient expressed understanding of the importance of proper follow up care.   This document serves as a record of services personally performed by 07[birth year]01[birth year]2001, MD, PhD. It was created on their behalf by Karie Chimera, OA, an ophthalmic assistant. The creation of this record is the provider's dictation and/or activities during the visit.    Electronically signed by: Laurian Brim, OA 04.09.2021 10:21 PM   06.09.2021.  Coralyn Pear, M.D., Ph.D. Diseases & Surgery of the Retina and [place]  I have reviewed the above documentation for accuracy and completeness, and I agree with the above. Gardiner Sleeper, M.D., Ph.D. 06/19/19 10:21 PM   Abbreviations: M myopia (nearsighted); A astigmatism; H hyperopia (farsighted); P presbyopia; Mrx spectacle prescription;  CTL contact lenses; OD right eye; OS left eye; OU both eyes  XT exotropia; ET esotropia; PEK punctate epithelial keratitis; PEE punctate epithelial erosions; DES dry eye  syndrome; MGD meibomian gland dysfunction; ATs artificial tears; PFAT's preservative free artificial tears; [place] nuclear sclerotic cataract; PSC posterior subcapsular cataract; ERM epi-retinal membrane; PVD posterior vitreous detachment; RD retinal detachment; DM diabetes mellitus; DR diabetic retinopathy; NPDR non-proliferative diabetic retinopathy; PDR proliferative diabetic retinopathy; CSME clinically significant macular edema; DME diabetic macular edema; dbh dot blot hemorrhages; CWS cotton wool spot; POAG primary open angle glaucoma; C/D cup-to-disc ratio; HVF humphrey visual field; GVF goldmann visual field; OCT optical coherence tomography; IOP intraocular pressure; BRVO Branch retinal vein occlusion; CRVO central retinal vein occlusion; CRAO central retinal artery occlusion; BRAO branch retinal artery occlusion; RT retinal tear; SB scleral buckle; PPV pars plana vitrectomy; VH Vitreous hemorrhage; PRP panretinal laser photocoagulation; IVK intravitreal kenalog; VMT vitreomacular traction; MH Macular hole;  NVD neovascularization of the disc; NVE neovascularization elsewhere; AREDS age related eye disease study; ARMD age related macular degeneration; POAG primary open angle glaucoma; EBMD epithelial/anterior basement membrane dystrophy; ACIOL anterior chamber intraocular lens; IOL intraocular lens; PCIOL posterior chamber intraocular lens; Phaco/IOL phacoemulsification with intraocular lens placement; [place] photorefractive keratectomy; LASIK laser assisted in situ keratomileusis; HTN hypertension; DM diabetes mellitus; COPD chronic obstructive pulmonary disease

## 2019-07-07 ENCOUNTER — Other Ambulatory Visit: Payer: Self-pay | Admitting: Family Medicine

## 2019-07-07 DIAGNOSIS — Z8673 Personal history of transient ischemic attack (TIA), and cerebral infarction without residual deficits: Secondary | ICD-10-CM

## 2019-07-07 DIAGNOSIS — I739 Peripheral vascular disease, unspecified: Secondary | ICD-10-CM

## 2019-07-07 DIAGNOSIS — I10 Essential (primary) hypertension: Secondary | ICD-10-CM

## 2019-07-07 DIAGNOSIS — I480 Paroxysmal atrial fibrillation: Secondary | ICD-10-CM

## 2019-07-07 NOTE — Telephone Encounter (Signed)
Please keep an appt with Dr. Doreene Burke in 07/2019 for more refills.

## 2019-07-18 ENCOUNTER — Ambulatory Visit: Payer: BC Managed Care – PPO | Admitting: Family Medicine

## 2019-07-21 ENCOUNTER — Encounter: Payer: Self-pay | Admitting: Gastroenterology

## 2019-07-21 DIAGNOSIS — Z0001 Encounter for general adult medical examination with abnormal findings: Secondary | ICD-10-CM | POA: Diagnosis not present

## 2019-07-21 DIAGNOSIS — Z6825 Body mass index (BMI) 25.0-25.9, adult: Secondary | ICD-10-CM | POA: Diagnosis not present

## 2019-07-21 DIAGNOSIS — R319 Hematuria, unspecified: Secondary | ICD-10-CM | POA: Diagnosis not present

## 2019-07-21 DIAGNOSIS — Z1389 Encounter for screening for other disorder: Secondary | ICD-10-CM | POA: Diagnosis not present

## 2019-07-21 DIAGNOSIS — G43B0 Ophthalmoplegic migraine, not intractable: Secondary | ICD-10-CM | POA: Diagnosis not present

## 2019-07-21 DIAGNOSIS — Z0000 Encounter for general adult medical examination without abnormal findings: Secondary | ICD-10-CM | POA: Diagnosis not present

## 2019-07-21 DIAGNOSIS — I10 Essential (primary) hypertension: Secondary | ICD-10-CM | POA: Diagnosis not present

## 2019-07-26 ENCOUNTER — Encounter: Payer: Self-pay | Admitting: Internal Medicine

## 2019-08-23 ENCOUNTER — Encounter: Payer: Self-pay | Admitting: Gastroenterology

## 2019-08-23 ENCOUNTER — Other Ambulatory Visit: Payer: Self-pay

## 2019-08-23 ENCOUNTER — Ambulatory Visit: Payer: BC Managed Care – PPO | Admitting: Gastroenterology

## 2019-08-23 DIAGNOSIS — Z1211 Encounter for screening for malignant neoplasm of colon: Secondary | ICD-10-CM

## 2019-08-23 DIAGNOSIS — R1013 Epigastric pain: Secondary | ICD-10-CM | POA: Diagnosis not present

## 2019-08-23 MED ORDER — PANTOPRAZOLE SODIUM 40 MG PO TBEC: 40.0000 mg | DELAYED_RELEASE_TABLET | Freq: Every day | ORAL | 3 refills | Status: DC

## 2019-08-23 NOTE — Progress Notes (Addendum)
Primary Care Physician:  Ginger Organ  Referring Physician: Collene Mares, PA-C Primary Gastroenterologist:  Dr. Gala Romney  Chief Complaint  Patient presents with  . Colonoscopy    alternating constipation and loose stools, feels like he has ulcer    HPI:   Frank Silva is a 54 y.o. male presenting today at the request of Collene Mares, PA-C due to rectal bleeding, alternating bowel habits, and need for colonoscopy.   No prior colonoscopy. Has had intermittent hemorrhoid issues. States he has had prolapsing hemorrhoids before. Has itching, burning, bleeding. Will have bouts of constipation then flips to more loose, present since a kid. Has had chronic LLQ discomfort for many years and has flares of it. Was worked up for biliary etiology with RUQ pain but states everything was negative. Has a gnawing feeling in LUQ. No pain. All the different pains come and go. States he has a history of hiatal hernia. Had been on omeprazole for years and then states he was having joint pain and then decided to come off of it and then ate better. Will sometimes have flares of reflux/indigestion, happening yesterday. No dysphagia. No weight loss. Will have bouts of nausea at times. Tries to avoid fried foods. If eats it will "pay for it". Sticks with broiled foods. Pain in LLQ if constipated at times or with diarrhea. Paper hematochezia. LLQ pain not "severe".   RUQ pain comes and goes.unprecipitated. HFP has remained normal. May 2021: Hgb 17.1, Hct 50.2, Creatinine 1.05, BUN 12, Tbili 0.3, alk phos 68, AST 21, ALT 26  US abdomen limited Sept 2020: probable mild hepatic steatosis, no gallstones.   He is very hesitant to stop Xarelto for procedures. He has researched multiple GI issues prior to presenting today. He has also brought up non-GI issues including post-ejaculatory pain, which I have asked he follow-up with PCP regarding this.    Past Medical History:  Diagnosis Date  . A-fib ([name])   .  Allergy   . Atrial fibrillation ([name])   . GERD (gastroesophageal reflux disease)   . Neuromuscular disorder ([place])   . Stroke [name])    2014 or 2016    Past Surgical History:  Procedure Laterality Date  . CARDIAC CATHETERIZATION     age 24    Current Outpatient Medications  Medication Sig Dispense Refill  . albuterol (VENTOLIN HFA) 108 (90 Base) MCG/ACT inhaler INHALE TWO PUFFS BY MOUTH EVERY 6 HOURS AS NEEDED FOR WHEEZING (Patient taking differently: Inhale 1-2 puffs into the lungs every 6 (six) hours as needed for wheezing or shortness of breath. ) 8 g 3  . atorvastatin (LIPITOR) 10 MG tablet Take 1 tablet by mouth once daily 90 tablet 0  . diltiazem (CARDIZEM CD) 180 MG 24 hr capsule Take 1 capsule by mouth once daily 90 capsule 0  . hydrochlorothiazide (HYDRODIURIL) 25 MG tablet Take 1 tablet by mouth once daily 90 tablet 0  . XARELTO 20 MG TABS tablet TAKE 1 TABLET BY MOUTH ONCE DAILY . APPOINTMENT REQUIRED FOR FUTURE REFILLS 60 tablet 0  . pantoprazole (PROTONIX) 40 MG tablet Take 1 tablet (40 mg total) by mouth daily. 30 minutes before breakfast daily 30 tablet 3   No current facility-administered medications for this visit.    Allergies as of 08/23/2019 - Review Complete 08/23/2019  Allergen Reaction Noted  . Flexeril [cyclobenzaprine] Other (See Comments) 08/05/2011  . Peanut-containing drug products Hives and Shortness Of Breath 04/23/2012  . Grapefruit bioflavonoid complex Other (  See Comments) 02/17/2017  . Other  02/17/2017  . Prednisone Other (See Comments) 05/26/2016    Family History  Problem Relation Age of Onset  . CVA Father   . Colon cancer Neg Hx   . Colon polyps Neg Hx     Social History   Socioeconomic History  . Marital status: Married    Spouse name: [name]  . Number of children: 3  . Years of education: 56  . Highest education level: Not on file  Occupational History  . Occupation: Truck Geophysicist/field seismologist  Tobacco Use  . Smoking status: Current Every Day  Smoker    Packs/day: 1.00    Types: Cigarettes  . Smokeless tobacco: Never Used  Vaping Use  . Vaping Use: Never used  Substance and Sexual Activity  . Alcohol use: No    Alcohol/week: 0.0 standard drinks    Comment: none in 12 years  . Drug use: No  . Sexual activity: Yes  Other Topics Concern  . Not on file  Social History Narrative   Lives at home with wife.   Caffeine use: Drink coke (5-6 glasses per day)   Social Determinants of Health   Financial Resource Strain:   . Difficulty of Paying Living Expenses:   Food Insecurity:   . Worried About Charity fundraiser in the Last Year:   . Arboriculturist in the Last Year:   Transportation Needs:   . Film/video editor (Medical):   Marland Kitchen Lack of Transportation (Non-Medical):   Physical Activity:   . Days of Exercise per Week:   . Minutes of Exercise per Session:   Stress:   . Feeling of Stress :   Social Connections:   . Frequency of Communication with Friends and Family:   . Frequency of Social Gatherings with Friends and Family:   . Attends Religious Services:   . Active Member of Clubs or Organizations:   . Attends Archivist Meetings:   Marland Kitchen Marital Status:   Intimate Partner Violence:   . Fear of Current or Ex-Partner:   . Emotionally Abused:   Marland Kitchen Physically Abused:   . Sexually Abused:     Review of Systems: Gen: Denies any fever, chills, fatigue, weight loss, lack of appetite.  CV: Denies chest pain, heart palpitations, peripheral edema, syncope.  Resp: Denies shortness of breath at rest or with exertion. Denies wheezing or cough.  GI: see HPI GU : Denies urinary burning, urinary frequency, urinary hesitancy MS: Denies joint pain, muscle weakness, cramps, or limitation of movement.  Derm: Denies rash, itching, dry skin Psych: Denies depression, anxiety, memory loss, and confusion Heme: see HPI  Physical Exam: BP (!) 144/80   Pulse 79   Temp (!) 96.9 F (36.1 C) (Temporal)   Ht '5\' 8"'  (1.727 m)    Wt 168 lb 9.6 oz (76.5 kg)   BMI 25.64 kg/m  General:   Alert and oriented. Pleasant and cooperative. Well-nourished and well-developed.  Head:  Normocephalic and atraumatic. Eyes:  Without icterus, sclera clear and conjunctiva pink.  Ears:  Normal auditory acuity. Mouth:  Mask in place Lungs:  Clear to auscultation bilaterally. No wheezes, rales, or rhonchi. No distress.  Heart:  S1, S2 present without murmurs appreciated.  Abdomen:  +BS, soft, non-tender and non-distended. No HSM noted. No guarding or rebound. No masses appreciated.  Rectal:  Deferred  Msk:  Symmetrical without gross deformities. Normal posture. Extremities:  Without edema. Neurologic:  Alert and  oriented x4;  grossly normal neurologically. Skin:  Intact without significant lesions or rashes. Psych:  Alert and cooperative. Normal mood and affect.  ASSESSMENT: Frank Silva is a 54 y.o. male presenting today at request of Collene Mares, PA-C for colonoscopy due to history of rectal bleeding, also reporting chronic abdominal pain at time of consultation.   Rectal bleeding: likely benign anorectal source, hemorrhoid etiology, unable to exclude other etiology. No prior colonoscopy. No family history of colorectal cancer or polyps. Diagnostic colonoscopy planned.  LLQ pain: seems to be moreso related to constipation/diarrhea spells. No pain today and is infrequent. Pt to call if recurs in interim and will pursue imaging.   LUQ pain: with GERD. Uncontrolled GERD without PPI.  No other alarm signs/symptoms. No prior EGD. Will pursue EGD for dyspepsia at time of colonoscopy.   He is on Xarelto chronically: he desires to keep taking this. He understands that these procedures will be diagnostic only, and any large polyps would require repeat colonoscopy with adjustment of anticoagulation.    PLAN:  Start Protonix once daily  CONTINUE XARELTO for procedures  Proceed with TCS/EGD with Dr. Gala Romney in near future: the  risks, benefits, and alternatives have been discussed with the patient in detail. The patient states understanding and desires to proceed.  Start Benefiber daily  Call if any LLQ pain: will pursue CT  Return in 6 months regardless  Annitta Needs, PhD, [facility] Gastroenterology

## 2019-08-23 NOTE — Patient Instructions (Signed)
For now, we won't adjust Xarelto until after you talk with your neurologist.   We are arranging a colonoscopy and upper endoscopy in the near future.  If you have recurrent left sided pain, please call. We do not want to pursue a colonoscopy if you have anything acute going on.  I have sent in Protonix to take once each morning, 30 minutes before breakfast.   I recommend Benefiber 2 teaspoons each morning, increasing as tolerated to help with bowel regimen.  We will see you in 6 months!  It was a pleasure to see you today. I want to create trusting relationships with patients to provide genuine, compassionate, and quality care. I value your feedback. If you receive a survey regarding your visit,  I greatly appreciate you taking time to fill this out.   Gelene Mink, PhD, ANP-BC [facility] Gastroenterology

## 2019-08-29 NOTE — Progress Notes (Signed)
Cc'ed to pcp °

## 2019-09-15 ENCOUNTER — Telehealth: Payer: Self-pay | Admitting: Gastroenterology

## 2019-09-15 ENCOUNTER — Telehealth: Payer: Self-pay

## 2019-09-15 NOTE — Telephone Encounter (Signed)
Frank Silva had made note on encounter form for procedure scheduling.

## 2019-09-15 NOTE — Telephone Encounter (Signed)
FYI:  Patient is remaining on Xarelto for procedures upcoming. I spoke with him during office visit regarding diagnostic only procedures and any large polyps would require regrouping and repeat colonoscopy with adjustment of anticoagulation.

## 2019-09-15 NOTE — Telephone Encounter (Signed)
Tried to call pt to schedule TCS/EGD w/RMR, no answer, LMOVM for return call. 

## 2019-09-20 NOTE — Telephone Encounter (Signed)
LMOVM letter mailed 

## 2019-10-10 ENCOUNTER — Telehealth: Payer: Self-pay | Admitting: Neurology

## 2019-10-10 ENCOUNTER — Encounter: Payer: Self-pay | Admitting: *Deleted

## 2019-10-10 NOTE — Telephone Encounter (Signed)
Pt has a referral to be seen in office to discuss his stroke medication that he is on.  Would it be ok to schedule patient to see you next week in the afternoon?

## 2019-10-10 NOTE — Telephone Encounter (Signed)
Error

## 2019-10-10 NOTE — Telephone Encounter (Signed)
Dr. Pearlean Brownie, Patient was referred here to follow up on Rx given to him for a stroke. He was last seen

## 2019-10-11 NOTE — Telephone Encounter (Signed)
ok 

## 2019-10-19 ENCOUNTER — Encounter: Payer: Self-pay | Admitting: *Deleted

## 2019-10-20 ENCOUNTER — Institutional Professional Consult (permissible substitution): Payer: BC Managed Care – PPO | Admitting: Neurology

## 2019-10-20 ENCOUNTER — Telehealth: Payer: Self-pay | Admitting: *Deleted

## 2019-10-20 ENCOUNTER — Encounter: Payer: Self-pay | Admitting: Neurology

## 2019-10-20 NOTE — Telephone Encounter (Signed)
Patient was no show for new patient appointment today. 

## 2019-10-27 DIAGNOSIS — Z0289 Encounter for other administrative examinations: Secondary | ICD-10-CM

## 2019-11-16 ENCOUNTER — Institutional Professional Consult (permissible substitution): Payer: BC Managed Care – PPO | Admitting: Neurology

## 2019-11-19 ENCOUNTER — Other Ambulatory Visit: Payer: Self-pay | Admitting: Family Medicine

## 2019-11-19 DIAGNOSIS — I10 Essential (primary) hypertension: Secondary | ICD-10-CM

## 2019-12-13 ENCOUNTER — Ambulatory Visit: Payer: BC Managed Care – PPO | Admitting: Neurology

## 2019-12-13 ENCOUNTER — Encounter: Payer: Self-pay | Admitting: Neurology

## 2019-12-13 VITALS — BP 150/84 | HR 76 | Ht 68.0 in | Wt 171.4 lb

## 2019-12-13 DIAGNOSIS — T466X5A Adverse effect of antihyperlipidemic and antiarteriosclerotic drugs, initial encounter: Secondary | ICD-10-CM

## 2019-12-13 DIAGNOSIS — M791 Myalgia, unspecified site: Secondary | ICD-10-CM

## 2019-12-13 DIAGNOSIS — I6990 Unspecified sequelae of unspecified cerebrovascular disease: Secondary | ICD-10-CM | POA: Diagnosis not present

## 2019-12-13 MED ORDER — COENZYME Q10 30 MG PO CAPS: 200.0000 mg | ORAL_CAPSULE | Freq: Three times a day (TID) | ORAL | 1 refills | Status: DC

## 2019-12-13 NOTE — Patient Instructions (Signed)
I had a long discussion with the patient regarding his remote cardioembolic strokes and atrial fibrillation and risk for recurrent strokes and answered questions.  He has been stroke free for nearly 5 years and has an upcoming colonoscopy.  We discussed the risk of TIA/stroke in the perioperative.  When he has to hold Xarelto for 3 days.  There is always going to be a small but acceptable risk since his last stroke was so many years ago.  Is ultimately up to the patient if he wants to take that risk.  I also recommend he start taking co-Q10 200 mg daily to help with his myalgias which may be related to Lipitor.  Check screening follow-up carotid ultrasound study.  He will continue Xarelto for stroke prevention but may hold it for 3 days prior to schedule colonoscopy with a small but acceptable periprocedural risk of TIA/stroke if he is willing.  No routine schedule appointment with me is necessary in the future and he can follow-up with his primary physician.

## 2019-12-13 NOTE — Progress Notes (Signed)
[facility] [address] [place]. [place] [address] [phone]       OFFICE CONSULT NOTE  [name] Date of Birth:  May 11, 1965 Medical Record Number:  [id number]   Referring MD:  Lenise Herald PA-c  Reason for Referral:  stroke  HPI: Frank Silva is a pleasant 54 year old Caucasian male seen today for consultation visit for history of stroke and clearance for colonoscopy.  History is obtained from the patient, review of referral notes, electronic medical records and I have personally reviewed available imaging films in PACS.  He has past medical history of chronic atrial fibrillation on long-term anticoagulation with Xarelto, hypertension, strokes in 2016 who has been recommended to have scheduled colonoscopy and is worried about the periprocedural risk of TIA/stroke after stopping Xarelto and is referred back to me for my opinion.  He states is done well in the last 5 years without recurrent TIA or stroke symptoms.  He has been tolerating Xarelto well without any bruising or bleeding.  His blood pressure has been under good control today it is slightly elevated 150/84 in office.  He remains on Lipitor but does complain of thigh aches and muscle pain.  He has not had any follow-up carotid ultrasound started now for couple of years.  He was last seen by me for more than 5 years ago and did not follow-up since he was doing so well. Last office visit 05/22/2014 ; 20 year Caucasian male seen today for the first office follow-up visit following hospital admission for stroke. He was admitted on 04/30/14 with sudden onset of right lower facial tingling and found himself biting his lips. He noticed slight speech difficulty as well and drooling of his right lips. He denied any complaints symptoms in the form of headaches, dysphagia, vision difficulties, gait, balance problems or weakness. He had no known prior history of strokes TIAs or neurological problems. He did have a history  of atrial fibrillation in the past and had been on aspirin and Cardizem since then. MRI scan of the brain showed her left corona radiata infarct and MRA should of the brain and extracranial carotid Dopplers did not reveal significant stenosis. Basic metabolic panel labs, LFTs and CBC are normal. Patient was initially reluctant to start anticoagulation but after discussion with his family he agreed to take Xarelto. Transthoracic echo was unremarkable He was also started on Cardizem for rate control of hypertension. Patient's facial droop recovered fairly quickly. He is at present at home and has no neurological deficits. He has been complaining of some minor intermittent headaches which are not bothersome. Patient works as a Hydrographic surveyor and has been told that he cannot drive for up to a year and he wants to contest this as he has no significant deficits. I agree with him since his stroke related deficits to begin with never compromises ability to drive plus he has made a full recovery from that. Is tolerating Xarelto without bleeding bruising or other side effects.  ROS:   14 system review of systems is positive for  Leg pains, heaviness, visual migraines  PMH:  Past Medical History:  Diagnosis Date  . A-fib ([facility])   . Allergy   . Atrial fibrillation ([facility])   . GERD (gastroesophageal reflux disease)   . Hypertension   . Migraines   . Neuromuscular disorder ([facility])   . Stroke [facility])    2014 or 2016    Social History:  Social History   Socioeconomic History  .  Marital status: Single    Spouse name: Frank Silva  . Number of children: 3  . Years of education: 54  . Highest education level: Not on file  Occupational History  . Occupation: Truck Hospital doctor  Tobacco Use  . Smoking status: Current Every Day Smoker    Packs/day: 1.00    Types: Cigarettes  . Smokeless tobacco: Never Used  Vaping Use  . Vaping Use: Never used  Substance and Sexual Activity  . Alcohol use: No    Alcohol/week:  0.0 standard drinks    Comment: none in 12 years  . Drug use: No  . Sexual activity: Yes  Other Topics Concern  . Not on file  Social History Narrative   Lives at home with wife.   Right Handed   Caffeine use: Drink coke (5-6 glasses per day)   Social Determinants of Health   Financial Resource Strain:   . Difficulty of Paying Living Expenses: Not on file  Food Insecurity:   . Worried About Programme researcher, broadcasting/film/video in the Last Year: Not on file  . Ran Out of Food in the Last Year: Not on file  Transportation Needs:   . Lack of Transportation (Medical): Not on file  . Lack of Transportation (Non-Medical): Not on file  Physical Activity:   . Days of Exercise per Week: Not on file  . Minutes of Exercise per Session: Not on file  Stress:   . Feeling of Stress : Not on file  Social Connections:   . Frequency of Communication with Friends and Family: Not on file  . Frequency of Social Gatherings with Friends and Family: Not on file  . Attends Religious Services: Not on file  . Active Member of Clubs or Organizations: Not on file  . Attends Banker Meetings: Not on file  . Marital Status: Not on file  Intimate Partner Violence:   . Fear of Current or Ex-Partner: Not on file  . Emotionally Abused: Not on file  . Physically Abused: Not on file  . Sexually Abused: Not on file    Medications:   Current Outpatient Medications on File Prior to Visit  Medication Sig Dispense Refill  . acetaminophen (TYLENOL) 500 MG tablet Take 500 mg by mouth every 6 (six) hours as needed.    Marland Kitchen albuterol (VENTOLIN HFA) 108 (90 Base) MCG/ACT inhaler INHALE TWO PUFFS BY MOUTH EVERY 6 HOURS AS NEEDED FOR WHEEZING (Patient taking differently: Inhale 1-2 puffs into the lungs every 6 (six) hours as needed for wheezing or shortness of breath. ) 8 g 3  . atorvastatin (LIPITOR) 10 MG tablet Take 1 tablet by mouth once daily 90 tablet 0  . diltiazem (CARDIZEM CD) 180 MG 24 hr capsule Take 1 capsule by  mouth once daily 90 capsule 0  . hydrochlorothiazide (HYDRODIURIL) 25 MG tablet Take 1 tablet by mouth once daily 90 tablet 0  . XARELTO 20 MG TABS tablet TAKE 1 TABLET BY MOUTH ONCE DAILY . APPOINTMENT REQUIRED FOR FUTURE REFILLS 60 tablet 0  . pantoprazole (PROTONIX) 40 MG tablet Take 1 tablet (40 mg total) by mouth daily. 30 minutes before breakfast daily 30 tablet 3   No current facility-administered medications on file prior to visit.    Allergies:   Allergies  Allergen Reactions  . Flexeril [Cyclobenzaprine] Other (See Comments)    Heart race  . Peanut-Containing Drug Products Hives and Shortness Of Breath  . Benadryl [Diphenhydramine]     jittery  . Grapefruit Bioflavonoid  Complex Other (See Comments)    Interaction with medication  . Other     Paprika  . Prednisone Other (See Comments)    Tremors    Physical Exam General: well developed, well nourished middle-aged Caucasian male, seated, in no evident distress Head: head normocephalic and atraumatic.   Neck: supple with no carotid or supraclavicular bruits Cardiovascular: regular rate and rhythm, no murmurs Musculoskeletal: no deformity Skin:  no rash/petichiae Vascular:  Normal pulses all extremities  Neurologic Exam Mental Status: Awake and fully alert. Oriented to place and time. Recent and remote memory intact. Attention span, concentration and fund of knowledge appropriate. Mood and affect appropriate.  Cranial Nerves: Fundoscopic exam reveals sharp disc margins. Pupils equal, briskly reactive to light. Extraocular movements full without nystagmus. Visual fields full to confrontation. Hearing intact. Facial sensation intact. Face, tongue, palate moves normally and symmetrically.  Motor: Normal bulk and tone. Normal strength in all tested extremity muscles. Sensory.: intact to touch , pinprick , position and vibratory sensation.  Coordination: Rapid alternating movements normal in all extremities. Finger-to-nose and  heel-to-shin performed accurately bilaterally. Gait and Station: Arises from chair without difficulty. Stance is normal. Gait demonstrates normal stride length and balance . Able to heel, toe and tandem walk without difficulty.  Reflexes: 1+ and symmetric. Toes downgoing.   NIHSS  0 yes Modified Rankin  0   ASSESSMENT: 54 year old Caucasian male with prior history of embolic left coronary to infarct in February 2016 secondary to paroxysmal atrial fibrillation from which he made full recovery and is doing well on long-term Xarelto.Marland Kitchen  He has scheduled colonoscopy and is worried about periprocedural risk of TIA/stroke while holding Xarelto for the procedure.     PLAN: I had a long discussion with the patient regarding his remote cardioembolic strokes and atrial fibrillation and risk for recurrent strokes and answered questions.  He has been stroke free for nearly 5 years and has an upcoming colonoscopy.  We discussed the risk of TIA/stroke in the perioperative.  When he has to hold Xarelto for 3 days.  There is always going to be a small but acceptable risk since his last stroke was so many years ago.  It is ultimately up to the patient if he wants to take that risk.  I also recommend he start taking co-Q10 200 mg daily to help with his myalgias which may be related to Lipitor.  Check screening follow-up carotid ultrasound study.  He will continue Xarelto for stroke prevention but may hold it for 3 days prior to schedule colonoscopy with a small but acceptable periprocedural risk of TIA/stroke if he is willing.  Greater than 50% time during this 45-minute consultation was it was spent on counseling and coordination of care about his remote history history of strokes, atrial fibrillation and periprocedural risk of TIA/stroke while holding Xarelto for elective colonoscopy and answering questions no routine schedule appointment with me is necessary in the future and he can follow-up with his primary  physician. Delia Heady, MD Note: This document was prepared with digital dictation and possible smart phrase technology. Any transcriptional errors that result from this process are unintentional.

## 2019-12-28 ENCOUNTER — Encounter (HOSPITAL_COMMUNITY): Payer: Self-pay | Admitting: Emergency Medicine

## 2019-12-28 ENCOUNTER — Emergency Department (HOSPITAL_COMMUNITY): Payer: BC Managed Care – PPO

## 2019-12-28 ENCOUNTER — Emergency Department (HOSPITAL_COMMUNITY)
Admission: EM | Admit: 2019-12-28 | Discharge: 2019-12-28 | Disposition: A | Discharge status: Home / Self Care | Payer: BC Managed Care – PPO | Attending: Emergency Medicine | Admitting: Emergency Medicine

## 2019-12-28 ENCOUNTER — Other Ambulatory Visit: Payer: Self-pay

## 2019-12-28 DIAGNOSIS — I4891 Unspecified atrial fibrillation: Secondary | ICD-10-CM | POA: Diagnosis not present

## 2019-12-28 DIAGNOSIS — Z7901 Long term (current) use of anticoagulants: Secondary | ICD-10-CM | POA: Insufficient documentation

## 2019-12-28 DIAGNOSIS — R079 Chest pain, unspecified: Secondary | ICD-10-CM

## 2019-12-28 DIAGNOSIS — Z79899 Other long term (current) drug therapy: Secondary | ICD-10-CM | POA: Insufficient documentation

## 2019-12-28 DIAGNOSIS — E876 Hypokalemia: Secondary | ICD-10-CM | POA: Diagnosis not present

## 2019-12-28 DIAGNOSIS — Z91010 Allergy to peanuts: Secondary | ICD-10-CM | POA: Insufficient documentation

## 2019-12-28 DIAGNOSIS — F17210 Nicotine dependence, cigarettes, uncomplicated: Secondary | ICD-10-CM | POA: Diagnosis not present

## 2019-12-28 DIAGNOSIS — I10 Essential (primary) hypertension: Secondary | ICD-10-CM | POA: Insufficient documentation

## 2019-12-28 DIAGNOSIS — J45909 Unspecified asthma, uncomplicated: Secondary | ICD-10-CM | POA: Insufficient documentation

## 2019-12-28 LAB — BASIC METABOLIC PANEL
Anion gap: 8 (ref 5–15)
BUN: 15 mg/dL (ref 6–20)
CO2: 26 mmol/L (ref 22–32)
Calcium: 9 mg/dL (ref 8.9–10.3)
Chloride: 100 mmol/L (ref 98–111)
Creatinine, Ser: 1.05 mg/dL (ref 0.61–1.24)
GFR, Estimated: >60 mL/min (ref >60)
Glucose, Bld: 107 mg/dL — ABNORMAL HIGH (ref 70–99)
Potassium: 3.1 mmol/L — ABNORMAL LOW (ref 3.5–5.1)
Sodium: 134 mmol/L — ABNORMAL LOW (ref 135–145)

## 2019-12-28 LAB — CBC WITH DIFFERENTIAL/PLATELET
Abs Immature Granulocytes: 0.06 10*3/uL (ref 0.00–0.07)
Basophils Absolute: 0.1 10*3/uL (ref 0.0–0.1)
Basophils Relative: 1 %
Eosinophils Absolute: 0.2 10*3/uL (ref 0.0–0.5)
Eosinophils Relative: 2 %
HCT: 47.6 % (ref 39.0–52.0)
Hemoglobin: 16.7 g/dL (ref 13.0–17.0)
Immature Granulocytes: 1 %
Lymphocytes Relative: 23 %
Lymphs Abs: 2.1 10*3/uL (ref 0.7–4.0)
MCH: 32.8 pg (ref 26.0–34.0)
MCHC: 35.1 g/dL (ref 30.0–36.0)
MCV: 93.5 fL (ref 80.0–100.0)
Monocytes Absolute: 0.8 10*3/uL (ref 0.1–1.0)
Monocytes Relative: 8 %
Neutro Abs: 6 10*3/uL (ref 1.7–7.7)
Neutrophils Relative %: 65 %
Platelets: 222 10*3/uL (ref 150–400)
RBC: 5.09 MIL/uL (ref 4.22–5.81)
RDW: 12.6 % (ref 11.5–15.5)
WBC: 9.3 10*3/uL (ref 4.0–10.5)
nRBC: 0 % (ref 0.0–0.2)

## 2019-12-28 LAB — TROPONIN I (HIGH SENSITIVITY)
Troponin I (High Sensitivity): 3 ng/L (ref <18)
Troponin I (High Sensitivity): 3 ng/L (ref <18)

## 2019-12-28 MED ORDER — POTASSIUM CHLORIDE ER 10 MEQ PO TBCR: 10.0000 meq | EXTENDED_RELEASE_TABLET | Freq: Every day | ORAL | 0 refills | Status: DC

## 2019-12-28 MED ORDER — POTASSIUM CHLORIDE CRYS ER 20 MEQ PO TBCR
40.0000 meq | EXTENDED_RELEASE_TABLET | Freq: Once | ORAL | Status: AC
  Administered 2019-12-28: 40 meq via ORAL
  Filled 2019-12-28: qty 2

## 2019-12-28 NOTE — ED Provider Notes (Signed)
[facility] EMERGENCY DEPARTMENT Provider Note   CSN: [id number] Arrival date & time: 12/28/19  1021     History Chief Complaint  Patient presents with  . Atrial Fibrillation    Frank Silva is a 54 y.o. male with PMHx A fib, HTN, CVA on Xarelto, GERD who presents to the ED today with CC of A fib. Pt reports that last night when he woke up prior to going into his night shift around 4:30 PM he reports feeling "bad." Pt reports generalized weakness/fatigue. He states shortly afterwards he would feel his heart skipping beats and was concerned he was in A fib. He states his blood pressure monitor confirmed this and it has been going on throughout the night and earlier this morning. Pt also complains of intermittent chest tightness with the associated skipped beats. He states he was unable to sleep much last night due to this feeling. He went to his PCP earlier this morning however was told to come to the ED to be "checked out." Pt reports he used to see a cardiologist however has not seen one in several years. He denies missing any doses of his medications. He does mention he recently saw his neurologist Dr. Pearlean Brownie and was started on Co-enzyme Q10 4 days ago. He is unsure if this is causing his A fib to recur or if it is the weather changes. Pt denies any fevers, chills, worsening cough (has cough at baseline due to smoking), shortness of breath, nausea, vomiting, diaphoresis, or any other associated symptoms.   The history is provided by the patient and medical records.       Past Medical History:  Diagnosis Date  . A-fib (HCC)   . Allergy   . Atrial fibrillation (HCC)   . GERD (gastroesophageal reflux disease)   . Hypertension   . Migraines   . Neuromuscular disorder (HCC)   . Stroke [facility])    2014 or 2016    Patient Active Problem List   Diagnosis Date Noted  . Dyspepsia 08/23/2019  . Encounter for screening colonoscopy 08/23/2019  . Chronic fatigue 11/08/2018  . Reactive  airway disease 11/08/2018  . Right upper quadrant pain 11/08/2018  . Tick bite 11/08/2018  . Cellulitis of right upper extremity 08/03/2018  . Plantar wart 08/03/2018  . Erectile dysfunction due to arterial insufficiency 06/03/2018  . Essential hypertension 02/03/2018  . Hematuria 02/03/2018  . Screen for colon cancer 01/19/2018  . Pain of right hip joint 01/19/2018  . Oral lesion 01/19/2018  . Acute maxillary sinusitis 01/19/2018  . Tobacco abuse 01/19/2018  . PVD (peripheral vascular disease) (HCC) 01/19/2018  . Heme positive stool 01/19/2018  . Epigastric pain 01/19/2018  . History of CVA (cerebrovascular accident) 01/19/2018  . Asymptomatic microscopic hematuria 01/19/2018  . PAF (paroxysmal atrial fibrillation) (HCC) 05/22/2014  . 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 Stroke (HCC)   . History of Dysrhythmia, cardiac 04/30/2014    Past Surgical History:  Procedure Laterality Date  . CARDIAC CATHETERIZATION     age 65       Family History  Problem Relation Age of Onset  . CVA Father   . Colon cancer Neg Hx   . Colon polyps Neg Hx     Social History   Tobacco Use  . Smoking status: Current Every Day Smoker    Packs/day: 1.00    Types: Cigarettes  . Smokeless tobacco: Never Used  Vaping Use  . Vaping Use: Never used  Substance Use  Topics  . Alcohol use: No    Alcohol/week: 0.0 standard drinks    Comment: none in 12 years  . Drug use: No    Home Medications Prior to Admission medications   Medication Sig Start Date End Date Taking? Authorizing Provider  albuterol (VENTOLIN HFA) 108 (90 Base) MCG/ACT inhaler INHALE TWO PUFFS BY MOUTH EVERY 6 HOURS AS NEEDED FOR WHEEZING Patient taking differently: Inhale 1-2 puffs into the lungs every 6 (six) hours as needed for wheezing or shortness of breath.  11/12/18  Yes Mliss SaxKremer, William Alfred, MD  atorvastatin (LIPITOR) 10 MG tablet Take 1 tablet by mouth once daily 07/07/19  Yes Mliss SaxKremer, William Alfred, MD    Cholecalciferol (D3-1000) 25 MCG (1000 UT) tablet Take 1,000 Units by mouth daily.   Yes [provider]  co-enzyme Q-10 30 MG capsule Take 7 capsules (210 mg total) by mouth 3 (three) times daily. Patient taking differently: Take 100 mg by mouth daily.  12/13/19  Yes Micki RileySethi, Pramod S, MD  diltiazem (CARDIZEM CD) 180 MG 24 hr capsule Take 1 capsule by mouth once daily 07/07/19  Yes Mliss SaxKremer, William Alfred, MD  [facility] PO Take 1 tablet by mouth in the morning.   Yes [provider]  fluticasone (FLONASE) 50 MCG/ACT nasal spray Place 1 spray into both nostrils daily.   Yes [provider]  hydrochlorothiazide (HYDRODIURIL) 25 MG tablet Take 1 tablet by mouth once daily 11/19/19  Yes Mliss SaxKremer, William Alfred, MD  vitamin B-12 (CYANOCOBALAMIN) 500 MCG tablet Take 500 mcg by mouth daily.   Yes [provider]  XARELTO 20 MG TABS tablet TAKE 1 TABLET BY MOUTH ONCE DAILY . APPOINTMENT REQUIRED FOR FUTURE REFILLS Patient taking differently: Take 20 mg by mouth in the morning.  06/07/19  Yes Mliss SaxKremer, William Alfred, MD  acetaminophen (TYLENOL) 500 MG tablet Take 500 mg by mouth every 6 (six) hours as needed. Patient not taking: Reported on 12/28/2019    [provider]  pantoprazole (PROTONIX) 40 MG tablet Take 1 tablet (40 mg total) by mouth daily. 30 minutes before breakfast daily Patient not taking: Reported on 12/28/2019 08/23/19   Gelene MinkBoone, Anna W, NP  potassium chloride (KLOR-CON) 10 MEQ tablet Take 1 tablet (10 mEq total) by mouth daily for 4 days. 12/28/19 01/01/20  Tanda RockersVenter, Kailey Esquilin, PA-C    Allergies    Flexeril [cyclobenzaprine], Peanut-containing drug products, Benadryl [diphenhydramine], Grapefruit bioflavonoid complex, Other, and Prednisone  Review of Systems   Review of Systems  Constitutional: Positive for fatigue. Negative for chills and fever.  Neurological: Positive for weakness (generalized).    Physical Exam Updated Vital  Signs BP 132/74 (BP Location: Right Arm)   Pulse 76   Temp 97.8 F (36.6 C) (Oral)   Resp 18   Ht 5\' 8"  (1.727 m)   Wt 78 kg   SpO2 98%   BMI 26.15 kg/m   Physical Exam Vitals and nursing note reviewed.  Constitutional:      Appearance: He is not ill-appearing or diaphoretic.  HENT:     Head: Normocephalic and atraumatic.  Eyes:     Conjunctiva/sclera: Conjunctivae normal.  Cardiovascular:     Rate and Rhythm: Normal rate and regular rhythm.     Pulses: Normal pulses.  Pulmonary:     Effort: Pulmonary effort is normal.     Breath sounds: Normal breath sounds. No wheezing, rhonchi or rales.  Abdominal:     Palpations: Abdomen is soft.     Tenderness: There is  no abdominal tenderness. There is no guarding or rebound.  Musculoskeletal:     Cervical back: Neck supple.  Skin:    General: Skin is warm and dry.  Neurological:     Mental Status: He is alert.     ED Results / Procedures / Treatments   Labs (all labs ordered are listed, but only abnormal results are displayed) Labs Reviewed  BASIC METABOLIC PANEL - Abnormal; Notable for the following components:      Result Value   Sodium 134 (*)    Potassium 3.1 (*)    Glucose, Bld 107 (*)    All other components within normal limits  CBC WITH DIFFERENTIAL/PLATELET  TROPONIN I (HIGH SENSITIVITY)  TROPONIN I (HIGH SENSITIVITY)    EKG EKG Interpretation  Date/Time:  Wednesday December 28 2019 11:10:02 EDT Ventricular Rate:  78 PR Interval:  182 QRS Duration: 104 QT Interval:  388 QTC Calculation: 442 R Axis:   -83 Text Interpretation: Sinus rhythm with Premature atrial complexes Left axis deviation Pulmonary disease pattern Abnormal ECG new PACs since prior 9/20 Confirmed by Meridee Score [phone]) on 12/28/2019 11:24:12 AM   Radiology DG Chest Port 1 View  Result Date: 12/28/2019 CLINICAL DATA:  Chest pain.  AFib. EXAM: PORTABLE CHEST 1 VIEW COMPARISON:  11/19/2018 FINDINGS: The heart size and mediastinal  contours are within normal limits. Both lungs are clear. No visible pleural effusions or pneumothorax. No acute osseous abnormality. IMPRESSION: No acute cardiopulmonary disease. Electronically Signed   By: Feliberto Harts MD   On: 12/28/2019 12:47    Procedures Procedures (including critical care time)  Medications Ordered in ED Medications  potassium chloride SA (KLOR-CON) CR tablet 40 mEq (40 mEq Oral Given 12/28/19 1538)    ED Course  I have reviewed the triage vital signs and the nursing notes.  Pertinent labs & imaging results that were available during my care of the patient were reviewed by me and considered in my medical decision making (see chart for details).    MDM Rules/Calculators/A&P                          54 year old male with a history of atrial fibrillation who presents to the ED today with complaints of chest tightness and skipping beats that began last night with generalized fatigue.  He went to his PCP earlier today and was told to come to the ED for further evaluation.  On arrival to the ED patient is afebrile, nontachycardic nontachypneic.  He appears to be no acute distress.  An EKG was obtained while patient was in the waiting room with sinus rhythm with PACs.  Does not appear to be in A. fib at this time.  States he is having some chest tightness however no history of MI.  Story does not seem consistent with ACS however will work-up for this at this time including chest x-ray, troponin, labs.  He is on Xarelto and has not missed any doses.  Low suspicion for PE.  We will continue to monitor in the ED. If no acute findings suspect discharge home.   CBC without leukocytosis. Hgb stable at 16.7.  BMP with sodium 134 and potassium 3.1. Will replete in the ED.  Initial troponin of 3. Will repeat.   Repeat troponin unchanged at 3.   Work up reassuring at this time. Pt to be discharged home with [facility] for the next 4 days and PCP follow up next week to recheck  potassium level. Pt would like to see a cardiologist regarding his A fib - will place an ambulatory referral at this time. He is instructed on strict return precautions. Pt is in agreement with plan and stable for discharge.   This note was prepared using Dragon voice recognition software and may include unintentional dictation errors due to the inherent limitations of voice recognition software.  Final Clinical Impression(s) / ED Diagnoses Final diagnoses:  Atrial fibrillation, unspecified type (HCC)  Hypokalemia  Chest pain, unspecified type    Rx / DC Orders ED Discharge Orders         Ordered    Ambulatory referral to Cardiology        12/28/19 1555    potassium chloride (KLOR-CON) 10 MEQ tablet  Daily        12/28/19 1557           Discharge Instructions     Please take the oral potassium as prescribed for the next 4 days. Follow up with your PCP next week for recheck of your potassium level.  I have placed an ambulatory referral to the cardiologist. They will call to schedule an appointment. If you have not heard from them by Friday please call their office to schedule an appointment to establish care.  Return to the ED IMMEDIATELY for any worsening symptoms including worsening chest pain, shortness of breath, excessive sweating, passing out, vomiting, or any other new/concerning symptoms       Tanda Rockers, PA-C 12/28/19 1601    Terrilee Files, MD 12/28/19 [phone]

## 2019-12-28 NOTE — Discharge Instructions (Signed)
Please take the oral potassium as prescribed for the next 4 days. Follow up with your PCP next week for recheck of your potassium level.  I have placed an ambulatory referral to the cardiologist. They will call to schedule an appointment. If you have not heard from them by Friday please call their office to schedule an appointment to establish care.  Return to the ED IMMEDIATELY for any worsening symptoms including worsening chest pain, shortness of breath, excessive sweating, passing out, vomiting, or any other new/concerning symptoms

## 2019-12-28 NOTE — ED Triage Notes (Signed)
History of Afib, pt says he is here to be check out.  Denies chest pain but has history of bronchitis and want to be checked out.

## 2020-01-06 DIAGNOSIS — E876 Hypokalemia: Secondary | ICD-10-CM | POA: Diagnosis not present

## 2020-01-09 ENCOUNTER — Ambulatory Visit (HOSPITAL_COMMUNITY)
Admission: RE | Admit: 2020-01-09 | Discharge: 2020-01-09 | Disposition: A | Discharge status: Home / Self Care | Payer: BC Managed Care – PPO | Source: Ambulatory Visit | Attending: Neurology | Admitting: Neurology

## 2020-01-09 ENCOUNTER — Other Ambulatory Visit: Payer: Self-pay

## 2020-01-09 DIAGNOSIS — I6990 Unspecified sequelae of unspecified cerebrovascular disease: Secondary | ICD-10-CM | POA: Diagnosis not present

## 2020-01-09 NOTE — Progress Notes (Signed)
Referring-Margaux Hyman Hopes, PA-C Reason for referral-atrial fibrillation  HPI: 54 year old male for evaluation of atrial fibrillation at request of Tanda Rockers, PA-C. Patient has a history of paroxysmal atrial fibrillation, CVA and is on chronic Xarelto. Cath 2005 showed no CAD  Echocardiogram February 2016 showed normal LV function, grade 1 diastolic dysfunction. ABIs with Doppler November 2019 showed no significant arterial occlusive disease. Carotid Dopplers 11/21 showed 1 to 39% bilateral stenosis. Patient was seen in the emergency room October 2021 with palpitations.  He was concerned he may be in atrial fibrillation. He was found to be in sinus rhythm and was asked to follow-up with cardiology. Potassium was 3.1, creatinine 1.05, hemoglobin 16.7, and troponins normal x2.  Patient denies dyspnea, chest pain, syncope or bleeding.  He does have occasional palpitations described as a skip.  This was the reason for his recent ER visit.  This improved with potassium supplementation.  Current Outpatient Medications  Medication Sig Dispense Refill  . acetaminophen (TYLENOL) 500 MG tablet Take 500 mg by mouth every 6 (six) hours as needed. (Patient not taking: Reported on 12/28/2019)    . albuterol (VENTOLIN HFA) 108 (90 Base) MCG/ACT inhaler INHALE TWO PUFFS BY MOUTH EVERY 6 HOURS AS NEEDED FOR WHEEZING (Patient taking differently: Inhale 1-2 puffs into the lungs every 6 (six) hours as needed for wheezing or shortness of breath. ) 8 g 3  . atorvastatin (LIPITOR) 10 MG tablet Take 1 tablet by mouth once daily 90 tablet 0  . Cholecalciferol (D3-1000) 25 MCG (1000 UT) tablet Take 1,000 Units by mouth daily.    Marland Kitchen co-enzyme Q-10 30 MG capsule Take 7 capsules (210 mg total) by mouth 3 (three) times daily. (Patient taking differently: Take 100 mg by mouth daily. ) 60 capsule 1  . diltiazem (CARDIZEM CD) 180 MG 24 hr capsule Take 1 capsule by mouth once daily 90 capsule 0  . ECHINACEA-ZINC-VITAMIN C PO  Take 1 tablet by mouth in the morning.    . fluticasone (FLONASE) 50 MCG/ACT nasal spray Place 1 spray into both nostrils daily.    . hydrochlorothiazide (HYDRODIURIL) 25 MG tablet Take 1 tablet by mouth once daily 90 tablet 0  . pantoprazole (PROTONIX) 40 MG tablet Take 1 tablet (40 mg total) by mouth daily. 30 minutes before breakfast daily (Patient not taking: Reported on 12/28/2019) 30 tablet 3  . potassium chloride (KLOR-CON) 10 MEQ tablet Take 1 tablet (10 mEq total) by mouth daily for 4 days. 4 tablet 0  . vitamin B-12 (CYANOCOBALAMIN) 500 MCG tablet Take 500 mcg by mouth daily.    Carlena Hurl 20 MG TABS tablet TAKE 1 TABLET BY MOUTH ONCE DAILY . APPOINTMENT REQUIRED FOR FUTURE REFILLS (Patient taking differently: Take 20 mg by mouth in the morning. ) 60 tablet 0   No current facility-administered medications for this visit.    Allergies  Allergen Reactions  . Flexeril [Cyclobenzaprine] Other (See Comments)    Heart race  . Peanut-Containing Drug Products Hives and Shortness Of Breath  . Benadryl [Diphenhydramine]     jittery  . Grapefruit Bioflavonoid Complex Other (See Comments)    Interaction with medication  . Other Other (See Comments)    Paprika, med interaction can throw heat out of rhythm   . Prednisone Other (See Comments)    Tremors     Past Medical History:  Diagnosis Date  . Allergy   . Atrial fibrillation (HCC)   . GERD (gastroesophageal reflux disease)   . Hyperlipidemia   .  Hypertension   . IBS (irritable bowel syndrome)   . Migraines   . Neuromuscular disorder (HCC)   . Stroke [facility])    2014 or 2016    Past Surgical History:  Procedure Laterality Date  . CARDIAC CATHETERIZATION     age 53    Social History   Socioeconomic History  . Marital status: Divorced    Spouse name: [name]  . Number of children: 3  . Years of education: 46  . Highest education level: Not on file  Occupational History  . Occupation: [employer] doctor  Tobacco Use  . Smoking  status: Current Every Day Smoker    Packs/day: 1.00    Types: Cigarettes  . Smokeless tobacco: Never Used  Vaping Use  . Vaping Use: Never used  Substance and Sexual Activity  . Alcohol use: No    Alcohol/week: 0.0 standard drinks    Comment: none in 12 years  . Drug use: No  . Sexual activity: Yes  Other Topics Concern  . Not on file  Social History Narrative   Lives at home with wife.   Right Handed   Caffeine use: Drink coke (5-6 glasses per day)   Social Determinants of Health   Financial Resource Strain:   . Difficulty of Paying Living Expenses: Not on file  Food Insecurity:   . Worried About Programme researcher, broadcasting/film/video in the Last Year: Not on file  . Ran Out of Food in the Last Year: Not on file  Transportation Needs:   . Lack of Transportation (Medical): Not on file  . Lack of Transportation (Non-Medical): Not on file  Physical Activity:   . Days of Exercise per Week: Not on file  . Minutes of Exercise per Session: Not on file  Stress:   . Feeling of Stress : Not on file  Social Connections:   . Frequency of Communication with Friends and Family: Not on file  . Frequency of Social Gatherings with Friends and Family: Not on file  . Attends Religious Services: Not on file  . Active Member of Clubs or Organizations: Not on file  . Attends Banker Meetings: Not on file  . Marital Status: Not on file  Intimate Partner Violence:   . Fear of Current or Ex-Partner: Not on file  . Emotionally Abused: Not on file  . Physically Abused: Not on file  . Sexually Abused: Not on file    Family History  Problem Relation Age of Onset  . CVA Father   . Colon cancer Neg Hx   . Colon polyps Neg Hx     ROS: Some back pain but no fevers or chills, productive cough, hemoptysis, dysphasia, odynophagia, melena, hematochezia, dysuria, hematuria, rash, seizure activity, orthopnea, PND, pedal edema, claudication. Remaining systems are negative.  Physical Exam:   Blood  pressure 130/78, pulse 69, temperature (!) 97.3 F (36.3 C), height 5\' 8"  (1.727 m), weight 176 lb 3.2 oz (79.9 kg), SpO2 98 %.  General:  Well developed/well nourished in NAD Skin warm/dry Patient not depressed No peripheral clubbing Back-normal HEENT-normal/normal eyelids Neck supple/normal carotid upstroke bilaterally; no bruits; no JVD; no thyromegaly chest - CTA/ normal expansion CV - RRR/normal S1 and S2; no murmurs, rubs or gallops;  PMI nondisplaced Abdomen -NT/ND, no HSM, no mass, + bowel sounds, no bruit 2+ femoral pulses, no bruits Ext-no edema, chords, 2+ DP Neuro-grossly nonfocal  ECG -normal sinus rhythm at a rate of 69, left axis deviation. Personally reviewed  A/P  1 paroxysmal atrial fibrillation-patient remains in sinus rhythm.  We will continue Cardizem for rate control if atrial fibrillation recurs.  Continue Xarelto (he will need lifelong anticoagulation given history of CVA).  Repeat echocardiogram.  We can consider an antiarrhythmic in the future if he has more frequent episodes.  2 hypertension-patient's blood pressure is controlled.  He feels as though HCTZ may be causing some side effects.  We discussed changing to an ARB.  He will consider and we will do this in the future if necessary.  Note his recent potassium was low in the emergency room but improved to 3.7 on laboratories January 06, 2020.  3 tobacco abuse-we discussed the importance of discontinuing.  We will also arrange a calcium score.  4 hyperlipidemia-continue statin.  Olga Millers, MD

## 2020-01-09 NOTE — Progress Notes (Signed)
VASCULAR LAB    Carotid duplex has been performed.  See CV proc for preliminary results.   Jhordan Kinter, RVT 01/09/2020, 9:27 AM

## 2020-01-10 NOTE — Progress Notes (Signed)
Kindly inform the patient that carotid ultrasound study shows no significant blockage at either carotid bifurcation in the neck to worry about

## 2020-01-16 ENCOUNTER — Encounter: Payer: Self-pay | Admitting: Cardiology

## 2020-01-16 ENCOUNTER — Ambulatory Visit (INDEPENDENT_AMBULATORY_CARE_PROVIDER_SITE_OTHER): Payer: BC Managed Care – PPO | Admitting: Cardiology

## 2020-01-16 ENCOUNTER — Other Ambulatory Visit: Payer: Self-pay

## 2020-01-16 VITALS — BP 130/78 | HR 69 | Temp 97.3°F | Ht 68.0 in | Wt 176.2 lb

## 2020-01-16 DIAGNOSIS — I10 Essential (primary) hypertension: Secondary | ICD-10-CM

## 2020-01-16 DIAGNOSIS — I480 Paroxysmal atrial fibrillation: Secondary | ICD-10-CM

## 2020-01-16 DIAGNOSIS — Z136 Encounter for screening for cardiovascular disorders: Secondary | ICD-10-CM | POA: Diagnosis not present

## 2020-01-16 NOTE — Patient Instructions (Signed)
  Testing/Procedures:  Your physician has requested that you have an echocardiogram. Echocardiography is a painless test that uses sound waves to create images of your heart. It provides your doctor with information about the size and shape of your heart and how well your heart's chambers and valves are working. This procedure takes approximately one hour. There are no restrictions for this procedure.[address]  CT CARDIAC SCORING     Follow-Up: At [facility], you and your health needs are our priority.  As part of our continuing mission to provide you with exceptional heart care, we have created designated Provider Care Teams.  These Care Teams include your primary Cardiologist (physician) and Advanced Practice Providers (APPs -  Physician Assistants and Nurse Practitioners) who all work together to provide you with the care you need, when you need it.  We recommend signing up for the patient portal called "MyChart".  Sign up information is provided on this After Visit Summary.  MyChart is used to connect with patients for Virtual Visits (Telemedicine).  Patients are able to view lab/test results, encounter notes, upcoming appointments, etc.  Non-urgent messages can be sent to your provider as well.   To learn more about what you can do with MyChart, go to [email or web address].    Your next appointment:   6 month(s)  The format for your next appointment:   In Person  Provider:   Olga Millers, MD

## 2020-01-17 ENCOUNTER — Telehealth: Payer: Self-pay | Admitting: Emergency Medicine

## 2020-01-17 NOTE — Telephone Encounter (Signed)
Called and LVM for patient with Dr. Marlis Edelson findings of carotid ultrasound.  Left call back number if patient had questions.

## 2020-01-17 NOTE — Telephone Encounter (Signed)
-----   Message from Micki Riley, MD sent at 01/10/2020  8:40 AM EDT ----- Joneen Roach inform the patient that carotid ultrasound study shows no significant blockage at either carotid bifurcation in the neck to worry about

## 2020-01-18 ENCOUNTER — Telehealth: Payer: Self-pay | Admitting: Cardiology

## 2020-01-18 DIAGNOSIS — I10 Essential (primary) hypertension: Secondary | ICD-10-CM

## 2020-01-18 MED ORDER — HYDROCHLOROTHIAZIDE 25 MG PO TABS: 25.0000 mg | ORAL_TABLET | Freq: Every day | ORAL | 3 refills | Status: DC

## 2020-01-18 NOTE — Telephone Encounter (Signed)
   Pt said would like to speak with Dr. Ludwig Clarks nurse about his BP medications

## 2020-01-18 NOTE — Telephone Encounter (Signed)
Spoke with patient. Refilled HCTZ per request.

## 2020-01-19 NOTE — Progress Notes (Signed)
Kindly inform the patient that carotid ultrasound study showed no significant blockages of either carotid arteries in the neck

## 2020-01-24 ENCOUNTER — Telehealth: Payer: Self-pay | Admitting: Emergency Medicine

## 2020-01-24 NOTE — Telephone Encounter (Signed)
-----   Message from Micki Riley, MD sent at 01/19/2020  5:43 PM EST ----- Kindly inform the patient that carotid ultrasound study showed no significant blockages of either carotid arteries in the neck.

## 2020-01-24 NOTE — Telephone Encounter (Signed)
LVM with Dr. Marlis Edelson findings of carotid ultrasound study and office number to return call if he had any questions.

## 2020-02-13 ENCOUNTER — Ambulatory Visit
Admission: RE | Admit: 2020-02-13 | Discharge: 2020-02-13 | Disposition: A | Discharge status: Home / Self Care | Payer: Self-pay | Source: Ambulatory Visit | Attending: Cardiology | Admitting: Cardiology

## 2020-02-13 ENCOUNTER — Other Ambulatory Visit: Payer: Self-pay

## 2020-02-13 ENCOUNTER — Ambulatory Visit (HOSPITAL_COMMUNITY): Payer: BC Managed Care – PPO | Attending: Cardiology

## 2020-02-13 DIAGNOSIS — Z136 Encounter for screening for cardiovascular disorders: Secondary | ICD-10-CM

## 2020-02-13 DIAGNOSIS — I480 Paroxysmal atrial fibrillation: Secondary | ICD-10-CM | POA: Diagnosis not present

## 2020-02-13 LAB — ECHOCARDIOGRAM COMPLETE
Area-P 1/2: 3.1 cm2
S' Lateral: 2.5 cm

## 2020-02-21 ENCOUNTER — Ambulatory Visit: Payer: BC Managed Care – PPO | Admitting: Gastroenterology

## 2020-02-27 DIAGNOSIS — J329 Chronic sinusitis, unspecified: Secondary | ICD-10-CM | POA: Diagnosis not present

## 2020-03-31 NOTE — Progress Notes (Deleted)
Referring Provider: Samuella Bruin Primary Care Physician:  Shawnie Dapper, PA-C Primary GI Physician: Dr. Jena Gauss  No chief complaint on file.   HPI:   Frank Silva is a 55 y.o. male presenting today for follow-up. Last seen in our office in June 2021 at the time of initial consult for colonoscopy. Also reported intermittent toilet tissue hematochezia in the setting of hemorrhoids, LLQ abdominal pain in the setting of constipation or diarrhea, gnawing feeling in LUQ, intermittent RUQ pain with US abdomen in September 2020 without gallstones, history of GERD not on a PPI, intermittent nausea. Planned to start Protonix 40 mg daily, benefiber daily, pursue EGD and colonoscopy. Patient requested to continue Xarelto. Made aware procedures would only be diagnostic.   Unfortunately we were not able to reach patient to schedule procedures.   Today:    Past Medical History:  Diagnosis Date  . Allergy   . Atrial fibrillation (HCC)   . GERD (gastroesophageal reflux disease)   . Hyperlipidemia   . Hypertension   . IBS (irritable bowel syndrome)   . Migraines   . Neuromuscular disorder (HCC)   . Stroke [facility])    2014 or 2016    Past Surgical History:  Procedure Laterality Date  . CARDIAC CATHETERIZATION     age 56    Current Outpatient Medications  Medication Sig Dispense Refill  . acetaminophen (TYLENOL) 500 MG tablet Take 500 mg by mouth every 6 (six) hours as needed. (Patient not taking: Reported on 12/28/2019)    . albuterol (VENTOLIN HFA) 108 (90 Base) MCG/ACT inhaler INHALE TWO PUFFS BY MOUTH EVERY 6 HOURS AS NEEDED FOR WHEEZING (Patient taking differently: Inhale 1-2 puffs into the lungs every 6 (six) hours as needed for wheezing or shortness of breath. ) 8 g 3  . atorvastatin (LIPITOR) 10 MG tablet Take 1 tablet by mouth once daily 90 tablet 0  . Cholecalciferol (D3-1000) 25 MCG (1000 UT) tablet Take 1,000 Units by mouth daily.    Marland Kitchen co-enzyme Q-10 30 MG capsule  Take 7 capsules (210 mg total) by mouth 3 (three) times daily. (Patient taking differently: Take 100 mg by mouth daily. ) 60 capsule 1  . diltiazem (CARDIZEM CD) 180 MG 24 hr capsule Take 1 capsule by mouth once daily 90 capsule 0  . ECHINACEA-ZINC-VITAMIN C PO Take 1 tablet by mouth in the morning.    . fluticasone (FLONASE) 50 MCG/ACT nasal spray Place 1 spray into both nostrils daily.    . hydrochlorothiazide (HYDRODIURIL) 25 MG tablet Take 1 tablet (25 mg total) by mouth daily. 90 tablet 3  . pantoprazole (PROTONIX) 40 MG tablet Take 1 tablet (40 mg total) by mouth daily. 30 minutes before breakfast daily (Patient not taking: Reported on 12/28/2019) 30 tablet 3  . potassium chloride (KLOR-CON) 10 MEQ tablet Take 1 tablet (10 mEq total) by mouth daily for 4 days. 4 tablet 0  . vitamin B-12 (CYANOCOBALAMIN) 500 MCG tablet Take 500 mcg by mouth daily.    Carlena Hurl 20 MG TABS tablet TAKE 1 TABLET BY MOUTH ONCE DAILY . APPOINTMENT REQUIRED FOR FUTURE REFILLS (Patient taking differently: Take 20 mg by mouth in the morning. ) 60 tablet 0   No current facility-administered medications for this visit.    Allergies as of 04/02/2020 - Review Complete 01/16/2020  Allergen Reaction Noted  . Flexeril [cyclobenzaprine] Other (See Comments) 08/05/2011  . Peanut-containing drug products Hives and Shortness Of Breath 04/23/2012  . Benadryl [diphenhydramine]  10/19/2019  . Grapefruit bioflavonoid complex Other (See Comments) 02/17/2017  . Other Other (See Comments) 02/17/2017  . Prednisone Other (See Comments) 05/26/2016    Family History  Problem Relation Age of Onset  . CVA Father   . Colon cancer Neg Hx   . Colon polyps Neg Hx     Social History   Socioeconomic History  . Marital status: Married    Spouse name: [name]  . Number of children: 3  . Years of education: 68  . Highest education level: Not on file  Occupational History  . Occupation: [employer] doctor  Tobacco Use  . Smoking status:  Current Every Day Smoker    Packs/day: 1.00    Types: Cigarettes  . Smokeless tobacco: Never Used  Vaping Use  . Vaping Use: Never used  Substance and Sexual Activity  . Alcohol use: No    Alcohol/week: 0.0 standard drinks    Comment: none in 12 years  . Drug use: No  . Sexual activity: Yes  Other Topics Concern  . Not on file  Social History Narrative   Lives at home with wife.   Right Handed   Caffeine use: Drink coke (5-6 glasses per day)   Social Determinants of Health   Financial Resource Strain: Not on file  Food Insecurity: Not on file  Transportation Needs: Not on file  Physical Activity: Not on file  Stress: Not on file  Social Connections: Not on file    Review of Systems: Gen: Denies fever, chills, anorexia. Denies fatigue, weakness, weight loss.  CV: Denies chest pain, palpitations, syncope, peripheral edema, and claudication. Resp: Denies dyspnea at rest, cough, wheezing, coughing up blood, and pleurisy. GI: Denies vomiting blood, jaundice, and fecal incontinence.   Denies dysphagia or odynophagia. Derm: Denies rash, itching, dry skin Psych: Denies depression, anxiety, memory loss, confusion. No homicidal or suicidal ideation.  Heme: Denies bruising, bleeding, and enlarged lymph nodes.  Physical Exam: There were no vitals taken for this visit. General:   Alert and oriented. No distress noted. Pleasant and cooperative.  Head:  Normocephalic and atraumatic. Eyes:  Conjuctiva clear without scleral icterus. Mouth:  Oral mucosa pink and moist. Good dentition. No lesions. Heart:  S1, S2 present without murmurs appreciated. Lungs:  Clear to auscultation bilaterally. No wheezes, rales, or rhonchi. No distress.  Abdomen:  +BS, soft, non-tender and non-distended. No rebound or guarding. No HSM or masses noted. Msk:  Symmetrical without gross deformities. Normal posture. Extremities:  Without edema. Neurologic:  Alert and  oriented x4 Psych:  Alert and cooperative.  Normal mood and affect.

## 2020-04-02 ENCOUNTER — Ambulatory Visit: Payer: BC Managed Care – PPO | Admitting: Gastroenterology

## 2020-04-02 ENCOUNTER — Encounter: Payer: Self-pay | Admitting: Internal Medicine

## 2020-07-02 ENCOUNTER — Telehealth: Payer: Self-pay | Admitting: Cardiology

## 2020-07-02 NOTE — Telephone Encounter (Signed)
Left a message for the patient to call back to see if there was another pharmacy we could call the prescription into that may have it.

## 2020-07-02 NOTE — Telephone Encounter (Signed)
Pt c/o medication issue:  1. Name of Medication: diltiazem (CARDIZEM CD) 180 MG 24 hr capsule  2. How are you currently taking this medication (dosage and times per day)? As prescribed   3. Are you having a reaction (difficulty breathing--STAT)? No   4. What is your medication issue? Frank Silva is calling stating the pharmacy advised him it will be 1 month before they can get the 180 MG dosage tablets of this medication, and he only has 1 tablet left. He states they have 120 MG's in stock that he can pick up and he is wanting to know if he should do so and double it. Frank Silva was previously on this dosage and it was not strong enough to prevent his symptoms so he knows the 120 MG's will not be enough. Please advise.

## 2020-07-03 NOTE — Telephone Encounter (Signed)
    Pt is returning call, he said he works 3rd shift and if we callback his wife will answer phone and he said we can give information to his wife

## 2020-07-03 NOTE — Telephone Encounter (Signed)
Spoke with wife - she states that last summer he tried different brand of diltiazem but it caused him to have palpitations.    Unfortunately the Nancie Neas XT is unavailable from the manufacturer and calls to multiple pharmacies was not successful in finding any.   Advised wife that his best option is to try the other brand again, as we don't want him to go an entire month without medication.  (pharmacy notes release of Cartia to be late May).  Wife voiced understanding.

## 2020-07-03 NOTE — Telephone Encounter (Signed)
Spoke to pt's wife. She report pt's pharmacy is currently out of Cardizem 180 mg and won't receive a shipment until the end of the month. Wife report they have called several pharmacies and they are also out. Wife is questioning what they should do in the meantime as pt is currently out of medication.   Will forward to pharm D for recommendations.

## 2020-07-09 DIAGNOSIS — I10 Essential (primary) hypertension: Secondary | ICD-10-CM | POA: Diagnosis not present

## 2020-07-09 DIAGNOSIS — H6992 Unspecified Eustachian tube disorder, left ear: Secondary | ICD-10-CM | POA: Diagnosis not present

## 2020-07-09 DIAGNOSIS — Z6825 Body mass index (BMI) 25.0-25.9, adult: Secondary | ICD-10-CM | POA: Diagnosis not present

## 2020-07-09 DIAGNOSIS — Z1331 Encounter for screening for depression: Secondary | ICD-10-CM | POA: Diagnosis not present

## 2020-07-09 DIAGNOSIS — H6690 Otitis media, unspecified, unspecified ear: Secondary | ICD-10-CM | POA: Diagnosis not present

## 2020-11-07 DIAGNOSIS — I739 Peripheral vascular disease, unspecified: Secondary | ICD-10-CM | POA: Diagnosis not present

## 2020-11-07 DIAGNOSIS — I4891 Unspecified atrial fibrillation: Secondary | ICD-10-CM | POA: Diagnosis not present

## 2020-11-07 DIAGNOSIS — Z6825 Body mass index (BMI) 25.0-25.9, adult: Secondary | ICD-10-CM | POA: Diagnosis not present

## 2020-11-07 DIAGNOSIS — I10 Essential (primary) hypertension: Secondary | ICD-10-CM | POA: Diagnosis not present

## 2020-11-07 DIAGNOSIS — H6992 Unspecified Eustachian tube disorder, left ear: Secondary | ICD-10-CM | POA: Diagnosis not present

## 2020-11-07 DIAGNOSIS — I700 Atherosclerosis of aorta: Secondary | ICD-10-CM | POA: Diagnosis not present

## 2020-11-07 DIAGNOSIS — E663 Overweight: Secondary | ICD-10-CM | POA: Diagnosis not present

## 2020-11-07 DIAGNOSIS — J329 Chronic sinusitis, unspecified: Secondary | ICD-10-CM | POA: Diagnosis not present

## 2021-01-14 ENCOUNTER — Telehealth: Payer: Self-pay | Admitting: Cardiology

## 2021-01-14 DIAGNOSIS — I10 Essential (primary) hypertension: Secondary | ICD-10-CM

## 2021-01-14 MED ORDER — HYDROCHLOROTHIAZIDE 25 MG PO TABS: 25.0000 mg | ORAL_TABLET | Freq: Every day | ORAL | 0 refills | Status: DC

## 2021-01-14 NOTE — Telephone Encounter (Signed)
Spoke with pt. Pt is aware that Hydrochlorothiazide was sent to his pharmacy. Pt will keep upcoming appointment to receive additional refills.

## 2021-01-14 NOTE — Telephone Encounter (Signed)
*  STAT* If patient is at the pharmacy, call can be transferred to refill team.   1. Which medications need to be refilled? (please list name of each medication and dose if known)  hydrochlorothiazide (HYDRODIURIL) 25 MG tablet  2. Which pharmacy/location (including street and city if local pharmacy) is medication to be sent to?  [facility] [address] [place] - [address]  3. Do they need a 30 day or 90 day supply? 90

## 2021-01-14 NOTE — Addendum Note (Signed)
Addended by: Lamar Benes on: 01/14/2021 09:55 AM   Modules accepted: Orders

## 2021-02-18 NOTE — Progress Notes (Deleted)
Cardiology Office Note   Date:  02/18/2021   ID:  Frank Silva, DOB 1965-07-30, MRN [id number]  PCP:  Shawnie Dapper, PA-C  Cardiologist:  Winfield Rast No chief complaint on file.    History of Present Illness: Frank Silva is a 55 y.o. male who presents for ongoing assessment and management of paroxysmal atrial fibrillation, with history of a CVA and is on chronic Xarelto, hypertension, hyperlipidemia and ongoing tobacco abuse.  Echocardiogram in 202`1revealed normal LV function with grade 1 diastolic dysfunction.  Carotid Dopplers in 2021 showed 1 to 39% bilateral stenosis.  Most recent cardiac catheterization was in 2005 and showed no evidence of CAD.  He was last seen by Dr. [name] on 01/16/2020 post ED visit for chest pain.  He was found to be hypokalemic during ED evaluation.  He was repleted.  EKG during that most recent office visit revealed normal sinus rhythm he was continued on diltiazem Xarelto (lifelong).  He was continued on antihypertensive medications.  He was counseled on smoking cessation.  He requires refills on his medications.    Past Medical History:  Diagnosis Date   Allergy    Atrial fibrillation (HCC)    GERD (gastroesophageal reflux disease)    Hyperlipidemia    Hypertension    IBS (irritable bowel syndrome)    Migraines    Neuromuscular disorder (HCC)    Stroke (HCC)    2014 or 2016    Past Surgical History:  Procedure Laterality Date   CARDIAC CATHETERIZATION     age 64     Current Outpatient Medications  Medication Sig Dispense Refill   acetaminophen (TYLENOL) 500 MG tablet Take 500 mg by mouth every 6 (six) hours as needed. (Patient not taking: Reported on 12/28/2019)     albuterol (VENTOLIN HFA) 108 (90 Base) MCG/ACT inhaler INHALE TWO PUFFS BY MOUTH EVERY 6 HOURS AS NEEDED FOR WHEEZING (Patient taking differently: Inhale 1-2 puffs into the lungs every 6 (six) hours as needed for wheezing or shortness of breath. ) 8 g 3   atorvastatin  (LIPITOR) 10 MG tablet Take 1 tablet by mouth once daily 90 tablet 0   Cholecalciferol (D3-1000) 25 MCG (1000 UT) tablet Take 1,000 Units by mouth daily.     co-enzyme Q-10 30 MG capsule Take 7 capsules (210 mg total) by mouth 3 (three) times daily. (Patient taking differently: Take 100 mg by mouth daily. ) 60 capsule 1   diltiazem (CARDIZEM CD) 180 MG 24 hr capsule Take 1 capsule by mouth once daily 90 capsule 0   ECHINACEA-ZINC-VITAMIN C PO Take 1 tablet by mouth in the morning.     fluticasone (FLONASE) 50 MCG/ACT nasal spray Place 1 spray into both nostrils daily.     hydrochlorothiazide (HYDRODIURIL) 25 MG tablet Take 1 tablet (25 mg total) by mouth daily. 90 tablet 0   pantoprazole (PROTONIX) 40 MG tablet Take 1 tablet (40 mg total) by mouth daily. 30 minutes before breakfast daily (Patient not taking: Reported on 12/28/2019) 30 tablet 3   potassium chloride (KLOR-CON) 10 MEQ tablet Take 1 tablet (10 mEq total) by mouth daily for 4 days. 4 tablet 0   vitamin B-12 (CYANOCOBALAMIN) 500 MCG tablet Take 500 mcg by mouth daily.     XARELTO 20 MG TABS tablet TAKE 1 TABLET BY MOUTH ONCE DAILY . APPOINTMENT REQUIRED FOR FUTURE REFILLS (Patient taking differently: Take 20 mg by mouth in the morning. ) 60 tablet 0   No current facility-administered medications for this  visit.    Allergies:   Flexeril [cyclobenzaprine], Peanut-containing drug products, Benadryl [diphenhydramine], Grapefruit bioflavonoid complex, Other, and Prednisone    Social History:  The patient  reports that he has been smoking cigarettes. He has been smoking an average of 1 pack per day. He has never used smokeless tobacco. He reports that he does not drink alcohol and does not use drugs.   Family History:  The patient's family history includes CVA in his father.    ROS: All other systems are reviewed and negative. Unless otherwise mentioned in H&P    PHYSICAL EXAM: VS:  There were no vitals taken for this visit. , BMI  There is no height or weight on file to calculate BMI. GEN: Well nourished, well developed, in no acute distress HEENT: normal Neck: no JVD, carotid bruits, or masses Cardiac: ***RRR; no murmurs, rubs, or gallops,no edema  Respiratory:  Clear to auscultation bilaterally, normal work of breathing GI: soft, nontender, nondistended, + BS MS: no deformity or atrophy Skin: warm and dry, no rash Neuro:  Strength and sensation are intact Psych: euthymic mood, full affect   EKG:  EKG {ACTION; IS/IS [id number]} ordered today. The ekg ordered today demonstrates ***   Recent Labs: No results found for requested labs within last 8760 hours.    Lipid Panel    Component Value Date/Time   CHOL 110 11/08/2018 1016   CHOL 148 10/15/2017 1123   TRIG 85.0 11/08/2018 1016   HDL 41.90 11/08/2018 1016   HDL 48 10/15/2017 1123   CHOLHDL 3 11/08/2018 1016   VLDL 17.0 11/08/2018 1016   LDLCALC 51 11/08/2018 1016   LDLCALC 77 10/15/2017 1123   LDLDIRECT 58.0 11/08/2018 1016      Wt Readings from Last 3 Encounters:  01/16/20 176 lb 3.2 oz (79.9 kg)  12/28/19 172 lb (78 kg)  12/13/19 171 lb 6.4 oz (77.7 kg)      Other studies Reviewed: Echocardiogram 2020/03/07 1. Left ventricular ejection fraction, by estimation, is 60 to 65%. The  left ventricle has normal function. The left ventricle has no regional  wall motion abnormalities. Left ventricular diastolic parameters were  normal.   2. Right ventricular systolic function is normal. The right ventricular  size is normal. Tricuspid regurgitation signal is inadequate for assessing  PA pressure.   3. The mitral valve is normal in structure. No evidence of mitral valve  regurgitation. No evidence of mitral stenosis.   4. The aortic valve is tricuspid. Aortic valve regurgitation is not  visualized. No aortic stenosis is present.   5. The inferior vena cava is normal in size with greater than 50%  respiratory variability, suggesting right  atrial pressure of 3 mmHg.   ASSESSMENT AND PLAN:  1.  ***   Current medicines are reviewed at length with the patient today.  I have spent *** dedicated to the care of this patient on the date of this encounter to include pre-visit review of records, assessment, management and diagnostic testing,with shared decision making.  Labs/ tests ordered today include: *** Bettey Mare. Liborio Nixon, ANP, [facility]   02/18/2021 8:15 AM    [facility] [address] Office [phone] Fax [phone]  Notice: This dictation was prepared with Dragon dictation along with smaller phrase technology. Any transcriptional errors that result from this process are unintentional and may not be corrected upon review.

## 2021-02-22 ENCOUNTER — Ambulatory Visit: Payer: BC Managed Care – PPO | Admitting: Adult Health

## 2021-03-19 DIAGNOSIS — J329 Chronic sinusitis, unspecified: Secondary | ICD-10-CM | POA: Diagnosis not present

## 2021-03-24 NOTE — Progress Notes (Deleted)
Office Visit    Patient Name: Frank Silva Date of Encounter: 03/24/2021  PCP:  Samuella Bruin   [facility]  Cardiologist:  Olga Millers, MD  Advanced Practice Provider:  No care team member to display Electrophysiologist:  None    Chief Complaint    Frank ROSSELLI is a 56 y.o. male with a hx of PAF, CAD and is on chronic Xarelto presents today for follow-up for atrial fibrillation.  Past Medical History    Past Medical History:  Diagnosis Date   Allergy    Atrial fibrillation (HCC)    GERD (gastroesophageal reflux disease)    Hyperlipidemia    Hypertension    IBS (irritable bowel syndrome)    Migraines    Neuromuscular disorder (HCC)    Stroke (HCC)    2014 or 2016   Past Surgical History:  Procedure Laterality Date   CARDIAC CATHETERIZATION     age 66    Allergies  Allergies  Allergen Reactions   Flexeril [Cyclobenzaprine] Other (See Comments)    Heart race   Peanut-Containing Drug Products Hives and Shortness Of Breath   Benadryl [Diphenhydramine]     jittery   Grapefruit Bioflavonoid Complex Other (See Comments)    Interaction with medication   Other Other (See Comments)    Paprika, med interaction can throw heat out of rhythm    Prednisone Other (See Comments)    Tremors    History of Present Illness    Frank Silva is a 56 y.o. male with a hx of PAF, CAD and is on chronic Xarelto presents today for follow-up for atrial fibrillation last seen 01/09/2020 by Dr. [name].  He had a cardiac catheterization in 2005 which showed no CAD.  Echocardiogram February 2016 showed normal LV function, grade 1 DD.  ABIs with Doppler November 2019 showed no significant arterial occlusive disease.  Carotid Dopplers 11/21 showed 1 to 39% bilateral stenosis.  Patient was seen in the emergency room October 2021 with palpitations.  He was concerned he may be in atrial relation.  He was found to be in sinus rhythm and was asked to  follow-up with cardiology.  Potassium was 3.1, creatinine 1.05, hemoglobin 16.7, and troponins normal x2.  When he was last seen by Dr. [name] he denied dyspnea, chest pain, syncope or bleeding.  He did have occasional palpitations described as a skipped beat.  This was the reason for the recent ER visit.  This improved with potassium supplementation.  He underwent an echocardiogram 02/2020 which showed estimated LVEF 60 to 65%.  No valvular disease.  Coronary CTA was also performed which showed a coronary calcium score of 0, mild aortic atherosclerosis was noted.  Today, he ***    EKGs/Labs/Other Studies Reviewed:   The following studies were reviewed today:  Coronary CTA 02/13/2020  EXAM: OVER-READ INTERPRETATION  CT CHEST   The following report is an over-read performed by radiologist Dr. Trudie Reed of [facility] on 02/13/2020. This over-read does not include interpretation of cardiac or coronary anatomy or pathology. The coronary calcium score/coronary CTA interpretation by the cardiologist is attached.   COMPARISON:  None.   FINDINGS: Aortic atherosclerosis. Within the visualized portions of the thorax there are no suspicious appearing pulmonary nodules or masses, there is no acute consolidative airspace disease, no pleural effusions, no pneumothorax and no lymphadenopathy. Visualized portions of the upper abdomen are unremarkable. There are no aggressive appearing lytic or blastic lesions noted  in the visualized portions of the skeleton.   IMPRESSION: 1.  Aortic Atherosclerosis (ICD10-I70.0).   Electronically Signed: By: Trudie Reedaniel  Entrikin M.D. On: 02/13/2020 09:47  Echocardiogram 02/13/2020  IMPRESSIONS     1. Left ventricular ejection fraction, by estimation, is 60 to 65%. The  left ventricle has normal function. The left ventricle has no regional  wall motion abnormalities. Left ventricular diastolic parameters were  normal.   2. Right  ventricular systolic function is normal. The right ventricular  size is normal. Tricuspid regurgitation signal is inadequate for assessing  PA pressure.   3. The mitral valve is normal in structure. No evidence of mitral valve  regurgitation. No evidence of mitral stenosis.   4. The aortic valve is tricuspid. Aortic valve regurgitation is not  visualized. No aortic stenosis is present.   5. The inferior vena cava is normal in size with greater than 50%  respiratory variability, suggesting right atrial pressure of 3 mmHg.   FINDINGS   Left Ventricle: Left ventricular ejection fraction, by estimation, is 60  to 65%. The left ventricle has normal function. The left ventricle has no  regional wall motion abnormalities. The left ventricular internal cavity  size was normal in size. There is   no left ventricular hypertrophy. Left ventricular diastolic parameters  were normal.   Right Ventricle: The right ventricular size is normal. No increase in  right ventricular wall thickness. Right ventricular systolic function is  normal. Tricuspid regurgitation signal is inadequate for assessing PA  pressure.   Left Atrium: Left atrial size was normal in size.   Right Atrium: Right atrial size was normal in size.   Pericardium: There is no evidence of pericardial effusion.   Mitral Valve: The mitral valve is normal in structure. No evidence of  mitral valve regurgitation. No evidence of mitral valve stenosis.   Tricuspid Valve: The tricuspid valve is normal in structure. Tricuspid  valve regurgitation is not demonstrated.   Aortic Valve: The aortic valve is tricuspid. Aortic valve regurgitation is  not visualized. No aortic stenosis is present.   Pulmonic Valve: The pulmonic valve was normal in structure. Pulmonic valve  regurgitation is not visualized.   Aorta: The aortic root is normal in size and structure.   Venous: The inferior vena cava is normal in size with greater than 50%   respiratory variability, suggesting right atrial pressure of 3 mmHg.   IAS/Shunts: No atrial level shunt detected by color flow Doppler.   EKG:  EKG is *** ordered today.  The ekg ordered today demonstrates ***  Recent Labs: No results found for requested labs within last 8760 hours.  Recent Lipid Panel    Component Value Date/Time   CHOL 110 11/08/2018 1016   CHOL 148 10/15/2017 1123   TRIG 85.0 11/08/2018 1016   HDL 41.90 11/08/2018 1016   HDL 48 10/15/2017 1123   CHOLHDL 3 11/08/2018 1016   VLDL 17.0 11/08/2018 1016   LDLCALC 51 11/08/2018 1016   LDLCALC 77 10/15/2017 1123   LDLDIRECT 58.0 11/08/2018 1016    Risk Assessment/Calculations:  {Does this patient have ATRIAL FIBRILLATION?[phone]}  Home Medications   No outpatient medications have been marked as taking for the 03/25/21 encounter (Appointment) with Alver SorrowWalker, Caitlin S, NP.     Review of Systems   ***   All other systems reviewed and are otherwise negative except as noted above.  Physical Exam    VS:  There were no vitals taken for this visit. , BMI There  is no height or weight on file to calculate BMI.  Wt Readings from Last 3 Encounters:  01/16/20 176 lb 3.2 oz (79.9 kg)  12/28/19 172 lb (78 kg)  12/13/19 171 lb 6.4 oz (77.7 kg)     GEN: Well nourished, well developed, in no acute distress. HEENT: normal. Neck: Supple, no JVD, carotid bruits, or masses. Cardiac: ***RRR, no murmurs, rubs, or gallops. No clubbing, cyanosis, edema.  ***Radials/PT 2+ and equal bilaterally.  Respiratory:  ***Respirations regular and unlabored, clear to auscultation bilaterally. GI: Soft, nontender, nondistended. MS: No deformity or atrophy. Skin: Warm and dry, no rash. Neuro:  Strength and sensation are intact. Psych: Normal affect.  Assessment & Plan    Paroxysmal atrial fibrillation -Continue Xarelto for anticoagulation he will need this lifelong due to history of CVA -Continue Cardizem for rate  control  Hypertension -Well-controlled today in the clinic -Continue current medication regimen which includes Cardizem, HCTZ  Hyperlipidemia -Continue Lipitor 10 mg daily -Last lipid panel was 10/2018 with total cholesterol 110, LDL 51, HDL 41, and triglycerides 85 -We will need to obtain another lipid panel if not already done  Tobacco abuse -cessation advised     Disposition: Follow up {follow up:15908} with Olga Millers, MD or APP.  Signed, Sharlene Dory, PA-C 03/24/2021, 6:49 PM  [facility]

## 2021-03-25 ENCOUNTER — Ambulatory Visit (HOSPITAL_BASED_OUTPATIENT_CLINIC_OR_DEPARTMENT_OTHER): Payer: BC Managed Care – PPO | Admitting: Physician Assistant

## 2021-03-25 DIAGNOSIS — Z720 Tobacco use: Secondary | ICD-10-CM

## 2021-03-25 DIAGNOSIS — I10 Essential (primary) hypertension: Secondary | ICD-10-CM

## 2021-03-25 DIAGNOSIS — E782 Mixed hyperlipidemia: Secondary | ICD-10-CM

## 2021-03-25 DIAGNOSIS — I480 Paroxysmal atrial fibrillation: Secondary | ICD-10-CM

## 2021-04-02 DIAGNOSIS — R202 Paresthesia of skin: Secondary | ICD-10-CM | POA: Diagnosis not present

## 2021-04-02 DIAGNOSIS — I10 Essential (primary) hypertension: Secondary | ICD-10-CM | POA: Diagnosis not present

## 2021-04-02 DIAGNOSIS — I4891 Unspecified atrial fibrillation: Secondary | ICD-10-CM | POA: Diagnosis not present

## 2021-04-02 DIAGNOSIS — R197 Diarrhea, unspecified: Secondary | ICD-10-CM | POA: Diagnosis not present

## 2021-04-11 ENCOUNTER — Telehealth: Payer: Self-pay | Admitting: Cardiology

## 2021-04-11 NOTE — Telephone Encounter (Signed)
Spoke with patient who reports having side effects of decreased libido while taking hydrochlorothiazide. Also wants better control of his blood pressure. Denies chest pain, sob. He wants to have an appointment with Dr. [name] to discuss all medications. Appointment made for 2/24.

## 2021-04-11 NOTE — Telephone Encounter (Signed)
Pt c/o BP issue: STAT if pt c/o blurred vision, one-sided weakness or slurred speech  1. What are your last 5 BP readings? 141/92 , 139/87  2. Are you having any other symptoms (ex. Dizziness, headache, blurred vision, passed out)? no  3. What is your BP issue? Bp is not being regulated by his meds.. please advise

## 2021-04-23 NOTE — Progress Notes (Signed)
HPI:FU atrial fibrillation. Patient has a history of paroxysmal atrial fibrillation, CVA and is on chronic Xarelto. Cath 2005 showed no CAD  ABIs with Doppler November 2019 showed no significant arterial occlusive disease. Carotid Dopplers 11/21 showed 1 to 39% bilateral stenosis. Echocardiogram December 2021 showed normal LV function.  Calcium score December 2021 0; aortic atherosclerosis noted.  Since last seen he has some dyspnea on exertion but no orthopnea, PND, pedal edema, chest pain palpitations or syncope.  Current Outpatient Medications  Medication Sig Dispense Refill   acetaminophen (TYLENOL) 500 MG tablet Take 500 mg by mouth every 6 (six) hours as needed.     albuterol (VENTOLIN HFA) 108 (90 Base) MCG/ACT inhaler INHALE TWO PUFFS BY MOUTH EVERY 6 HOURS AS NEEDED FOR WHEEZING (Patient taking differently: Inhale 1-2 puffs into the lungs every 6 (six) hours as needed for wheezing or shortness of breath.) 8 g 3   atorvastatin (LIPITOR) 10 MG tablet Take 1 tablet by mouth once daily 90 tablet 0   Bioflavonoid Products (SUPER-C 1000 PO) Take by mouth.     Cholecalciferol (D3-1000) 25 MCG (1000 UT) tablet Take 1,000 Units by mouth daily.     diltiazem (CARDIZEM CD) 180 MG 24 hr capsule Take 1 capsule by mouth once daily 90 capsule 0   ECHINACEA-ZINC-VITAMIN C PO Take 1 tablet by mouth in the morning.     fluticasone (FLONASE) 50 MCG/ACT nasal spray Place 1 spray into both nostrils daily.     hydrochlorothiazide (HYDRODIURIL) 25 MG tablet Take 1 tablet (25 mg total) by mouth daily. 90 tablet 0   XARELTO 20 MG TABS tablet TAKE 1 TABLET BY MOUTH ONCE DAILY . APPOINTMENT REQUIRED FOR FUTURE REFILLS (Patient taking differently: Take 20 mg by mouth in the morning.) 60 tablet 0   potassium chloride (KLOR-CON) 10 MEQ tablet Take 1 tablet (10 mEq total) by mouth daily for 4 days. 4 tablet 0   No current facility-administered medications for this visit.     Past Medical History:  Diagnosis  Date   Allergy    Atrial fibrillation (HCC)    GERD (gastroesophageal reflux disease)    Hyperlipidemia    Hypertension    IBS (irritable bowel syndrome)    Migraines    Neuromuscular disorder ([place])    Stroke ([place])    2014 or 2016    Past Surgical History:  Procedure Laterality Date   CARDIAC CATHETERIZATION     age 56    Social History   Socioeconomic History   Marital status: Married    Spouse name: [name]   Number of children: 3   Years of education: 12   Highest education level: Not on file  Occupational History   Occupation: Truck driver  Tobacco Use   Smoking status: Every Day    Packs/day: 1.00    Types: Cigarettes   Smokeless tobacco: Never  Vaping Use   Vaping Use: Never used  Substance and Sexual Activity   Alcohol use: No    Alcohol/week: 0.0 standard drinks    Comment: none in 12 years   Drug use: No   Sexual activity: Yes  Other Topics Concern   Not on file  Social History Narrative   Lives at home with wife.   Right Handed   Caffeine use: Drink coke (5-6 glasses per day)   Social Determinants of Health   Financial Resource Strain: Not on file  Food Insecurity: Not on file  Transportation Needs: Not on  file  Physical Activity: Not on file  Stress: Not on file  Social Connections: Not on file  Intimate Partner Violence: Not on file    Family History  Problem Relation Age of Onset   CVA Father    Colon cancer Neg Hx    Colon polyps Neg Hx     ROS: no fevers or chills, productive cough, hemoptysis, dysphasia, odynophagia, melena, hematochezia, dysuria, hematuria, rash, seizure activity, orthopnea, PND, pedal edema, claudication. Remaining systems are negative.  Physical Exam: Well-developed well-nourished in no acute distress.  Skin is warm and dry.  HEENT is normal.  Neck is supple.  Chest is clear to auscultation with normal expansion.  Cardiovascular exam is regular rate and rhythm.  Abdominal exam nontender or distended. No  masses palpated. Extremities show no edema. neuro grossly intact  ECG-normal sinus rhythm at a rate of 70, left anterior fascicular block, no ST changes.  Personally reviewed  A/P  1 paroxysmal atrial fibrillation-patient is in sinus rhythm today.  Continue Cardizem at present dose.  Continue Xarelto.  If he has more frequent episodes in the future we will consider addition of antiarrhythmic versus referral for ablation.  Check hemoglobin and renal function.  2 hypertension-patient's blood pressure is controlled today.  However the HCTZ is causing difficulties with urination.  We will discontinue that as well as KCl.  Follow blood pressure and we can increase Cardizem if needed.  3 tobacco abuse-we again discussed the importance of discontinuing.  4 hyperlipidemia-continue statin.  Check lipids and liver.  Kirk Ruths, MD

## 2021-05-03 ENCOUNTER — Encounter: Payer: Self-pay | Admitting: Cardiology

## 2021-05-03 ENCOUNTER — Other Ambulatory Visit: Payer: Self-pay

## 2021-05-03 ENCOUNTER — Ambulatory Visit: Payer: BC Managed Care – PPO | Admitting: Cardiology

## 2021-05-03 VITALS — BP 128/82 | HR 70 | Resp 20 | Ht 68.0 in | Wt 166.8 lb

## 2021-05-03 DIAGNOSIS — I10 Essential (primary) hypertension: Secondary | ICD-10-CM

## 2021-05-03 DIAGNOSIS — E7800 Pure hypercholesterolemia, unspecified: Secondary | ICD-10-CM

## 2021-05-03 DIAGNOSIS — I480 Paroxysmal atrial fibrillation: Secondary | ICD-10-CM

## 2021-05-03 NOTE — Patient Instructions (Signed)
Medication Instructions:   STOP POTASSIUM  STOP HCTZ  *If you need a refill on your cardiac medications before your next appointment, please call your pharmacy*   Lab Work:  Your physician recommends that you return for lab work FASTING  If you have labs (blood work) drawn today and your tests are completely normal, you will receive your results only by: MyChart Message (if you have MyChart) OR A paper copy in the mail If you have any lab test that is abnormal or we need to change your treatment, we will call you to review the results.   Follow-Up: At [facility], you and your health needs are our priority.  As part of our continuing mission to provide you with exceptional heart care, we have created designated Provider Care Teams.  These Care Teams include your primary Cardiologist (physician) and Advanced Practice Providers (APPs -  Physician Assistants and Nurse Practitioners) who all work together to provide you with the care you need, when you need it.  We recommend signing up for the patient portal called "MyChart".  Sign up information is provided on this After Visit Summary.  MyChart is used to connect with patients for Virtual Visits (Telemedicine).  Patients are able to view lab/test results, encounter notes, upcoming appointments, etc.  Non-urgent messages can be sent to your provider as well.   To learn more about what you can do with MyChart, go to [email or web address].    Your next appointment:   12 month(s)  The format for your next appointment:   In Person  Provider:   Olga Millers, MD     Other Instructions  TRACK BLOOD PRESSURE-GOAL <130/85

## 2021-05-11 ENCOUNTER — Other Ambulatory Visit: Payer: Self-pay | Admitting: Cardiology

## 2021-05-11 DIAGNOSIS — I10 Essential (primary) hypertension: Secondary | ICD-10-CM

## 2021-05-13 ENCOUNTER — Encounter: Payer: Self-pay | Admitting: Cardiology

## 2021-05-14 ENCOUNTER — Other Ambulatory Visit: Payer: Self-pay

## 2021-05-22 ENCOUNTER — Encounter: Payer: Self-pay | Admitting: Cardiology

## 2021-05-27 ENCOUNTER — Encounter: Payer: Self-pay | Admitting: *Deleted

## 2021-06-03 ENCOUNTER — Ambulatory Visit: Payer: BC Managed Care – PPO | Admitting: Physician Assistant

## 2021-06-03 IMAGING — DX DG CHEST 1V PORT
1 series · 1 of 1 positions shown · non-contrast
Comparison: 11/19/2018

CLINICAL DATA: Chest pain.  AFib.

EXAM:
PORTABLE CHEST 1 VIEW

[chest ap]
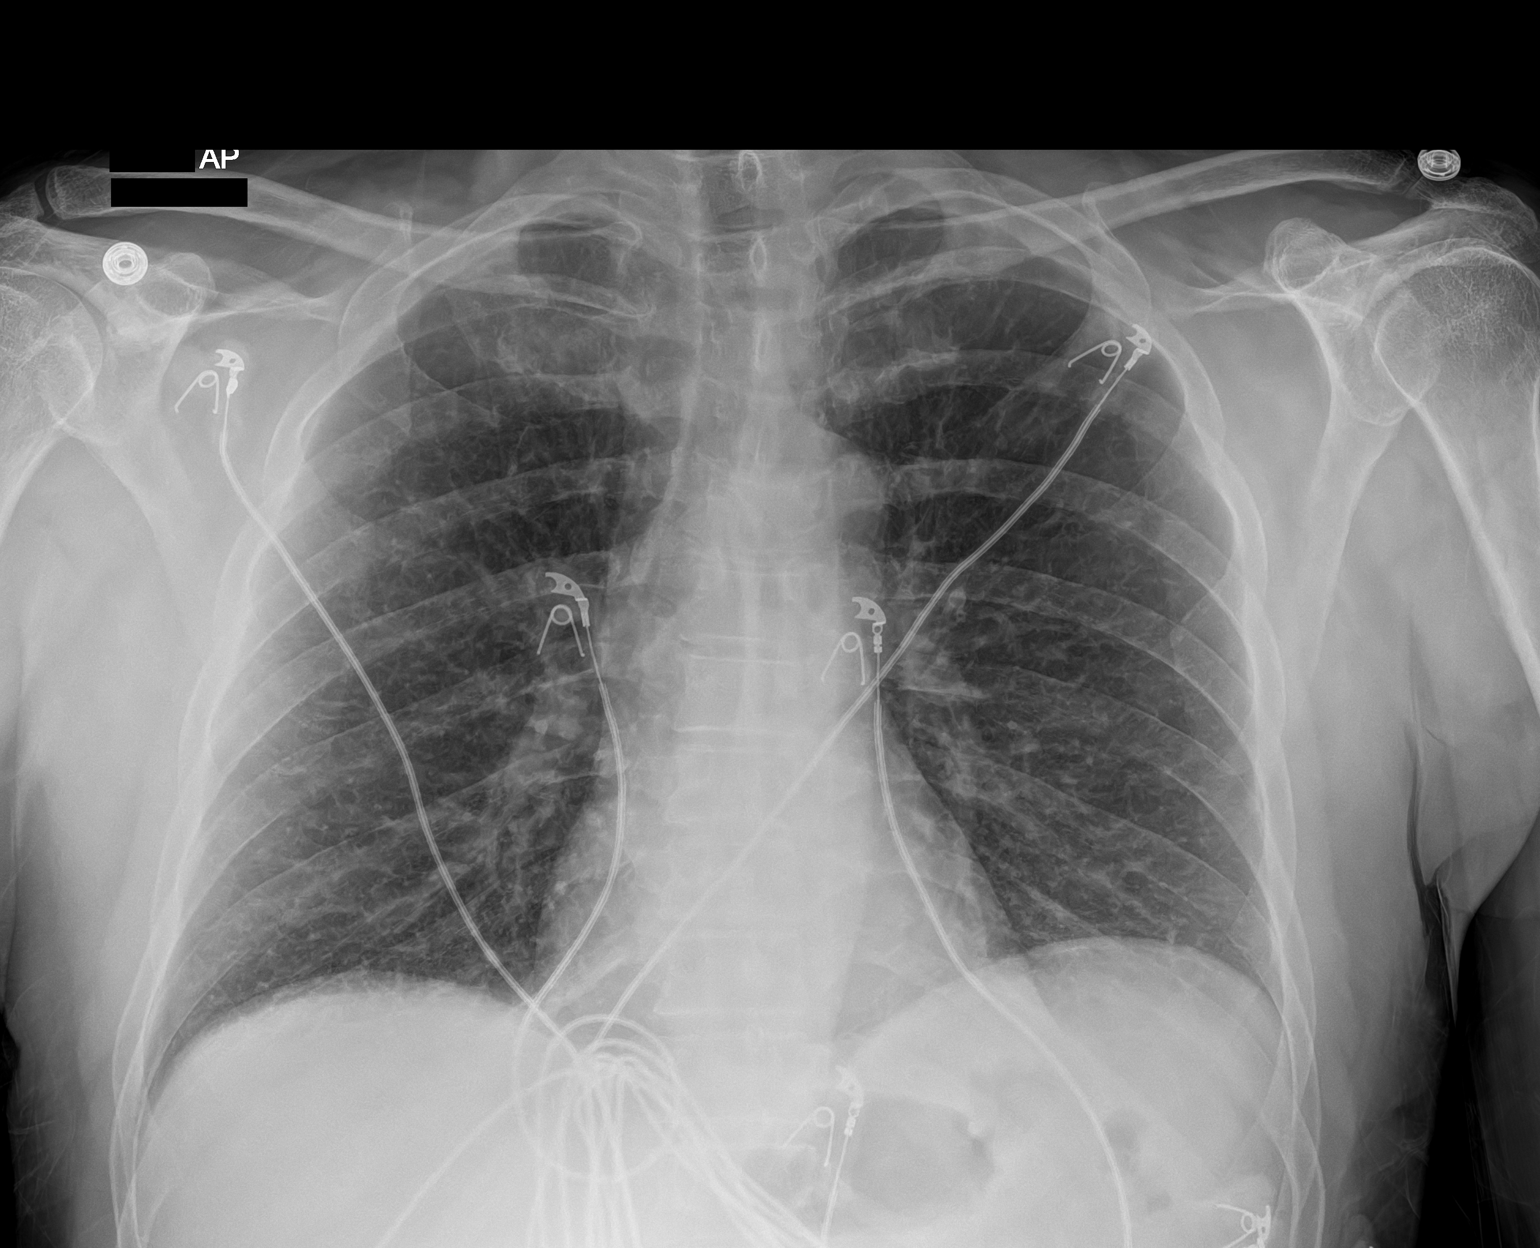

[1 of 1 positions shown; findings below may reference images not displayed]

FINDINGS: The heart size and mediastinal contours are within normal limits.
Both lungs are clear. No visible pleural effusions or pneumothorax.
No acute osseous abnormality.
IMPRESSION: No acute cardiopulmonary disease.

## 2021-06-06 DIAGNOSIS — E7800 Pure hypercholesterolemia, unspecified: Secondary | ICD-10-CM | POA: Diagnosis not present

## 2021-06-06 DIAGNOSIS — I480 Paroxysmal atrial fibrillation: Secondary | ICD-10-CM | POA: Diagnosis not present

## 2021-06-07 ENCOUNTER — Encounter: Payer: Self-pay | Admitting: Cardiology

## 2021-06-07 DIAGNOSIS — Z8673 Personal history of transient ischemic attack (TIA), and cerebral infarction without residual deficits: Secondary | ICD-10-CM

## 2021-06-07 DIAGNOSIS — I739 Peripheral vascular disease, unspecified: Secondary | ICD-10-CM

## 2021-06-07 DIAGNOSIS — I480 Paroxysmal atrial fibrillation: Secondary | ICD-10-CM

## 2021-06-07 LAB — COMPREHENSIVE METABOLIC PANEL
ALT: 22 IU/L (ref 0–44)
AST: 18 IU/L (ref 0–40)
Albumin/Globulin Ratio: 1.8 (ref 1.2–2.2)
Albumin: 4.3 g/dL (ref 3.8–4.9)
Alkaline Phosphatase: 70 IU/L (ref 44–121)
BUN/Creatinine Ratio: 12 (ref 9–20)
BUN: 14 mg/dL (ref 6–24)
Bilirubin Total: 0.4 mg/dL (ref 0.0–1.2)
CO2: 28 mmol/L (ref 20–29)
Calcium: 9.5 mg/dL (ref 8.7–10.2)
Chloride: 101 mmol/L (ref 96–106)
Creatinine, Ser: 1.13 mg/dL (ref 0.76–1.27)
Globulin, Total: 2.4 g/dL (ref 1.5–4.5)
Glucose: 96 mg/dL (ref 70–99)
Potassium: 3.8 mmol/L (ref 3.5–5.2)
Sodium: 142 mmol/L (ref 134–144)
Total Protein: 6.7 g/dL (ref 6.0–8.5)
eGFR: 76 mL/min/{1.73_m2} (ref >59)

## 2021-06-07 LAB — CBC
Hematocrit: 50.2 % (ref 37.5–51.0)
Hemoglobin: 16.9 g/dL (ref 13.0–17.7)
MCH: 32.4 pg (ref 26.6–33.0)
MCHC: 33.7 g/dL (ref 31.5–35.7)
MCV: 96 fL (ref 79–97)
Platelets: 233 10*3/uL (ref 150–450)
RBC: 5.21 x10E6/uL (ref 4.14–5.80)
RDW: 13.2 % (ref 11.6–15.4)
WBC: 7.7 10*3/uL (ref 3.4–10.8)

## 2021-06-07 LAB — LIPID PANEL
Chol/HDL Ratio: 2.6 ratio (ref 0.0–5.0)
Cholesterol, Total: 125 mg/dL (ref 100–199)
HDL: 48 mg/dL (ref >39)
LDL Chol Calc (NIH): 60 mg/dL (ref 0–99)
Triglycerides: 89 mg/dL (ref 0–149)
VLDL Cholesterol Cal: 17 mg/dL (ref 5–40)

## 2021-06-10 MED ORDER — ATORVASTATIN CALCIUM 10 MG PO TABS: 10.0000 mg | ORAL_TABLET | Freq: Every day | ORAL | 3 refills | Status: DC

## 2021-06-10 MED ORDER — DILTIAZEM HCL ER COATED BEADS 180 MG PO CP24: 180.0000 mg | ORAL_CAPSULE | Freq: Every day | ORAL | 3 refills | Status: DC

## 2021-06-10 MED ORDER — ALBUTEROL SULFATE HFA 108 (90 BASE) MCG/ACT IN AERS: INHALATION_SPRAY | RESPIRATORY_TRACT | 3 refills | Status: DC

## 2021-06-10 MED ORDER — RIVAROXABAN 20 MG PO TABS: 20.0000 mg | ORAL_TABLET | Freq: Every day | ORAL | 3 refills | Status: DC

## 2021-07-20 IMAGING — CT CT CARDIAC CORONARY ARTERY CALCIUM SCORE
3 series · 14 of 20 positions shown, 15 images · non-contrast
Comparison: None.
COMPARISON: None.

Addendum:
EXAM:
OVER-READ INTERPRETATION  CT CHEST

The following report is an over-read performed by radiologist Dr.
Jamika Carrion [REDACTED] on 02/13/2020. This
over-read does not include interpretation of cardiac or coronary
anatomy or pathology. The coronary calcium score/coronary CTA
interpretation by the cardiologist is attached.
CLINICAL DATA: Risk stratification
Coronary Calcium Score
TECHNIQUE: The patient was scanned on a Siemens Somatom 64 slice scanner. Axial
non-contrast 3 mm slices were carried out through the heart. The
data set was analyzed on a dedicated work station and scored using
the Agatson method.

[Series 2: casc 3.0 bv41 2 bestdiast 69 % · axial · 0.31mm/px · z∈[-266,-185]mm · 4 of 47 slices shown, 5 images]
[im 10/47  vessel]
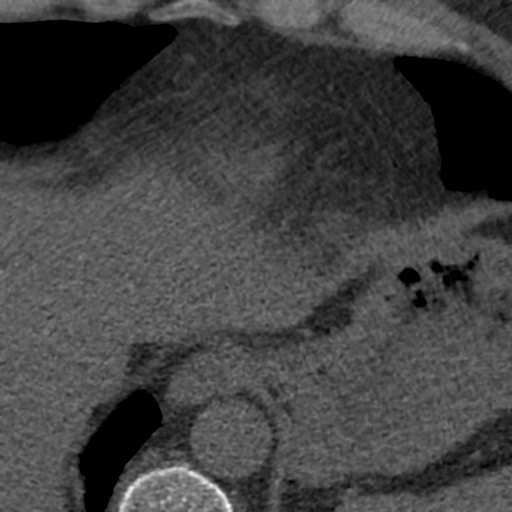
[im 10/47  lung]
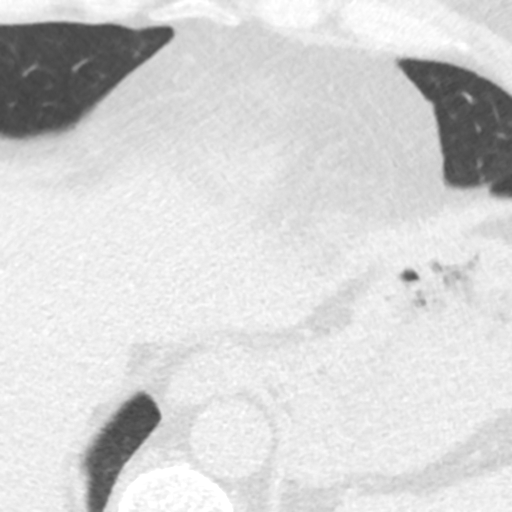
[im 19/47  vessel]
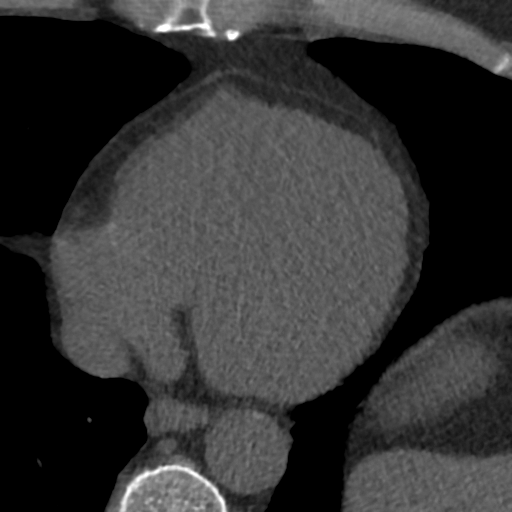
[im 28/47  vessel]
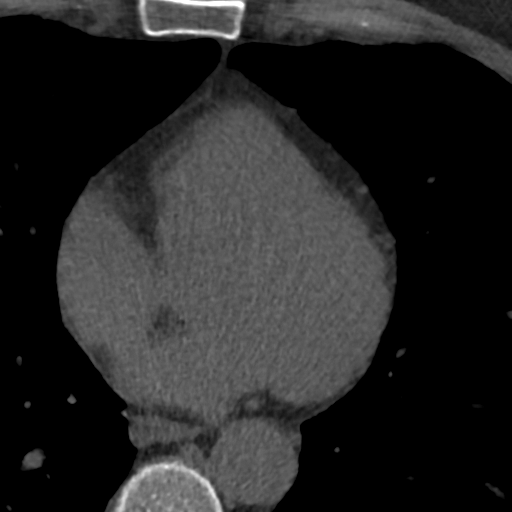
[im 37/47  vessel]
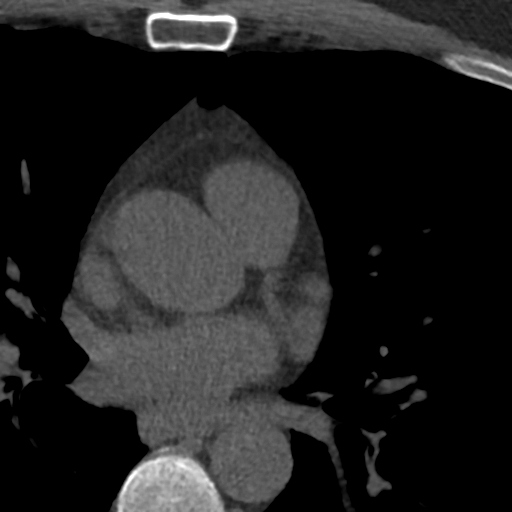

[Series 3: lung 70 % · axial · 0.69mm/px · z∈[-272,-178]mm · 5 of 47 slices shown]
[im 8/47  lung]
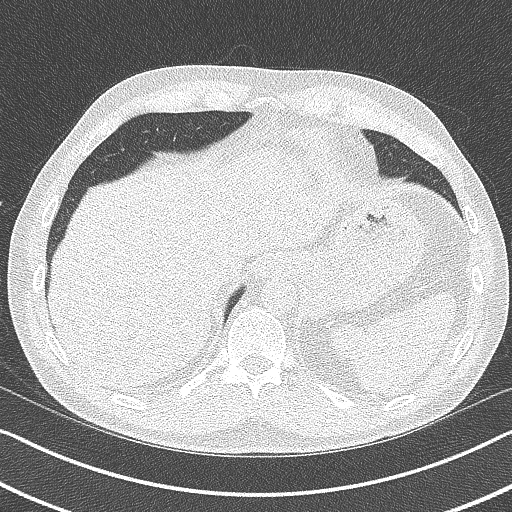
[im 16/47  lung]
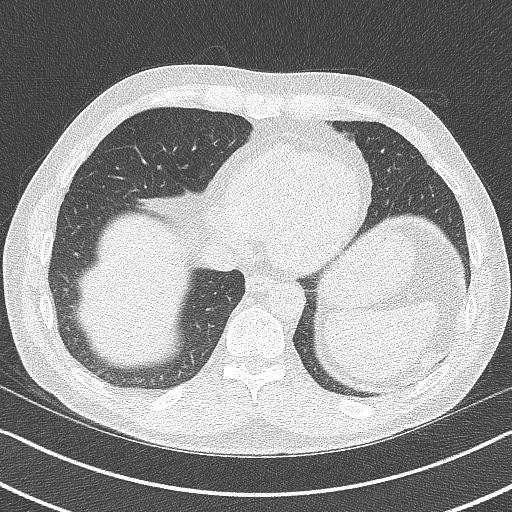
[im 24/47  lung]
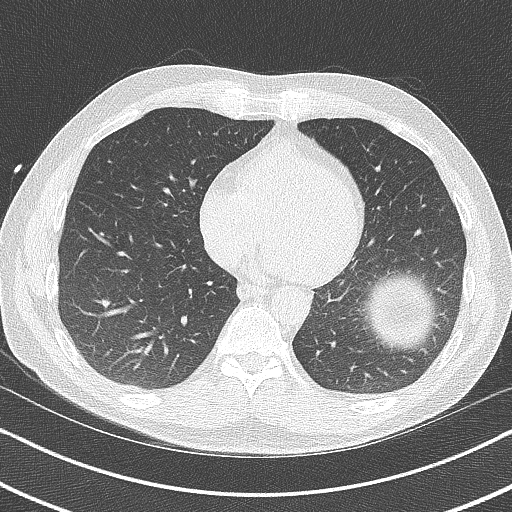
[im 31/47  lung]
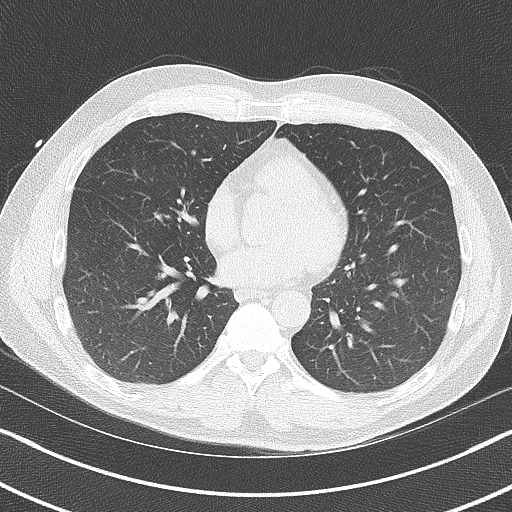
[im 39/47  lung]
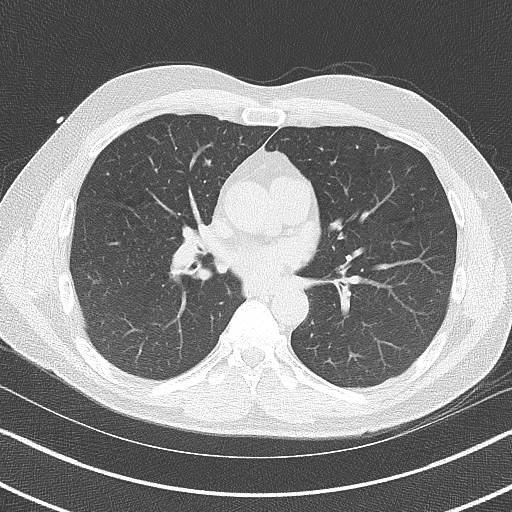

[Series 4: lung st 70 % · axial · 0.69mm/px · z∈[-272,-178]mm · 5 of 47 slices shown]
[im 8/47  lung]
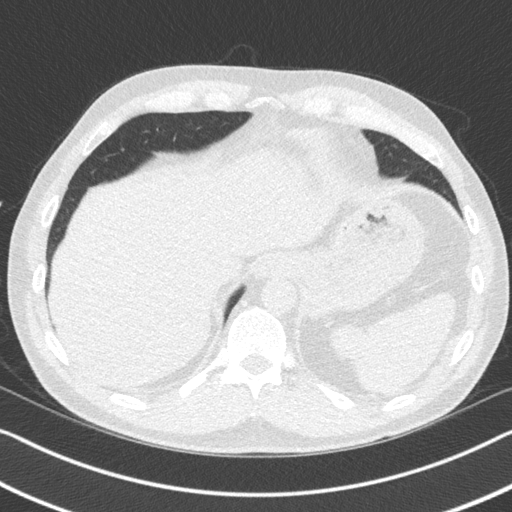
[im 16/47  lung]
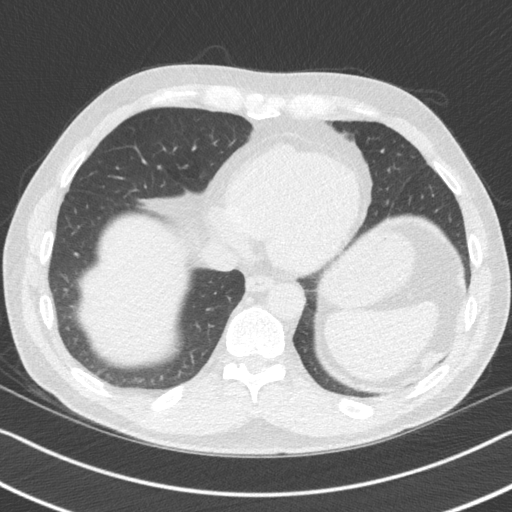
[im 24/47  lung]
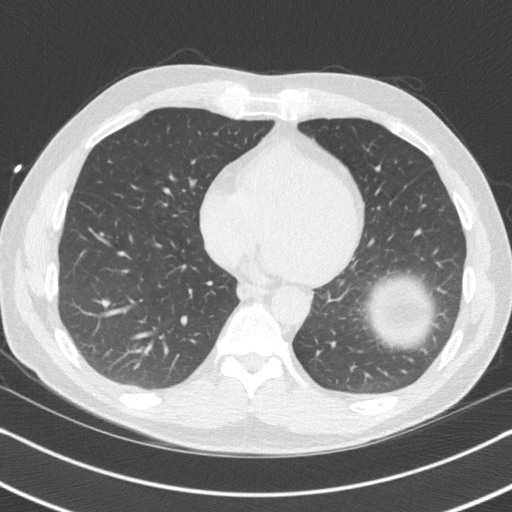
[im 31/47  lung]
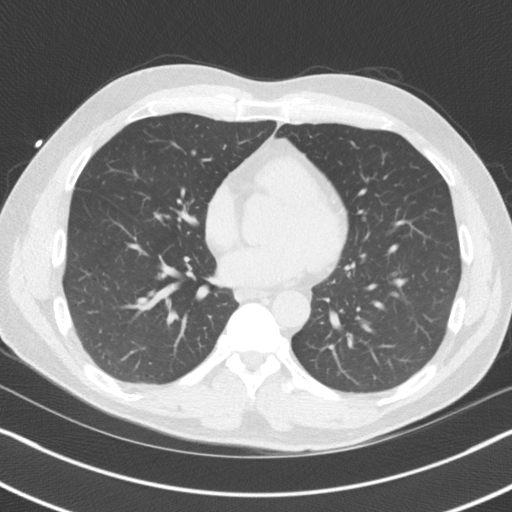
[im 39/47  lung]
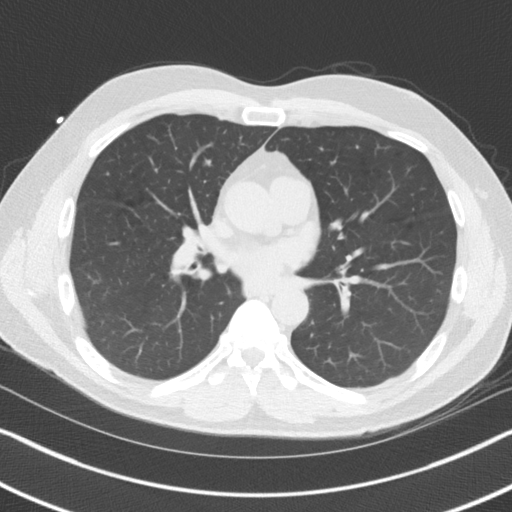

[14 of 20 positions shown; findings below may reference images not displayed]

FINDINGS: Aortic atherosclerosis. Within the visualized portions of the thorax
there are no suspicious appearing pulmonary nodules or masses, there
is no acute consolidative airspace disease, no pleural effusions, no
pneumothorax and no lymphadenopathy. Visualized portions of the
upper abdomen are unremarkable. There are no aggressive appearing
lytic or blastic lesions noted in the visualized portions of the
skeleton.
IMPRESSION: 1.  Aortic Atherosclerosis (7GWPX-SLW.W).
FINDINGS: Non-cardiac: See separate report from [REDACTED].

Ascending aorta: Normal size; mild aortic atherosclerosis noted

Pericardium: Normal

Coronary arteries: Normal origin
IMPRESSION: Coronary calcium score of 0. This was 0 percentile for age and sex
matched control.

Berdax Grom

*** End of Addendum ***
EXAM:
OVER-READ INTERPRETATION  CT CHEST

The following report is an over-read performed by radiologist Dr.
Jamika Carrion [REDACTED] on 02/13/2020. This
over-read does not include interpretation of cardiac or coronary
anatomy or pathology. The coronary calcium score/coronary CTA
interpretation by the cardiologist is attached.
FINDINGS: Aortic atherosclerosis. Within the visualized portions of the thorax
there are no suspicious appearing pulmonary nodules or masses, there
is no acute consolidative airspace disease, no pleural effusions, no
pneumothorax and no lymphadenopathy. Visualized portions of the
upper abdomen are unremarkable. There are no aggressive appearing
lytic or blastic lesions noted in the visualized portions of the
skeleton.
IMPRESSION: 1.  Aortic Atherosclerosis (7GWPX-SLW.W).

## 2021-09-14 ENCOUNTER — Encounter: Payer: Self-pay | Admitting: Cardiology

## 2021-09-14 DIAGNOSIS — Z8673 Personal history of transient ischemic attack (TIA), and cerebral infarction without residual deficits: Secondary | ICD-10-CM

## 2021-09-14 DIAGNOSIS — I480 Paroxysmal atrial fibrillation: Secondary | ICD-10-CM

## 2021-09-16 MED ORDER — DILTIAZEM HCL ER COATED BEADS 360 MG PO CP24: 360.0000 mg | ORAL_CAPSULE | Freq: Every day | ORAL | 3 refills | Status: DC

## 2021-10-07 ENCOUNTER — Encounter: Payer: Self-pay | Admitting: Cardiology

## 2021-10-07 DIAGNOSIS — Z79899 Other long term (current) drug therapy: Secondary | ICD-10-CM

## 2021-10-07 DIAGNOSIS — I10 Essential (primary) hypertension: Secondary | ICD-10-CM

## 2021-10-07 MED ORDER — HYDROCHLOROTHIAZIDE 25 MG PO TABS: 25.0000 mg | ORAL_TABLET | Freq: Every day | ORAL | 3 refills | Status: DC

## 2021-10-07 NOTE — Telephone Encounter (Signed)
Lewayne Bunting, MD  You 6 minutes ago (10:15 AM)    Would continue Cardizem at 360 mg daily and follow blood pressure.  We can reduce the dose if his blood pressure decreases.  Olga Millers

## 2021-10-07 NOTE — Telephone Encounter (Signed)
Resume HCTZ 25 mg daily and follow blood pressure.  Check potassium and renal function in 1 week.  Olga Millers

## 2021-10-21 DIAGNOSIS — Z79899 Other long term (current) drug therapy: Secondary | ICD-10-CM | POA: Diagnosis not present

## 2021-10-21 DIAGNOSIS — I10 Essential (primary) hypertension: Secondary | ICD-10-CM | POA: Diagnosis not present

## 2021-10-22 ENCOUNTER — Encounter: Payer: Self-pay | Admitting: Cardiology

## 2021-10-22 LAB — BASIC METABOLIC PANEL
BUN/Creatinine Ratio: 18 (ref 9–20)
BUN: 18 mg/dL (ref 6–24)
CO2: 27 mmol/L (ref 20–29)
Calcium: 9.7 mg/dL (ref 8.7–10.2)
Chloride: 101 mmol/L (ref 96–106)
Creatinine, Ser: 0.99 mg/dL (ref 0.76–1.27)
Glucose: 96 mg/dL (ref 70–99)
Potassium: 3.6 mmol/L (ref 3.5–5.2)
Sodium: 143 mmol/L (ref 134–144)
eGFR: 88 mL/min/{1.73_m2} (ref >59)

## 2022-01-05 ENCOUNTER — Other Ambulatory Visit: Payer: Self-pay | Admitting: Cardiology

## 2022-02-05 DIAGNOSIS — S233XXA Sprain of ligaments of thoracic spine, initial encounter: Secondary | ICD-10-CM | POA: Diagnosis not present

## 2022-02-05 DIAGNOSIS — S134XXA Sprain of ligaments of cervical spine, initial encounter: Secondary | ICD-10-CM | POA: Diagnosis not present

## 2022-02-05 DIAGNOSIS — S338XXA Sprain of other parts of lumbar spine and pelvis, initial encounter: Secondary | ICD-10-CM | POA: Diagnosis not present

## 2022-02-25 DIAGNOSIS — S46911D Strain of unspecified muscle, fascia and tendon at shoulder and upper arm level, right arm, subsequent encounter: Secondary | ICD-10-CM | POA: Diagnosis not present

## 2022-05-07 DIAGNOSIS — I639 Cerebral infarction, unspecified: Secondary | ICD-10-CM | POA: Diagnosis not present

## 2022-05-07 DIAGNOSIS — R109 Unspecified abdominal pain: Secondary | ICD-10-CM | POA: Diagnosis not present

## 2022-06-17 DIAGNOSIS — R109 Unspecified abdominal pain: Secondary | ICD-10-CM | POA: Diagnosis not present

## 2022-06-17 DIAGNOSIS — J0110 Acute frontal sinusitis, unspecified: Secondary | ICD-10-CM | POA: Diagnosis not present

## 2022-06-17 DIAGNOSIS — I639 Cerebral infarction, unspecified: Secondary | ICD-10-CM | POA: Diagnosis not present

## 2022-06-27 ENCOUNTER — Encounter: Payer: Self-pay | Admitting: Cardiology

## 2022-06-27 DIAGNOSIS — I480 Paroxysmal atrial fibrillation: Secondary | ICD-10-CM

## 2022-06-27 DIAGNOSIS — I739 Peripheral vascular disease, unspecified: Secondary | ICD-10-CM

## 2022-06-27 MED ORDER — RIVAROXABAN 20 MG PO TABS: 20.0000 mg | ORAL_TABLET | Freq: Every day | ORAL | 0 refills | Status: DC

## 2022-06-27 MED ORDER — ATORVASTATIN CALCIUM 10 MG PO TABS: 10.0000 mg | ORAL_TABLET | Freq: Every day | ORAL | 0 refills | Status: DC

## 2022-07-11 ENCOUNTER — Ambulatory Visit: Payer: BC Managed Care – PPO | Admitting: Internal Medicine

## 2022-07-14 ENCOUNTER — Encounter: Payer: Self-pay | Admitting: Internal Medicine

## 2022-07-23 NOTE — Progress Notes (Signed)
HPI: FU atrial fibrillation. Patient has a history of paroxysmal atrial fibrillation, CVA and is on chronic Xarelto. Cath 2005 showed no CAD.  ABIs with Doppler November 2019 showed no significant arterial occlusive disease. Carotid Dopplers 11/21 showed 1 to 39% bilateral stenosis. Echocardiogram December 2021 showed normal LV function.  Calcium score December 2021 0; aortic atherosclerosis noted.  Since last seen he has some dyspnea on exertion but no orthopnea, PND, pedal edema, chest pain or syncope.  Current Outpatient Medications  Medication Sig Dispense Refill   acetaminophen (TYLENOL) 500 MG tablet Take 500 mg by mouth every 6 (six) hours as needed.     albuterol (VENTOLIN HFA) 108 (90 Base) MCG/ACT inhaler INHALE 2 PUFFS BY MOUTH EVERY 6 HOURS AS NEEDED FOR WHEEZING 9 g 2   atorvastatin (LIPITOR) 10 MG tablet Take 1 tablet (10 mg total) by mouth daily. 90 tablet 0   Bioflavonoid Products (SUPER-C 1000 PO) Take by mouth.     Cholecalciferol (D3-1000) 25 MCG (1000 UT) tablet Take 1,000 Units by mouth daily.     diltiazem (CARDIZEM CD) 360 MG 24 hr capsule Take 1 capsule (360 mg total) by mouth daily. 90 capsule 3   ECHINACEA-ZINC-VITAMIN C PO Take 1 tablet by mouth in the morning.     hydrochlorothiazide (HYDRODIURIL) 25 MG tablet Take 1 tablet (25 mg total) by mouth daily. 90 tablet 3   rivaroxaban (XARELTO) 20 MG TABS tablet Take 1 tablet (20 mg total) by mouth daily. 90 tablet 0   No current facility-administered medications for this visit.     Past Medical History:  Diagnosis Date   Allergy    Atrial fibrillation (HCC)    GERD (gastroesophageal reflux disease)    Hyperlipidemia    Hypertension    IBS (irritable bowel syndrome)    Migraines    Neuromuscular disorder (HCC)    Stroke (HCC)    2014 or 2016    Past Surgical History:  Procedure Laterality Date   CARDIAC CATHETERIZATION     age 78    Social History   Socioeconomic History   Marital status: Married     Spouse name: [name]   Number of children: 3   Years of education: 12   Highest education level: Not on file  Occupational History   Occupation: Truck driver  Tobacco Use   Smoking status: Every Day    Packs/day: 1    Types: Cigarettes   Smokeless tobacco: Never  Vaping Use   Vaping Use: Never used  Substance and Sexual Activity   Alcohol use: No    Alcohol/week: 0.0 standard drinks of alcohol    Comment: none in 12 years   Drug use: No   Sexual activity: Yes  Other Topics Concern   Not on file  Social History Narrative   Lives at home with wife.   Right Handed   Caffeine use: Drink coke (5-6 glasses per day)   Social Determinants of Health   Financial Resource Strain: Not on file  Food Insecurity: Not on file  Transportation Needs: Not on file  Physical Activity: Not on file  Stress: Not on file  Social Connections: Not on file  Intimate Partner Violence: Not on file    Family History  Problem Relation Age of Onset   CVA Father    Colon cancer Neg Hx    Colon polyps Neg Hx     ROS: no fevers or chills, productive cough, hemoptysis, dysphasia, odynophagia, melena, hematochezia,  dysuria, hematuria, rash, seizure activity, orthopnea, PND, pedal edema, claudication. Remaining systems are negative.  Physical Exam: Well-developed well-nourished in no acute distress.  Skin is warm and dry.  HEENT is normal.  Neck is supple.  Chest is clear to auscultation with normal expansion.  Cardiovascular exam is regular rate and rhythm.  Abdominal exam nontender or distended. No masses palpated. Extremities show no edema. neuro grossly intact  ECG-normal sinus rhythm at a rate of 60, right bundle branch block, cannot rule out septal infarct.  Personally reviewed  A/P  1 paroxysmal atrial fibrillation-patient remains in sinus rhythm.  Continue Cardizem and Xarelto.  If he has more frequent episodes in the future we will consider referral for antiarrhythmic versus ablation.   Hemoglobin and renal function monitored by primary care.  2 hypertension-blood pressure is borderline.  I discussed adding an additional agent for better control and he will consider.  Would likely begin losartan 50 mg daily.  3 hyperlipidemia-continue statin.  Lipids and liver monitored by primary care.  4 tobacco abuse-we discussed the importance of avoiding.  Olga Millers, MD

## 2022-08-01 ENCOUNTER — Ambulatory Visit: Payer: BC Managed Care – PPO | Attending: Cardiology | Admitting: Cardiology

## 2022-08-01 ENCOUNTER — Encounter: Payer: Self-pay | Admitting: Cardiology

## 2022-08-01 VITALS — BP 138/84 | HR 69 | Ht 68.0 in | Wt 164.6 lb

## 2022-08-01 DIAGNOSIS — I739 Peripheral vascular disease, unspecified: Secondary | ICD-10-CM

## 2022-08-01 DIAGNOSIS — E7800 Pure hypercholesterolemia, unspecified: Secondary | ICD-10-CM | POA: Diagnosis not present

## 2022-08-01 DIAGNOSIS — Z8673 Personal history of transient ischemic attack (TIA), and cerebral infarction without residual deficits: Secondary | ICD-10-CM

## 2022-08-01 DIAGNOSIS — I480 Paroxysmal atrial fibrillation: Secondary | ICD-10-CM

## 2022-08-01 DIAGNOSIS — I10 Essential (primary) hypertension: Secondary | ICD-10-CM | POA: Diagnosis not present

## 2022-08-01 MED ORDER — HYDROCHLOROTHIAZIDE 25 MG PO TABS: 25.0000 mg | ORAL_TABLET | Freq: Every day | ORAL | 3 refills | Status: AC

## 2022-08-01 MED ORDER — DILTIAZEM HCL ER COATED BEADS 360 MG PO CP24: 360.0000 mg | ORAL_CAPSULE | Freq: Every day | ORAL | 3 refills | Status: DC

## 2022-08-01 MED ORDER — RIVAROXABAN 20 MG PO TABS: 20.0000 mg | ORAL_TABLET | Freq: Every day | ORAL | 3 refills | Status: AC

## 2022-08-01 MED ORDER — ATORVASTATIN CALCIUM 10 MG PO TABS: 10.0000 mg | ORAL_TABLET | Freq: Every day | ORAL | 3 refills | Status: AC

## 2022-08-01 NOTE — Patient Instructions (Signed)
    Follow-Up: At [facility], you and your health needs are our priority.  As part of our continuing mission to provide you with exceptional heart care, we have created designated Provider Care Teams.  These Care Teams include your primary Cardiologist (physician) and Advanced Practice Providers (APPs -  Physician Assistants and Nurse Practitioners) who all work together to provide you with the care you need, when you need it.  We recommend signing up for the patient portal called "MyChart".  Sign up information is provided on this After Visit Summary.  MyChart is used to connect with patients for Virtual Visits (Telemedicine).  Patients are able to view lab/test results, encounter notes, upcoming appointments, etc.  Non-urgent messages can be sent to your provider as well.   To learn more about what you can do with MyChart, go to [email or web address].    Your next appointment:   12 month(s)  Provider:   Brian Crenshaw, MD     

## 2022-08-22 ENCOUNTER — Encounter: Payer: Self-pay | Admitting: Internal Medicine

## 2022-08-22 ENCOUNTER — Telehealth: Payer: Self-pay | Admitting: *Deleted

## 2022-08-22 ENCOUNTER — Ambulatory Visit: Payer: BC Managed Care – PPO | Admitting: Internal Medicine

## 2022-08-22 VITALS — BP 134/76 | HR 62 | Temp 97.4°F | Ht 68.0 in | Wt 167.2 lb

## 2022-08-22 DIAGNOSIS — R1011 Right upper quadrant pain: Secondary | ICD-10-CM

## 2022-08-22 DIAGNOSIS — K625 Hemorrhage of anus and rectum: Secondary | ICD-10-CM | POA: Diagnosis not present

## 2022-08-22 NOTE — Telephone Encounter (Signed)
CBC from [facility] from 05/07/22: Platelets 200, hbg 18.1, hct 53.7

## 2022-08-22 NOTE — Progress Notes (Unsigned)
Primary Care Physician:  Rebekah Chesterfield, NP Primary Gastroenterologist:  Dr. Jena Gauss  Pre-Procedure History & Physical: HPI:  Frank Silva is a 57 y.o. male here for further evaluation of chronic intermittent rectal bleeding, alternating constipation punctuated by normal bowel function.  We saw him in 2021 and recommended a colonoscopy.  Because of his atrial fibrillation and history of CVA coming off of Xarelto he was hesitant and has put it off until now.  Also, has right upper quadrant abdominal pain on a regular basis.  Ultrasound previously negative.  No odynophagia or dysphagia denies reflux symptoms.  EGD recommended previously but patient put that procedure off as well.  Continues to smoke.  He has big fear is recurrent CVA off anticoagulation.  Was told by his cardiologist of the risk would be low coming off Xarelto for the procedure.  Past Medical History:  Diagnosis Date   Allergy    Atrial fibrillation (HCC)    GERD (gastroesophageal reflux disease)    Hyperlipidemia    Hypertension    IBS (irritable bowel syndrome)    Migraines    Neuromuscular disorder (HCC)    Stroke (HCC)    2014 or 2016    Past Surgical History:  Procedure Laterality Date   CARDIAC CATHETERIZATION     age 13    Prior to Admission medications   Medication Sig Start Date End Date Taking? Authorizing Provider  albuterol (VENTOLIN HFA) 108 (90 Base) MCG/ACT inhaler INHALE 2 PUFFS BY MOUTH EVERY 6 HOURS AS NEEDED FOR WHEEZING 01/06/22  Yes Crenshaw, Madolyn Frieze, MD  atorvastatin (LIPITOR) 10 MG tablet Take 1 tablet (10 mg total) by mouth daily. 08/01/22  Yes Lewayne Bunting, MD  diltiazem (CARDIZEM CD) 360 MG 24 hr capsule Take 1 capsule (360 mg total) by mouth daily. 08/01/22  Yes Lewayne Bunting, MD  hydrochlorothiazide (HYDRODIURIL) 25 MG tablet Take 1 tablet (25 mg total) by mouth daily. 08/01/22  Yes Lewayne Bunting, MD  loratadine (CLARITIN) 10 MG tablet Take 10 mg by mouth daily.   Yes  [provider]  rivaroxaban (XARELTO) 20 MG TABS tablet Take 1 tablet (20 mg total) by mouth daily. 08/01/22  Yes Lewayne Bunting, MD    Allergies as of 08/22/2022 - Review Complete 08/22/2022  Allergen Reaction Noted   Flexeril [cyclobenzaprine] Other (See Comments) 08/05/2011   Peanut-containing drug products Hives and Shortness Of Breath 04/23/2012   Benadryl [diphenhydramine]  10/19/2019   Grapefruit bioflavonoid complex Other (See Comments) 02/17/2017   Other Other (See Comments) 02/17/2017   Prednisone Other (See Comments) 05/26/2016    Family History  Problem Relation Age of Onset   CVA Father    Colon cancer Neg Hx    Colon polyps Neg Hx     Social History   Socioeconomic History   Marital status: Married    Spouse name: [name]   Number of children: 3   Years of education: 12   Highest education level: Not on file  Occupational History   Occupation: Truck driver  Tobacco Use   Smoking status: Every Day    Packs/day: 1    Types: Cigarettes   Smokeless tobacco: Never  Vaping Use   Vaping Use: Never used  Substance and Sexual Activity   Alcohol use: No    Alcohol/week: 0.0 standard drinks of alcohol    Comment: none in 12 years   Drug use: No   Sexual activity: Yes  Other Topics Concern  Not on file  Social History Narrative   Lives at home with wife.   Right Handed   Caffeine use: Drink coke (5-6 glasses per day)   Social Determinants of Health   Financial Resource Strain: Not on file  Food Insecurity: Not on file  Transportation Needs: Not on file  Physical Activity: Not on file  Stress: Not on file  Social Connections: Not on file  Intimate Partner Violence: Not on file    Review of Systems: See HPI, otherwise negative ROS  Physical Exam: BP 134/76 (BP Location: Right Arm, Patient Position: Sitting, Cuff Size: Normal)   Pulse 62   Temp (!) 97.4 F (36.3 C) (Oral)   Ht 5\' 8"  (1.727 m)   Wt 167 lb 3.2 oz (75.8 kg)   SpO2 96%    BMI 25.42 kg/m  General:   Alert,  Well-developed, well-nourished, pleasant and cooperative in NAD Neck:  Supple; no masses or thyromegaly. No significant cervical adenopathy. Lungs:  Clear throughout to auscultation.   No wheezes, crackles, or rhonchi. No acute distress. Heart:  Regular rate and rhythm; no murmurs, clicks, rubs,  or gallops. Abdomen: Non-distended, normal bowel sounds.  Soft and nontender without appreciable mass or hepatosplenomegaly.  Pulses:  Normal pulses noted. Extremities:  Without clubbing or edema.  Impression/Plan: 57 year old gentleman with intermittent rectal bleeding -  has been chronic now for at least 3 to 4 years.  He declined colonoscopy previously.  Also, intermittent right upper quadrant abdominal pain for which EGD was recommended.  He is quite fearful of interrupting anticoagulation for anything.  We discussed the need for EGD and colonoscopy.  Probably the best approach to evaluate his symptoms.  Option would be to continue Xarelto for the EGD and colonoscopy understanding that if a significant lesion needed to be biopsied or removed he may need to be rescheduled for second procedure.  Alternatively bridging Xarelto with Lovenox would minimize the window off anticoagulation is likely the safest approach.  After lengthy discussion, he is agreed with that approach  Recommendations:  Offer the patient both an EGD and a colonoscopy for diagnostic purpose.  Right upper quadrant abdominal pain and rectal bleeding.  ASA 3.  We will plan to hold Xarelto and bridge with Lovenox.  Will reach out to cardiology for regarding timing/dosing recommendations.  Further recommendations to follow.     Notice: This dictation was prepared with Dragon dictation along with smaller phrase technology. Any transcriptional errors that result from this process are unintentional and may not be corrected upon review.

## 2022-08-22 NOTE — Telephone Encounter (Signed)
  What type of surgery is being performed? Colonoscopy/EGD  When is surgery scheduled? TBD  What type of clearance is required (medical or pharmacy to hold medication or both? Medication  Are there any medications that need to be held prior to surgery and how long? Timing to hold Xarelto and bridge for lovenox  Name of physician performing surgery?  Dr. Jaci Lazier [facility] Phone: [phone] Fax: [phone]  Anethesia type (none, local, MAC, general)? MAC

## 2022-08-22 NOTE — Telephone Encounter (Signed)
Patient with diagnosis of atrial fibrillation on Xarelto for anticoagulation.    What type of surgery is being performed? Colonoscopy/EGD   When is surgery scheduled? TBD   CHA2DS2-VASc Score = 4   This indicates a 4.8% annual risk of stroke. The patient's score is based upon: CHF History: 0 HTN History: 1 Diabetes History: 0 Stroke History: 2 Vascular Disease History: 1 Age Score: 0 Gender Score: 0   Embolic CVA 2016   CrCl 86 Platelet count last drawn > 1 year ago  Please have CBC drawn for final clearance  **This guidance is not considered finalized until pre-operative APP has relayed final recommendations.**

## 2022-08-22 NOTE — Telephone Encounter (Signed)
CBC is stable. Recommend holding Xarelto for 1 day prior to procedure. Short DOAC hold negates need for Lovenox bridge.

## 2022-08-22 NOTE — Patient Instructions (Signed)
It was nice to meet you today!  As discussed you need to have an evaluation of your stomach and colon.  We talked about the various options of doing the procedures without interrupting Xarelto treatment (this approach could lead to subsequent procedures if a significant lesion is found).  Alternatively, we could "bridge" your anticoagulation with Lovenox to minimize the time you are without it. The latter approach is likely best as discussed.  Will reach out to cardiology to get their recommendations on the timing of stopping Xarelto and dosing of Lovenox so as to minimize the time you are off anticoagulation so the procedure can be safely performed.  ASA 3.  Further recommendations to follow.

## 2022-08-25 NOTE — Telephone Encounter (Signed)
   Patient Name: Frank Silva  DOB: 1966/02/02 MRN: [id number]  Primary Cardiologist: Olga Millers, MD  Clinical pharmacists have reviewed the patient's past medical history, labs, and current medications as part of preoperative protocol coverage. The following recommendations have been made:  Patient with diagnosis of atrial fibrillation on Xarelto for anticoagulation.     What type of surgery is being performed? Colonoscopy/EGD   When is surgery scheduled? TBD     CHA2DS2-VASc Score = 4   This indicates a 4.8% annual risk of stroke. The patient's score is based upon: CHF History: 0 HTN History: 1 Diabetes History: 0 Stroke History: 2 Vascular Disease History: 1 Age Score: 0 Gender Score: 0   Embolic CVA 2016    CrCl 86 Platelet count l200 Hgb 18.1 Hct 53.7  Per office protocol, patient may hold Xarelto for 1 day prior to procedure.  Lovenox bridging is not required for this procedure. Please resume Xarelto as soon as possible postprocedure, at the discretion of the surgeon.   I will route this recommendation to the requesting party via Epic fax function and remove from pre-op pool.  Please call with questions.  Joylene Grapes, NP 08/25/2022, 11:14 AM

## 2022-08-26 NOTE — Telephone Encounter (Signed)
Dr. Jena Gauss, please review recommendations below "Per office protocol, patient may hold Xarelto for 1 day prior to procedure.  Lovenox bridging is not required for this procedure. Please resume Xarelto as soon as possible postprocedure, at the discretion of the surgeon."   Thanks!

## 2022-08-27 ENCOUNTER — Other Ambulatory Visit: Payer: Self-pay | Admitting: Cardiology

## 2022-08-27 NOTE — Telephone Encounter (Signed)
Pt is aware. Also aware will call back once we have August schedule available to schedule

## 2022-08-27 NOTE — Telephone Encounter (Signed)
Would need to get from medical doctor

## 2022-08-27 NOTE — Telephone Encounter (Signed)
Pt's pharmacy is requesting a refill on albuterol inhaler. Would Dr. [name] like to refill this medication? Please address

## 2022-09-23 MED ORDER — NA SULFATE-K SULFATE-MG SULF 17.5-3.13-1.6 GM/177ML PO SOLN: ORAL | 0 refills | Status: AC

## 2022-09-23 NOTE — Telephone Encounter (Signed)
Pt called back. He has been scheduled for 8/28. Aware will send instructions/pre-op appt via mychart. Rx for prep will be sent to pharmacy. Aware okay to hold xarelto x 1 day only.

## 2022-09-23 NOTE — Addendum Note (Signed)
Addended by: Armstead Peaks on: 09/23/2022 10:54 AM   Modules accepted: Orders

## 2022-09-23 NOTE — Telephone Encounter (Signed)
Called pt to schedule and he stated he will have to check his calendar and call back. He is aware the Wednesday available for procedures.

## 2022-09-24 ENCOUNTER — Encounter: Payer: Self-pay | Admitting: *Deleted

## 2022-10-28 ENCOUNTER — Telehealth: Payer: Self-pay | Admitting: *Deleted

## 2022-10-28 NOTE — Telephone Encounter (Signed)
Pt called in and stated he needed to cancel procedure for next week. Stated would call to reschedule when he can

## 2022-11-03 ENCOUNTER — Encounter (HOSPITAL_COMMUNITY): Payer: BC Managed Care – PPO

## 2022-11-05 ENCOUNTER — Ambulatory Visit (HOSPITAL_COMMUNITY)
Admission: RE | Admit: 2022-11-05 | Payer: BC Managed Care – PPO | Source: Home / Self Care | Admitting: Internal Medicine

## 2022-11-05 ENCOUNTER — Other Ambulatory Visit: Payer: Self-pay | Admitting: Cardiology

## 2022-11-05 ENCOUNTER — Encounter (HOSPITAL_COMMUNITY): Admission: RE | Payer: Self-pay | Source: Home / Self Care

## 2022-11-05 DIAGNOSIS — I480 Paroxysmal atrial fibrillation: Secondary | ICD-10-CM

## 2022-11-05 DIAGNOSIS — Z8673 Personal history of transient ischemic attack (TIA), and cerebral infarction without residual deficits: Secondary | ICD-10-CM

## 2022-11-05 SURGERY — COLONOSCOPY WITH PROPOFOL
Anesthesia: Monitor Anesthesia Care

## 2022-11-28 ENCOUNTER — Other Ambulatory Visit: Payer: Self-pay | Admitting: Cardiology

## 2022-11-28 NOTE — Telephone Encounter (Signed)
Need to get from pcp

## 2023-07-14 ENCOUNTER — Encounter: Payer: Self-pay | Admitting: Cardiology

## 2023-07-14 NOTE — Telephone Encounter (Signed)
 Please see documentation in alternate MyChart message "Blood Pressure Question"

## 2023-08-06 LAB — PSA: PSA, Total: 0.5

## 2023-08-06 LAB — COMPREHENSIVE METABOLIC PANEL WITH GFR: EGFR (Non-African Amer.): 86

## 2023-09-11 ENCOUNTER — Other Ambulatory Visit: Payer: Self-pay | Admitting: Cardiology

## 2023-09-11 DIAGNOSIS — I480 Paroxysmal atrial fibrillation: Secondary | ICD-10-CM

## 2023-09-11 DIAGNOSIS — Z8673 Personal history of transient ischemic attack (TIA), and cerebral infarction without residual deficits: Secondary | ICD-10-CM

## 2023-09-14 NOTE — Progress Notes (Signed)
 HPI: FU atrial fibrillation. Patient has a history of paroxysmal atrial fibrillation, CVA and is on chronic Xarelto . Cath 2005 showed no CAD.  ABIs with Doppler November 2019 showed no significant arterial occlusive disease. Carotid Dopplers 11/21 showed 1 to 39% bilateral stenosis. Echocardiogram December 2021 showed normal LV function.  Calcium  score December 2021 0; aortic atherosclerosis noted.  Since last seen the patient denies any dyspnea on exertion, orthopnea, PND, pedal edema, palpitations, syncope or chest pain.   Current Outpatient Medications  Medication Sig Dispense Refill   albuterol  (VENTOLIN  HFA) 108 (90 Base) MCG/ACT inhaler INHALE 2 PUFFS BY MOUTH EVERY 6 HOURS AS NEEDED FOR WHEEZING (Patient taking differently: as needed. INHALE 2 PUFFS BY MOUTH EVERY 6 HOURS AS NEEDED FOR WHEEZING) 9 g 2   atorvastatin  (LIPITOR) 10 MG tablet Take 1 tablet (10 mg total) by mouth daily. 90 tablet 3   diltiazem  (CARDIZEM  CD) 360 MG 24 hr capsule Take 1 capsule by mouth once daily 90 capsule 0   hydrochlorothiazide  (HYDRODIURIL ) 25 MG tablet Take 1 tablet (25 mg total) by mouth daily. 90 tablet 3   loratadine (CLARITIN) 10 MG tablet Take 10 mg by mouth daily.     rivaroxaban  (XARELTO ) 20 MG TABS tablet Take 1 tablet (20 mg total) by mouth daily. 90 tablet 3   Na Sulfate-K Sulfate-Mg Sulf 17.5-3.13-1.6 GM/177ML SOLN As directed 354 mL 0   No current facility-administered medications for this visit.     Past Medical History:  Diagnosis Date   Allergy    Atrial fibrillation (HCC)    GERD (gastroesophageal reflux disease)    Hyperlipidemia    Hypertension    IBS (irritable bowel syndrome)    Migraines    Neuromuscular disorder (HCC)    Stroke (HCC)    2014 or 2016    Past Surgical History:  Procedure Laterality Date   CARDIAC CATHETERIZATION     age 58    Social History   Socioeconomic History   Marital status: Married    Spouse name: Olam   Number of children: 3    Years of education: 12   Highest education level: Not on file  Occupational History   Occupation: Truck driver  Tobacco Use   Smoking status: Every Day    Current packs/day: 1.00    Types: Cigarettes   Smokeless tobacco: Never  Vaping Use   Vaping status: Never Used  Substance and Sexual Activity   Alcohol use: No    Alcohol/week: 0.0 standard drinks of alcohol    Comment: none in 12 years   Drug use: No   Sexual activity: Yes  Other Topics Concern   Not on file  Social History Narrative   Lives at home with wife.   Right Handed   Caffeine use: Drink coke (5-6 glasses per day)   Social Drivers of Health   Financial Resource Strain: Low Risk  (06/03/2022)   Received from [facility]   Overall Financial Resource Strain (CARDIA)    Difficulty of Paying Living Expenses: Not very hard  Food Insecurity: Patient Declined (06/03/2022)   Received from [facility]   Hunger Vital Sign    Within the past 12 months, you worried that your food would run out before you got the money to buy more.: Patient declined    Within the past 12 months, the food you bought just didn't last and you didn't have money to get more.: Patient declined  Transportation Needs: No Transportation Needs (  06/03/2022)   Received from [facility] - Transportation    Lack of Transportation (Medical): No    Lack of Transportation (Non-Medical): No  Physical Activity: Insufficiently Active (06/03/2022)   Received from [facility]   Exercise Vital Sign    On average, how many days per week do you engage in moderate to strenuous exercise (like a brisk walk)?: 1 day    On average, how many minutes do you engage in exercise at this level?: 30 min  Stress: Patient Declined (06/03/2022)   Received from [facility] - Occupational Stress Questionnaire    Feeling of Stress : Patient declined  Social Connections: Moderately Integrated (06/03/2022)   Received from  [facility]   Social Network    How would you rate your social network (family, work, friends)?: Adequate participation with social networks  Intimate Partner Violence: Not At Risk (06/03/2022)   Received from [facility]   HITS    Over the last 12 months how often did your partner physically hurt you?: Never    Over the last 12 months how often did your partner insult you or talk down to you?: Never    Over the last 12 months how often did your partner threaten you with physical harm?: Never    Over the last 12 months how often did your partner scream or curse at you?: Never    Family History  Problem Relation Age of Onset   CVA Father    Colon cancer Neg Hx    Colon polyps Neg Hx     ROS: no fevers or chills, productive cough, hemoptysis, dysphasia, odynophagia, melena, hematochezia, dysuria, hematuria, rash, seizure activity, orthopnea, PND, pedal edema, claudication. Remaining systems are negative.  Physical Exam: Well-developed well-nourished in no acute distress.  Skin is warm and dry.  HEENT is normal.  Neck is supple.  Chest is clear to auscultation with normal expansion.  Cardiovascular exam is regular rate and rhythm.  Abdominal exam nontender or distended. No masses palpated. Extremities show no edema. neuro grossly intact  EKG Interpretation Date/Time:  Monday September 28 2023 09:28:26 EDT Ventricular Rate:  57 PR Interval:  178 QRS Duration:  112 QT Interval:  440 QTC Calculation: 428 R Axis:   -80  Text Interpretation: Sinus bradycardia Left axis deviation Confirmed by Pietro Rogue ([id number]) on 09/28/2023 9:31:56 AM    A/P  1 paroxysmal atrial fibrillation-patient remains in sinus rhythm today.  Continue Cardizem  for rate control of atrial fibrillation recurs.  Continue Xarelto .  Recent hemoglobin May 2025 15.8 and creatinine 1.0.  2 hyperlipidemia-continue statin.  Recent laboratories May 2025 reviewed and showed total cholesterol 138, LDL 63 and normal  liver functions.  3 hypertension-patient's blood pressure is controlled.  Continue present medical regimen.  4 tobacco abuse-patient recently discontinued.  5 carotid artery disease-mild on previous Dopplers.  Will repeat as it has been 4 years.  Rogue Pietro, MD

## 2023-09-28 ENCOUNTER — Encounter: Payer: Self-pay | Admitting: Cardiology

## 2023-09-28 ENCOUNTER — Ambulatory Visit: Attending: Cardiology | Admitting: Cardiology

## 2023-09-28 VITALS — BP 140/80 | HR 57 | Ht 68.0 in | Wt 183.8 lb

## 2023-09-28 DIAGNOSIS — Z8673 Personal history of transient ischemic attack (TIA), and cerebral infarction without residual deficits: Secondary | ICD-10-CM | POA: Diagnosis not present

## 2023-09-28 DIAGNOSIS — E7800 Pure hypercholesterolemia, unspecified: Secondary | ICD-10-CM | POA: Diagnosis not present

## 2023-09-28 DIAGNOSIS — I480 Paroxysmal atrial fibrillation: Secondary | ICD-10-CM

## 2023-09-28 DIAGNOSIS — I10 Essential (primary) hypertension: Secondary | ICD-10-CM

## 2023-09-28 DIAGNOSIS — I6523 Occlusion and stenosis of bilateral carotid arteries: Secondary | ICD-10-CM

## 2023-09-28 NOTE — Patient Instructions (Signed)
   Testing/Procedures:  Your physician has requested that you have a carotid duplex. This test is an ultrasound of the carotid arteries in your neck. It looks at blood flow through these arteries that supply the brain with blood. Allow one hour for this exam. There are no restrictions or special instructions. [address]  Follow-Up: At [facility], you and your health needs are our priority.  As part of our continuing mission to provide you with exceptional heart care, our providers are all part of one team.  This team includes your primary Cardiologist (physician) and Advanced Practice Providers or APPs (Physician Assistants and Nurse Practitioners) who all work together to provide you with the care you need, when you need it.  Your next appointment:   12 month(s)  Provider:   Redell Shallow, MD

## 2023-09-29 ENCOUNTER — Telehealth: Payer: Self-pay

## 2023-09-29 ENCOUNTER — Encounter: Payer: Self-pay | Admitting: Gastroenterology

## 2023-09-29 ENCOUNTER — Ambulatory Visit: Admitting: Gastroenterology

## 2023-09-29 VITALS — BP 122/70 | HR 64 | Ht 68.0 in | Wt 183.8 lb

## 2023-09-29 DIAGNOSIS — Z1211 Encounter for screening for malignant neoplasm of colon: Secondary | ICD-10-CM

## 2023-09-29 DIAGNOSIS — Z7901 Long term (current) use of anticoagulants: Secondary | ICD-10-CM | POA: Diagnosis not present

## 2023-09-29 DIAGNOSIS — I4891 Unspecified atrial fibrillation: Secondary | ICD-10-CM

## 2023-09-29 DIAGNOSIS — R1011 Right upper quadrant pain: Secondary | ICD-10-CM

## 2023-09-29 DIAGNOSIS — K581 Irritable bowel syndrome with constipation: Secondary | ICD-10-CM | POA: Diagnosis not present

## 2023-09-29 NOTE — Addendum Note (Signed)
 Addended by: Patton Swisher E on: 09/29/2023 01:21 PM   Modules accepted: Orders

## 2023-09-29 NOTE — Progress Notes (Signed)
 Chief Complaint:   Referring Provider:  Renato Dorothey HERO, NP      ASSESSMENT AND PLAN;   #1. RUQ pain. Nl CBC, CMP  #2. IBS-C. Needs CRC screening. Rare rectal bleeding.  #3. A Fib/CVA on Xeralto  Plan: -US  abdo -EGD/Colon after clearence from Dr Pietro and holding Xeralto x 24hrs. -Metamucil 1tbs po every day with 8oz water.   I discussed EGD/Colonoscopy- the indications, risks, alternatives and potential complications including, but not limited to bleeding, infection, reaction to meds, damage to internal organs, cardiac and/or pulmonary problems, and perforation requiring surgery. The possibility that significant findings could be missed was explained. All ? were answered. Pt consents to proceed. HPI:    Frank Silva is a 58 y.o. male  With A-fib/CVA on Xarelto  (Nl EF), HTN, COPD quit smoking, HLD  History of Present Illness Frank Silva is a 58 year old male with atrial fibrillation who presents with right-sided abdominal and back pain.  He experiences right-sided abdominal and back pain, particularly in the rib area, describing it as 'cutting' under the ribs. He reports that the pain worsens when he bends over. He has a history of irritable bowel syndrome and constipation, which he believes may contribute to his symptoms. He has not undergone a colonoscopy before.  He has a history of hemorrhoids and experiences occasional bleeding when constipated, attributing it to either hemorrhoids or an anal fissure. He is on blood thinners due to atrial fibrillation and a significant stroke in 2016, complicating the management of these symptoms.  He quit smoking on July 07, 2023, and has since gained weight, increasing from 167 to 183 pounds, which he attributes to increased eating after quitting smoking. He has bone spurs in his left heel, which he reports has limited his ability to walk for exercise.  He has been on Cardizem  since age 71 for atrial fibrillation and  takes hydrochlorothiazide , which contributes to constipation. He is on Xarelto  for atrial fibrillation and stroke prevention, cautious about missing doses due to a previous stroke after missing a dose.  He reports a family history of irritable bowel syndrome, with his mother and some siblings affected. 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  He does not consume alcohol and has not done so for 15 years. He quit smoking recently and does not take NSAIDs. He drinks Coke and water, with Coke being his primary vice. No current heartburn or acid reflux, although he had acid reflux in the past.  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 He describes his work as physically demanding but enjoys the proximity to home.  Never had EGD/colonoscopy  Was seen by Dr. Shaaron and advised endoscopic procedures.  At that time patient was reluctant to get off Xarelto .  Seen by Dr. Pietro yesterday.    Results LABS AST: 19 U/L ALT: 24 U/L Albumin: 3.9 g/dL Hemoglobin: 83.1 g/dL Mean Corpuscular Volume (MCV): 100 fL White Blood Cell count (WBC): 7.2 x 10^3/L Total cholesterol: 138 mg/dL    Past Medical History:  Diagnosis Date   Allergy    Atrial fibrillation (HCC)    GERD (gastroesophageal reflux disease)    Hyperlipidemia    Hypertension    IBS (irritable bowel syndrome)    Migraines    Neuromuscular disorder (HCC)    Stroke (HCC)  2014 or 2016    Past Surgical History:  Procedure Laterality Date   CARDIAC CATHETERIZATION     age 1    Family History  Problem Relation Age of Onset   CVA Father    Colon cancer Neg Hx    Colon polyps Neg Hx     Social History   Tobacco Use   Smoking status: Every Day    Current packs/day: 1.00    Types: Cigarettes   Smokeless tobacco: Never  Vaping Use   Vaping status: Never Used  Substance Use Topics    Alcohol use: No    Alcohol/week: 0.0 standard drinks of alcohol    Comment: none in 12 years   Drug use: No    Current Outpatient Medications  Medication Sig Dispense Refill   albuterol  (VENTOLIN  HFA) 108 (90 Base) MCG/ACT inhaler INHALE 2 PUFFS BY MOUTH EVERY 6 HOURS AS NEEDED FOR WHEEZING (Patient taking differently: as needed. INHALE 2 PUFFS BY MOUTH EVERY 6 HOURS AS NEEDED FOR WHEEZING, pt taking as needed) 9 g 2   atorvastatin  (LIPITOR) 10 MG tablet Take 1 tablet (10 mg total) by mouth daily. 90 tablet 3   diltiazem  (CARDIZEM  CD) 360 MG 24 hr capsule Take 1 capsule by mouth once daily 90 capsule 0   hydrochlorothiazide  (HYDRODIURIL ) 25 MG tablet Take 1 tablet (25 mg total) by mouth daily. 90 tablet 3   loratadine (CLARITIN) 10 MG tablet Take 10 mg by mouth daily.     rivaroxaban  (XARELTO ) 20 MG TABS tablet Take 1 tablet (20 mg total) by mouth daily. 90 tablet 3   Na Sulfate-K Sulfate-Mg Sulf 17.5-3.13-1.6 GM/177ML SOLN As directed 354 mL 0   No current facility-administered medications for this visit.    Allergies  Allergen Reactions   Flexeril  [Cyclobenzaprine ] Other (See Comments)    Heart race   Peanut-Containing Drug Products Hives and Shortness Of Breath   Benadryl [Diphenhydramine]     jittery   Grapefruit Bioflavonoid Complex Other (See Comments)    Interaction with medication   Other Other (See Comments)    Paprika, med interaction can throw heat out of rhythm    Prednisone  Other (See Comments)    Tremors    Review of Systems:  Constitutional: Denies fever, chills, diaphoresis, appetite change and fatigue.  HEENT: neg Respiratory: Denies SOB, DOE, cough, chest tightness,  and wheezing.   Cardiovascular: Denies chest pain, palpitations and leg swelling.  Genitourinary: Denies dysuria, urgency, frequency, hematuria, flank pain and difficulty urinating.  Musculoskeletal: Denies myalgias, back pain, joint swelling, arthralgias and gait problem.  Skin: No rash.   Neurological: Denies dizziness, seizures, syncope, weakness, light-headedness, numbness and headaches.  Hematological: Denies adenopathy. Easy bruising, personal or family bleeding history  Psychiatric/Behavioral: No anxiety or depression     Physical Exam:    BP 122/70   Pulse 64   Ht 5' 8 (1.727 m)   Wt 183 lb 12.8 oz (83.4 kg)   BMI 27.95 kg/m  Wt Readings from Last 3 Encounters:  09/29/23 183 lb 12.8 oz (83.4 kg)  09/28/23 183 lb 12.8 oz (83.4 kg)  08/22/22 167 lb 3.2 oz (75.8 kg)   Constitutional:  Well-developed, in no acute distress. Psychiatric: Normal mood and affect. Behavior is normal. HEENT: Pupils normal.  Conjunctivae are normal. No scleral icterus. Cardiovascular: Normal rate, regular rhythm. No edema Pulmonary/chest: Effort normal and breath sounds normal. No wheezing, rales or rhonchi. Abdominal: Soft, nondistended. Nontender. Bowel sounds active throughout. There are no masses  palpable. No hepatomegaly. Rectal: Deferred.  Performed at the time of colonoscopy. Neurological: Alert and oriented to person place and time. Skin: Skin is warm and dry. No rashes noted.  Data Reviewed: I have personally reviewed following labs and imaging studies  CBC:    Latest Ref Rng & Units 06/06/2021    8:03 AM 12/28/2019   12:43 PM 11/19/2018    1:43 AM  CBC  WBC 3.4 - 10.8 x10E3/uL 7.7  9.3  9.8   Hemoglobin 13.0 - 17.7 g/dL 83.0  83.2  83.3   Hematocrit 37.5 - 51.0 % 50.2  47.6  47.7   Platelets 150 - 450 x10E3/uL 233  222  230     CMP:    Latest Ref Rng & Units 10/21/2021    3:21 PM 06/06/2021    8:03 AM 12/28/2019   12:43 PM  CMP  Glucose 70 - 99 mg/dL 96  96  892   BUN 6 - 24 mg/dL 18  14  15    Creatinine 0.76 - 1.27 mg/dL 9.00  8.86  8.94   Sodium 134 - 144 mmol/L 143  142  134   Potassium 3.5 - 5.2 mmol/L 3.6  3.8  3.1   Chloride 96 - 106 mmol/L 101  101  100   CO2 20 - 29 mmol/L 27  28  26    Calcium  8.7 - 10.2 mg/dL 9.7  9.5  9.0   Total Protein 6.0 -  8.5 g/dL  6.7    Total Bilirubin 0.0 - 1.2 mg/dL  0.4    Alkaline Phos 44 - 121 IU/L  70    AST 0 - 40 IU/L  18    ALT 0 - 44 IU/L  22         Anselm Bring, MD 09/29/2023, 11:26 AM  Cc: Renato Dorothey HERO, NP

## 2023-09-29 NOTE — Patient Instructions (Addendum)
 _______________________________________________________  If your blood pressure at your visit was 140/90 or greater, please contact your primary care physician to follow up on this.  _______________________________________________________  If you are age 60 or older, your body mass index should be between 23-30. Your Body mass index is 27.95 kg/m. If this is out of the aforementioned range listed, please consider follow up with your Primary Care Provider.  If you are age 3 or younger, your body mass index should be between 19-25. Your Body mass index is 27.95 kg/m. If this is out of the aformentioned range listed, please consider follow up with your Primary Care Provider.   ________________________________________________________  The  GI providers would like to encourage you to use MYCHART to communicate with providers for non-urgent requests or questions.  Due to long hold times on the telephone, sending your provider a message by [facility] may be a faster and more efficient way to get a response.  Please allow 48 business hours for a response.  Please remember that this is for non-urgent requests.  _______________________________________________________  [facility] is using a team-based approach to care.  Your team is made up of your doctor and two to three APPS. Our APPS (Nurse Practitioners and Physician Assistants) work with your physician to ensure care continuity for you. They are fully qualified to address your health concerns and develop a treatment plan. They communicate directly with your gastroenterologist to care for you. Seeing the Advanced Practice Practitioners on your physician's team can help you by facilitating care more promptly, often allowing for earlier appointments, access to diagnostic testing, procedures, and other specialty referrals.     Ultrasound at [facility]  Address: [address] [place], [place], [place] [address]  Phone: [phone]  Nothing to eat or drink after midnight. Please call more than 24 hours in advance to reschedule if need to to avoid a cancellation fee. Date of ultrasound is 10-22-23 at 8am but be there at 740am to check in  You will be contacted by our office prior to your procedure for directions on holding your blood thinner.  If you do not hear from our office 2 week prior to your scheduled procedure, please call [phone] to discuss.   You have been scheduled for an endoscopy and colonoscopy. Please follow the written instructions given to you at your visit today.  If you use inhalers (even only as needed), please bring them with you on the day of your procedure.  DO NOT TAKE 7 DAYS PRIOR TO TEST- Trulicity (dulaglutide) Ozempic, Wegovy (semaglutide) Mounjaro (tirzepatide) Bydureon Bcise (exanatide extended release)  DO NOT TAKE 1 DAY PRIOR TO YOUR TEST Rybelsus (semaglutide) Adlyxin (lixisenatide) Victoza (liraglutide) Byetta (exanatide) ___________________________________________________________________________  Thank you,  Dr. Lynnie Bring

## 2023-09-29 NOTE — Telephone Encounter (Signed)
 LVM and my chart message

## 2023-09-29 NOTE — Telephone Encounter (Signed)
 Left detailed VM.

## 2023-09-29 NOTE — Telephone Encounter (Signed)
 [facility] Pre-operative Risk Assessment     Request for surgical clearance:     Endoscopy Procedure  What type of surgery is being performed?     EGD/Colon  When is this surgery scheduled?     12-02-23  What type of clearance is required ?   Pharmacy  Are there any medications that need to be held prior to surgery and how long? Xarelto  1 day prior  Practice name and name of physician performing surgery?       Gastroenterology  What is your office phone and fax number?      Phone- [phone]  Fax- [phone]  Anesthesia type (None, local, MAC, general) ?       MAC   Please route your response to [employer], [facility])

## 2023-10-01 ENCOUNTER — Encounter: Payer: Self-pay | Admitting: Internal Medicine

## 2023-10-02 NOTE — Telephone Encounter (Signed)
   Patient Name: Frank Silva  DOB: 03-10-1966 MRN: [id number]  Primary Cardiologist: Redell Shallow, MD  Chart reviewed as part of pre-operative protocol coverage. Given past medical history and time since last visit, based on ACC/AHA guidelines, Frank Silva is at acceptable risk for the planned procedure without further cardiovascular testing.   Per office protocol, patient can hold Xarelto  for 1 days prior to procedure.   Patient will not need bridging with Lovenox  (enoxaparin ) around procedure.  The patient was advised that if he develops new symptoms prior to surgery to contact our office to arrange for a follow-up visit, and he verbalized understanding.  I will route this recommendation to the requesting party via Epic fax function and remove from pre-op pool.  Please call with questions.  Lamarr Satterfield, NP 10/02/2023, 3:15 PM

## 2023-10-02 NOTE — Telephone Encounter (Signed)
 Patient with diagnosis of atrial fibrillation on Xarelto  for anticoagulation.    What type of surgery is being performed?     EGD/Colon  When is this surgery scheduled?     12-02-23    CHA2DS2-VASc Score = 4   This indicates a 4.8% annual risk of stroke. The patient's score is based upon: CHF History: 0 HTN History: 1 Diabetes History: 0 Stroke History: 2 Vascular Disease History: 1 Age Score: 0 Gender Score: 0    CrCl 93 Platelet count 213  Patient has not had an Afib/aflutter ablation within the last 3 months or DCCV within the last 30 days  Per office protocol, patient can hold Xarelto  for 1 days prior to procedure.   Patient will not need bridging with Lovenox  (enoxaparin ) around procedure.  **This guidance is not considered finalized until pre-operative APP has relayed final recommendations.**

## 2023-10-06 NOTE — Telephone Encounter (Signed)
 Patient read MyChart message   Last read by Jayson JONELLE Croak Rex at 8:23AM on 10/06/2023.

## 2023-10-06 NOTE — Telephone Encounter (Signed)
 Patient read message   Last read by Jayson JONELLE Croak Rex at 3:15PM on 10/01/2023.

## 2023-10-06 NOTE — Telephone Encounter (Signed)
 LVM and mychart message and letter sent

## 2023-10-15 ENCOUNTER — Encounter: Payer: Self-pay | Admitting: Cardiology

## 2023-10-16 ENCOUNTER — Ambulatory Visit (HOSPITAL_COMMUNITY)
Admission: RE | Admit: 2023-10-16 | Discharge: 2023-10-16 | Disposition: A | Discharge status: Home / Self Care | Source: Ambulatory Visit | Attending: Cardiology | Admitting: Cardiology

## 2023-10-16 DIAGNOSIS — Z8673 Personal history of transient ischemic attack (TIA), and cerebral infarction without residual deficits: Secondary | ICD-10-CM | POA: Insufficient documentation

## 2023-10-17 ENCOUNTER — Ambulatory Visit: Payer: Self-pay | Admitting: Cardiology

## 2023-10-22 ENCOUNTER — Ambulatory Visit
Admission: RE | Admit: 2023-10-22 | Discharge: 2023-10-22 | Disposition: A | Discharge status: Home / Self Care | Source: Ambulatory Visit | Attending: Gastroenterology | Admitting: Gastroenterology

## 2023-10-22 DIAGNOSIS — K581 Irritable bowel syndrome with constipation: Secondary | ICD-10-CM

## 2023-10-22 DIAGNOSIS — R1011 Right upper quadrant pain: Secondary | ICD-10-CM

## 2023-10-23 ENCOUNTER — Encounter: Payer: Self-pay | Admitting: Gastroenterology

## 2023-10-30 ENCOUNTER — Ambulatory Visit: Payer: Self-pay | Admitting: Gastroenterology

## 2023-12-02 ENCOUNTER — Encounter: Admitting: Gastroenterology

## 2023-12-22 ENCOUNTER — Other Ambulatory Visit: Payer: Self-pay | Admitting: Cardiology

## 2023-12-22 DIAGNOSIS — I480 Paroxysmal atrial fibrillation: Secondary | ICD-10-CM

## 2023-12-22 DIAGNOSIS — Z8673 Personal history of transient ischemic attack (TIA), and cerebral infarction without residual deficits: Secondary | ICD-10-CM

## 2024-01-20 ENCOUNTER — Other Ambulatory Visit (INDEPENDENT_AMBULATORY_CARE_PROVIDER_SITE_OTHER): Payer: Self-pay

## 2024-01-20 ENCOUNTER — Ambulatory Visit: Admitting: Orthopedic Surgery

## 2024-01-20 ENCOUNTER — Encounter: Payer: Self-pay | Admitting: Orthopedic Surgery

## 2024-01-20 VITALS — BP 122/70 | Ht 68.0 in | Wt 190.0 lb

## 2024-01-20 DIAGNOSIS — M722 Plantar fascial fibromatosis: Secondary | ICD-10-CM | POA: Diagnosis not present

## 2024-01-20 DIAGNOSIS — M720 Palmar fascial fibromatosis [Dupuytren]: Secondary | ICD-10-CM | POA: Diagnosis not present

## 2024-01-20 DIAGNOSIS — M79672 Pain in left foot: Secondary | ICD-10-CM

## 2024-01-20 NOTE — Progress Notes (Signed)
 Office Visit Note   Patient: Frank Silva           Date of Birth: [birth year]           MRN: [id number] Visit Date: 01/20/2024 Requested by: Renato Dorothey HERO, NP [id number]  [address] [place],  [place] [address] PCP: Renato Dorothey HERO, NP   Assessment & Plan:   Encounter Diagnoses  Name Primary?   Dupuytren contracture of right hand    Pain in left foot    Pain of left heel Yes    No orders of the defined types were placed in this encounter.   Dupuytren's contracture referral to hand specialist  Heel pain, start treatment with standard plantar fasciitis treatment  Tylenol  500 mg every 6 hours Heel cups Plantar fascia stretching Cryotherapy   Subjective: Chief Complaint  Patient presents with   Foot Pain    Left     HPI: 58 year old male presents with left foot pain history of plantar fasciitis has pain on the inferomedial portion of his heel no trauma.  He also has a plantar fibroma distally near the great toe on the medial band of the plantar fascia.  Patient reports no recent treatment              ROS: History of atrial fibrillation currently on Xarelto  history of GERD hyperlipidemia hypertension previous history of stroke   Images personally read and my interpretation : No outside imaging was presented  Internal imaging was performed DG Foot Complete Left Result Date: 01/20/2024 Images of the left foot plantar heel pain X-rays shows a small spur in the inferior portion of the calcaneus no arthritis is seen in the foot Other bony anatomy looks normal Impression normal left foot with small spur on the plantar aspect of the calcaneus     Visit Diagnoses:  1. Pain of left heel   2. Dupuytren contracture of right hand   3. Pain in left foot      Follow-Up Instructions: Return if symptoms worsen or fail to improve.    Objective: Vital Signs: BP 122/70 Comment: 09/29/23/ was elevated today in office pt states machine not accurate/ I agree  Ht 5' 8 (1.727  m)   Wt 190 lb (86.2 kg)   BMI 28.89 kg/m   Physical Exam General Exam seems normal  Ortho Exam Examination of the left foot there is normal plantar arch tenderness over the inferomedial portion of the heel and inferior portion of the heel he has a plantar fibroma distally in the medial band normal neurovascular function no instability of the ankle  He has a Dupuytren's contracture of the ring finger of his left hand manage  Specialty Comments:  No specialty comments available.  Imaging: DG Foot Complete Left Result Date: 01/20/2024 Images of the left foot plantar heel pain X-rays shows a small spur in the inferior portion of the calcaneus no arthritis is seen in the foot Other bony anatomy looks normal Impression normal left foot with small spur on the plantar aspect of the calcaneus     PMFS History: Patient Active Problem List   Diagnosis Date Noted   Dyspepsia 08/23/2019   Encounter for screening colonoscopy 08/23/2019   Chronic fatigue 11/08/2018   Reactive airway disease 11/08/2018   Right upper quadrant pain 11/08/2018   Tick bite 11/08/2018   Cellulitis of right upper extremity 08/03/2018   Plantar wart 08/03/2018   Erectile dysfunction due to arterial insufficiency 06/03/2018   Essential hypertension  02/03/2018   Hematuria 02/03/2018   Screen for colon cancer 01/19/2018   Pain of right hip joint 01/19/2018   Oral lesion 01/19/2018   Acute maxillary sinusitis 01/19/2018   Tobacco abuse 01/19/2018   PVD (peripheral vascular disease) 01/19/2018   Heme positive stool 01/19/2018   Epigastric pain 01/19/2018   History of CVA (cerebrovascular accident) 01/19/2018   Asymptomatic microscopic hematuria 01/19/2018   PAF (paroxysmal atrial fibrillation) (HCC) 05/22/2014   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   Stroke (HCC)    History of Dysrhythmia, cardiac 04/30/2014   Past Medical History:  Diagnosis Date   Allergy    Atrial fibrillation (HCC)    GERD  (gastroesophageal reflux disease)    Hyperlipidemia    Hypertension    IBS (irritable bowel syndrome)    Migraines    Neuromuscular disorder (HCC)    Stroke (HCC)    2014 or 2016    Family History  Problem Relation Age of Onset   CVA Father    Colon cancer Neg Hx    Colon polyps Neg Hx     Past Surgical History:  Procedure Laterality Date   CARDIAC CATHETERIZATION     age 72   Social History   Occupational History   Occupation: Truck hospital doctor   Occupation: truck hospital doctor  Tobacco Use   Smoking status: Former    Current packs/day: 1.00    Types: Cigarettes   Smokeless tobacco: Never  Vaping Use   Vaping status: Never Used  Substance and Sexual Activity   Alcohol use: No    Alcohol/week: 0.0 standard drinks of alcohol    Comment: none in 12 years   Drug use: No   Sexual activity: Yes

## 2024-01-20 NOTE — Patient Instructions (Signed)
 Tylenol  500 mg every 6 hours Heel cups Plantar fascia stretching Cryotherapy

## 2024-01-20 NOTE — Progress Notes (Signed)
  Intake history:  Chief Complaint  Patient presents with   Foot Pain    Left      BP 122/70 Comment: 09/29/23/ was elevated today in office pt states machine not accurate/ I agree  Ht 5' 8 (1.727 m)   Wt 190 lb (86.2 kg)   BMI 28.89 kg/m  Body mass index is 28.89 kg/m.  Pharmacy? ____[facility] __________________________________  WHAT ARE WE SEEING YOU FOR TODAY?   Left foot and right hand palm (? Dupetryns) also has heel pain left  How long has this bothered you? (DOI?DOS?WS?)  Long time, years  Was there an injury? No  Anticoag.  Yes   Any ALLERGIES _______ Allergies  Allergen Reactions   Flexeril  [Cyclobenzaprine ] Other (See Comments)    Heart race   Peanut-Containing Drug Products Hives and Shortness Of Breath   Benadryl [Diphenhydramine]     jittery   Grapefruit Bioflavonoid Complex Other (See Comments)    Interaction with medication   Other Other (See Comments)    Paprika, med interaction can throw heat out of rhythm    Prednisone  Other (See Comments)    Tremors   _______________________________________   Treatment:  Have you taken:  Tylenol  No  Advil No  Had PT No  Had injection No  Other  _________________________

## 2024-01-21 ENCOUNTER — Encounter: Payer: Self-pay | Admitting: Gastroenterology

## 2024-02-02 ENCOUNTER — Encounter: Admitting: Gastroenterology

## 2024-02-16 ENCOUNTER — Encounter: Payer: Self-pay | Admitting: Gastroenterology

## 2024-02-16 ENCOUNTER — Ambulatory Visit: Admitting: Gastroenterology

## 2024-02-16 VITALS — BP 160/90 | HR 66 | Ht 68.0 in | Wt 195.0 lb

## 2024-02-16 DIAGNOSIS — K581 Irritable bowel syndrome with constipation: Secondary | ICD-10-CM

## 2024-02-16 DIAGNOSIS — R1011 Right upper quadrant pain: Secondary | ICD-10-CM

## 2024-02-16 DIAGNOSIS — K760 Fatty (change of) liver, not elsewhere classified: Secondary | ICD-10-CM

## 2024-02-16 MED ORDER — OMEPRAZOLE 20 MG PO CPDR: 20.0000 mg | DELAYED_RELEASE_CAPSULE | Freq: Every day | ORAL | 0 refills | Status: AC

## 2024-02-16 NOTE — Progress Notes (Signed)
 Chief Complaint: FU  Referring Provider:  Renato Dorothey HERO, NP      ASSESSMENT AND PLAN;   #1. RUQ pain. Nl CBC, CMP. US  neg except for fatty liver 10/2023. Had problems with protonix . ?etiology  #2. IBS-C. Needs CRC screening. Rare rectal bleeding.  #3. A Fib/CVA on Xeralto  #4.  Fatty liver with Nl LFTs  Plan: -CT AP with contrast -Omeprazole  20mg  po every day x 12 weeks -Hold off on EGD/colon as pt doesnot want to come off xeralto.  I have discussed with the patient that endoscopic procedures can be done while on anticoagulation-however, if polyps more than 1 cm found then we cannot take those off.  He wants to hold off currently. -Metamucil 1tbs po every day with 8oz water. -Stop all sodas. -Encouraged to lose wt. -Use IcyHot/heating pads to take care of musculoskeletal component. -Further workup if still with problems.    HPI:    Frank Silva is a 58 y.o. male  With A-fib/CVA 2016 on Xarelto  (Nl EF), HTN, COPD quit smoking, HLD  Doesnot want to go off Xeralto for any endoscopic procedures.  History of Present Illness  Frank Silva is a 58 year old male with a history of stroke and atrial fibrillation on Xarelto  who presents with persistent right upper abdominal pain. Wife was on phone -[place].  He experiences persistent right upper abdominal pain that is burning and pulling in nature, located at the rib area. The pain sometimes feels like a 'knotted wire' and worsens with movement, bending over, or lying on the affected side. It is also exacerbated by eating and relieved by pressure. Initially, he had severe sharp pains that were intermittent but have now become constant.  He was scheduled to have EGD/colonoscopy in November.  Patient canceled as he did not want to go off Xarelto  despite cardiac clearance to do so.  Ultrasound showed fatty liver.  He had normal LFTs. He has gained 30 pounds since quitting smoking in April. He experiences digestive  issues, including alternating diarrhea and constipation, and a history of diarrhea since childhood. He notes a bad smell during bowel movements, described as 'burnt rubber' or 'chemicals'.   He has a history of stroke in 2016 due to atrial fibrillation and high blood pressure, which occurred after he stopped taking diltiazem  and Xarelto . He experienced another stroke in June 2016 after missing a dose of Xarelto , resulting in left leg and arm weakness that took a year to recover.  He has a history of kidney stones and occasional hematuria, which he attributes to blood thinner use. He also mentions foot swelling and a bad bone spur, which limits his physical activity. He is currently taking Gaviscon for stomach issues and has previously taken Protonix , which caused palpitations.  Has been eating very well.  He reports a family history of irritable bowel syndrome, with his mother and some siblings affected. 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  He does not consume alcohol and has not done so for 15 years. He quit smoking recently and does not take NSAIDs. He drinks Coke and water, with Coke being his primary vice. No current heartburn or acid reflux, although he had acid reflux in the past.      Wt Readings from Last 3 Encounters:  02/16/24 195 lb (88.5 kg)  01/20/24 190 lb (86.2 kg)  09/29/23 183 lb 12.8  oz (83.4 kg)      Results LABS AST: 19 U/L ALT: 24 U/L Albumin: 3.9 g/dL Hemoglobin: 83.1 g/dL Mean Corpuscular Volume (MCV): 100 fL White Blood Cell count (WBC): 7.2 x 10^3/L Total cholesterol: 138 mg/dL   LABS Hb: 83.3 (94/71/7974)  RADIOLOGY Ultrasound: Hepatic steatosis, increased echogenicity consistent with chronic medical renal disease, no cholelithiasis, common bile duct 7 mm    Past Medical History:  Diagnosis Date   Allergy    Atrial fibrillation (HCC)    GERD (gastroesophageal  reflux disease)    Hyperlipidemia    Hypertension    IBS (irritable bowel syndrome)    Migraines    Neuromuscular disorder (HCC)    Stroke (HCC)    2014 or 2016    Past Surgical History:  Procedure Laterality Date   CARDIAC CATHETERIZATION     age 63    Family History  Problem Relation Age of Onset   CVA Father    Colon cancer Neg Hx    Colon polyps Neg Hx     Social History   Tobacco Use   Smoking status: Former    Current packs/day: 1.00    Types: Cigarettes   Smokeless tobacco: Never  Vaping Use   Vaping status: Never Used  Substance Use Topics   Alcohol use: No    Alcohol/week: 0.0 standard drinks of alcohol    Comment: none in 12 years   Drug use: No    Current Outpatient Medications  Medication Sig Dispense Refill   albuterol  (VENTOLIN  HFA) 108 (90 Base) MCG/ACT inhaler INHALE 2 PUFFS BY MOUTH EVERY 6 HOURS AS NEEDED FOR WHEEZING 9 g 2   atorvastatin  (LIPITOR) 10 MG tablet Take 1 tablet (10 mg total) by mouth daily. 90 tablet 3   diltiazem  (CARDIZEM  CD) 360 MG 24 hr capsule Take 1 capsule by mouth once daily 90 capsule 3   hydrochlorothiazide  (HYDRODIURIL ) 25 MG tablet Take 1 tablet (25 mg total) by mouth daily. 90 tablet 3   loratadine (CLARITIN) 10 MG tablet Take 10 mg by mouth daily.     rivaroxaban  (XARELTO ) 20 MG TABS tablet Take 1 tablet (20 mg total) by mouth daily. 90 tablet 3   Na Sulfate-K Sulfate-Mg Sulf 17.5-3.13-1.6 GM/177ML SOLN As directed (Patient not taking: Reported on 02/16/2024) 354 mL 0   No current facility-administered medications for this visit.    Allergies  Allergen Reactions   Flexeril  [Cyclobenzaprine ] Other (See Comments)    Heart race   Peanut-Containing Drug Products Hives and Shortness Of Breath   Benadryl [Diphenhydramine]     jittery   Grapefruit Bioflavonoid Complex Other (See Comments)    Interaction with medication   Other Other (See Comments)    Paprika, med interaction can throw heat out of rhythm    Prednisone   Other (See Comments)    Tremors    Review of Systems:  neg     Physical Exam:    BP (!) 160/90   Pulse 66   Ht 5' 8 (1.727 m)   Wt 195 lb (88.5 kg)   BMI 29.65 kg/m  Wt Readings from Last 3 Encounters:  02/16/24 195 lb (88.5 kg)  01/20/24 190 lb (86.2 kg)  09/29/23 183 lb 12.8 oz (83.4 kg)   Constitutional:  Well-developed, in no acute distress. Psychiatric: Normal mood and affect. Behavior is normal. HEENT: Pupils normal.  Conjunctivae are normal. No scleral icterus. Cardiovascular: Normal rate, regular rhythm. No edema Pulmonary/chest: Effort normal and breath sounds normal. No  wheezing, rales or rhonchi. Abdominal: Soft, nondistended.  Right upper quadrant/right rib tenderness, bowel sounds active throughout. There are no masses palpable. No hepatomegaly. Rectal: Deferred.  Performed at the time of colonoscopy. Neurological: Alert and oriented to person place and time. Skin: Skin is warm and dry. No rashes noted.  Data Reviewed: I have personally reviewed following labs and imaging studies  CBC:    Latest Ref Rng & Units 06/06/2021    8:03 AM 12/28/2019   12:43 PM 11/19/2018    1:43 AM  CBC  WBC 3.4 - 10.8 x10E3/uL 7.7  9.3  9.8   Hemoglobin 13.0 - 17.7 g/dL 83.0  83.2  83.3   Hematocrit 37.5 - 51.0 % 50.2  47.6  47.7   Platelets 150 - 450 x10E3/uL 233  222  230     CMP:    Latest Ref Rng & Units 10/21/2021    3:21 PM 06/06/2021    8:03 AM 12/28/2019   12:43 PM  CMP  Glucose 70 - 99 mg/dL 96  96  892   BUN 6 - 24 mg/dL 18  14  15    Creatinine 0.76 - 1.27 mg/dL 9.00  8.86  8.94   Sodium 134 - 144 mmol/L 143  142  134   Potassium 3.5 - 5.2 mmol/L 3.6  3.8  3.1   Chloride 96 - 106 mmol/L 101  101  100   CO2 20 - 29 mmol/L 27  28  26    Calcium  8.7 - 10.2 mg/dL 9.7  9.5  9.0   Total Protein 6.0 - 8.5 g/dL  6.7    Total Bilirubin 0.0 - 1.2 mg/dL  0.4    Alkaline Phos 44 - 121 IU/L  70    AST 0 - 40 IU/L  18    ALT 0 - 44 IU/L  22         Anselm Bring, MD  02/16/2024, 2:46 PM  Cc: Renato Dorothey HERO, NP

## 2024-02-16 NOTE — Patient Instructions (Signed)
 _______________________________________________________  If your blood pressure at your visit was 140/90 or greater, please contact your primary care physician to follow up on this.  _______________________________________________________  If you are age 60 or older, your body mass index should be between 23-30. Your Body mass index is 29.65 kg/m. If this is out of the aforementioned range listed, please consider follow up with your Primary Care Provider.  If you are age 50 or younger, your body mass index should be between 19-25. Your Body mass index is 29.65 kg/m. If this is out of the aformentioned range listed, please consider follow up with your Primary Care Provider.   ________________________________________________________  The [facility] providers would like to encourage you to use MYCHART to communicate with providers for non-urgent requests or questions.  Due to long hold times on the telephone, sending your provider a message by [facility] may be a faster and more efficient way to get a response.  Please allow 48 business hours for a response.  Please remember that this is for non-urgent requests.  _______________________________________________________  [facility] is using a team-based approach to care.  Your team is made up of your doctor and two to three APPS. Our APPS (Nurse Practitioners and Physician Assistants) work with your physician to ensure care continuity for you. They are fully qualified to address your health concerns and develop a treatment plan. They communicate directly with your gastroenterologist to care for you. Seeing the Advanced Practice Practitioners on your physician's team can help you by facilitating care more promptly, often allowing for earlier appointments, access to diagnostic testing, procedures, and other specialty referrals.   We have sent the following medications to your pharmacy for you to pick up at your convenience: Omeprazole  20mg   daily for 3 months  Please purchase the following medications over the counter and take as directed: Metamucil 1 teaspoon daily in 8oz of water  Stop all soda.  You have been scheduled for a CT scan of the abdomen and pelvis at  [facility] at [facility] [address], [place], [place] [address]).   You are scheduled on 03-08-24 at 230pm. You should arrive at 1215pm your appointment time for registration. Please follow the written instructions below on the day of your exam  WARNING: IF YOU ARE ALLERGIC TO IODINE/X-RAY DYE, PLEASE NOTIFY RADIOLOGY IMMEDIATELY AT [id number]! YOU WILL BE GIVEN A 13 HOUR PREMEDICATION PREP.  1) Do not eat after 1230pm (2 hours prior to your test)  2) You will be given 2 bottles of oral contrast to drink.    Drink 1 bottle of contrast @ 1230pm (2 hours prior to your exam)  Drink 1 bottle of contrast @ 130pm (1 hour prior to your exam)  You may take any medications as prescribed with a small amount of water, if necessary. If you take any of the following medications: METFORMIN, GLUCOPHAGE, GLUCOVANCE, AVANDAMET, RIOMET, FORTAMET, ACTOPLUS MET, JANUMET, GLUMETZA or METAGLIP, you MAY be asked to HOLD this medication 48 hours AFTER the exam.  The purpose of you drinking the oral contrast is to aid in the visualization of your intestinal tract. The contrast solution may cause some diarrhea. Depending on your individual set of symptoms, you may also receive an intravenous injection of x-ray contrast/dye. Plan on being at [facility] for 30 minutes or longer, depending on the type of exam you are having performed.  This test typically takes 30-45 minutes to complete.  If you have any questions regarding your  exam or if you need to reschedule, you may call the CT department at [phone] between the hours of 8:00 am and 5:00 pm, Monday-Friday.  ________________________________________________________________________

## 2024-03-07 ENCOUNTER — Telehealth: Payer: Self-pay | Admitting: Gastroenterology

## 2024-03-07 ENCOUNTER — Encounter: Payer: Self-pay | Admitting: Gastroenterology

## 2024-03-07 NOTE — Telephone Encounter (Signed)
 Pt stated that he received a phone call on Friday stating that in regard to his CT scan that he was going to have to pay 600 dollars out of pocket.  Pt requested that the CT scan be done at [facility]. Radiology was notified and CT canceled. Pt to contact [facility]. Phone number provided.  Pt verbalized understanding with all questions answered.

## 2024-03-07 NOTE — Telephone Encounter (Signed)
 Pt called about upcoming CT contrast. Pt stated the out of pocket cost is 600 and wants to schedule with another facility. Pt also left message with details. Please advise thank you

## 2024-03-08 ENCOUNTER — Ambulatory Visit (HOSPITAL_BASED_OUTPATIENT_CLINIC_OR_DEPARTMENT_OTHER)

## 2024-04-05 ENCOUNTER — Encounter: Payer: Self-pay | Admitting: Gastroenterology

## 2024-04-07 ENCOUNTER — Inpatient Hospital Stay: Admission: RE | Admit: 2024-04-07 | Source: Ambulatory Visit

## 2024-04-13 ENCOUNTER — Ambulatory Visit: Admitting: Orthopedic Surgery

## 2024-04-13 DIAGNOSIS — M720 Palmar fascial fibromatosis [Dupuytren]: Secondary | ICD-10-CM | POA: Diagnosis not present

## 2024-05-02 ENCOUNTER — Other Ambulatory Visit
